# Patient Record
Sex: Male | Born: 1945 | Race: White | Hispanic: No | Smoking: Former smoker
Health system: Southern US, Community
[De-identification: ages and names within clinical notes are randomized; demographics above are authoritative.]

## PROBLEM LIST (undated history)

## (undated) DIAGNOSIS — I351 Nonrheumatic aortic (valve) insufficiency: Secondary | ICD-10-CM

## (undated) DIAGNOSIS — K219 Gastro-esophageal reflux disease without esophagitis: Secondary | ICD-10-CM

## (undated) DIAGNOSIS — K449 Diaphragmatic hernia without obstruction or gangrene: Secondary | ICD-10-CM

## (undated) DIAGNOSIS — I10 Essential (primary) hypertension: Secondary | ICD-10-CM

## (undated) DIAGNOSIS — R011 Cardiac murmur, unspecified: Secondary | ICD-10-CM

## (undated) DIAGNOSIS — F419 Anxiety disorder, unspecified: Secondary | ICD-10-CM

## (undated) DIAGNOSIS — M1990 Unspecified osteoarthritis, unspecified site: Secondary | ICD-10-CM

## (undated) DIAGNOSIS — E785 Hyperlipidemia, unspecified: Secondary | ICD-10-CM

## (undated) DIAGNOSIS — K222 Esophageal obstruction: Secondary | ICD-10-CM

## (undated) DIAGNOSIS — F32A Depression, unspecified: Secondary | ICD-10-CM

## (undated) HISTORY — DX: Depression, unspecified: F32.A

## (undated) HISTORY — PX: UPPER GASTROINTESTINAL ENDOSCOPY: SHX188

## (undated) HISTORY — DX: Diaphragmatic hernia without obstruction or gangrene: K44.9

## (undated) HISTORY — DX: Unspecified osteoarthritis, unspecified site: M19.90

## (undated) HISTORY — PX: COLON SURGERY: SHX602

## (undated) HISTORY — DX: Anxiety disorder, unspecified: F41.9

## (undated) HISTORY — DX: Esophageal obstruction: K22.2

## (undated) HISTORY — DX: Hyperlipidemia, unspecified: E78.5

## (undated) HISTORY — DX: Cardiac murmur, unspecified: R01.1

## (undated) HISTORY — DX: Essential (primary) hypertension: I10

## (undated) HISTORY — PX: POLYPECTOMY: SHX149

## (undated) HISTORY — DX: Gastro-esophageal reflux disease without esophagitis: K21.9

---
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
## 1995-07-05 HISTORY — PX: OTHER SURGICAL HISTORY: SHX169

## 2011-03-15 LAB — BASIC METABOLIC PANEL
Creatinine: 1 mg/dL (ref 0.6–1.3)
Glucose: 79 mg/dL

## 2011-03-15 LAB — CBC AND DIFFERENTIAL
Hemoglobin: 15.4 g/dL (ref 13.5–17.5)
Platelets: 144 10*3/uL — AB (ref 150–399)
WBC: 6.2 10^3/mL

## 2011-03-15 LAB — LIPID PANEL
LDL Cholesterol: 173 mg/dL
Triglycerides: 91 mg/dL (ref 40–160)

## 2011-03-15 LAB — PSA: PSA: 3.2

## 2011-09-30 DIAGNOSIS — H43819 Vitreous degeneration, unspecified eye: Secondary | ICD-10-CM | POA: Diagnosis not present

## 2011-11-07 DIAGNOSIS — L255 Unspecified contact dermatitis due to plants, except food: Secondary | ICD-10-CM | POA: Diagnosis not present

## 2011-11-24 DIAGNOSIS — H43819 Vitreous degeneration, unspecified eye: Secondary | ICD-10-CM | POA: Diagnosis not present

## 2012-06-18 ENCOUNTER — Encounter: Payer: Self-pay | Admitting: Family Medicine

## 2012-06-18 ENCOUNTER — Ambulatory Visit (INDEPENDENT_AMBULATORY_CARE_PROVIDER_SITE_OTHER): Payer: PRIVATE HEALTH INSURANCE | Admitting: Family Medicine

## 2012-06-18 VITALS — BP 153/78 | HR 63 | Ht 70.0 in | Wt 173.0 lb

## 2012-06-18 DIAGNOSIS — E785 Hyperlipidemia, unspecified: Secondary | ICD-10-CM

## 2012-06-18 DIAGNOSIS — R259 Unspecified abnormal involuntary movements: Secondary | ICD-10-CM | POA: Diagnosis not present

## 2012-06-18 DIAGNOSIS — R030 Elevated blood-pressure reading, without diagnosis of hypertension: Secondary | ICD-10-CM

## 2012-06-18 DIAGNOSIS — IMO0001 Reserved for inherently not codable concepts without codable children: Secondary | ICD-10-CM

## 2012-06-18 DIAGNOSIS — R51 Headache: Secondary | ICD-10-CM | POA: Insufficient documentation

## 2012-06-18 DIAGNOSIS — R519 Headache, unspecified: Secondary | ICD-10-CM | POA: Insufficient documentation

## 2012-06-18 DIAGNOSIS — K224 Dyskinesia of esophagus: Secondary | ICD-10-CM

## 2012-06-18 DIAGNOSIS — R252 Cramp and spasm: Secondary | ICD-10-CM | POA: Insufficient documentation

## 2012-06-18 NOTE — Progress Notes (Signed)
CC: Nicholas Murillo is a 66 y.o. male is here for Establish Care   Subjective: HPI:  Sigmoid 2012 Normal PSA 03/2011 Normal  Very pleasant 66 year old here to establish care  Carries a diagnosis of hyperlipidemia: He is unsure of his last lipid panel results but it's been over a year. Tries to watch what he eats, stays very active, denies chest pain, shortness of breath, or limb claudication  Has been told in the past that his blood pressure has been elevated. He is unsure of prior numbers in the past. Has never been on antihypertensives. Denies motor or sensory disturbances, headaches, irregular heartbeat, orthopnea, nor peripheral edema  Complains of a tightness in his chest that occurs only when eating solids, almost with any solid that he eats. Never occurs with fluids. Describes it as a spasm subsides if he drinks a large amount of water quickly. Seems to be coming on more frequently and more noticeable over the past few months. No interventions as of yet .  denies unintentional weight loss, vomiting, nor regurgitation.  Complains of posterior neck pain just below the base of the skull.  Hard to describe how often it occurs. Comes on without warning and lasts for 30 minutes, goes away without any intervention. It is a sharp pain is nonradiating and no larger than the pad of his finger. No other symptoms proceed joint or follow this discomfort. He is unsure how long it's been there  Complains of long-standing cramping in his hands and the medial thighs. Nothing in particular seems to proceed these episodes. The last for matter of minutes. Nothing seems to make them better or worse. No interventions as of yet. Pain is moderate in severity. Occur a few times a week, not daily.   Review of Systems - General ROS: negative for - chills, fever, night sweats, weight gain or weight loss Ophthalmic ROS: negative for - decreased vision Psychological ROS: negative for - anxiety or depression ENT ROS:  negative for - hearing change, nasal congestion, tinnitus or allergies Hematological and Lymphatic ROS: negative for - bleeding problems, bruising or swollen lymph nodes Breast ROS: negative Respiratory ROS: no cough, shortness of breath, or wheezing Cardiovascular ROS: no chest pain or dyspnea on exertion Gastrointestinal ROS: no abdominal pain, change in bowel habits, or black or bloody stools Genito-Urinary ROS: negative for - genital discharge, genital ulcers, incontinence or abnormal bleeding from genitals Musculoskeletal ROS: negative for - joint pain or muscle pain other than that described above Neurological ROS: negative for - headaches or memory loss Dermatological ROS: negative for lumps, mole changes, rash and skin lesion changes  Past Medical History  Diagnosis Date  . Hyperlipidemia      Family History  Problem Relation Age of Onset  . Skin cancer Father      History  Substance Use Topics  . Smoking status: Never Smoker   . Smokeless tobacco: Not on file  . Alcohol Use: No     Objective: Filed Vitals:   06/18/12 1422  BP: 153/78  Pulse: 63    General: Alert and Oriented, No Acute Distress HEENT: Pupils equal, round, reactive to light. Conjunctivae clear.  External ears unremarkable, canals clear with intact TMs with appropriate landmarks.  Middle ear appears open without effusion. Pink inferior turbinates.  Moist mucous membranes, pharynx without inflammation nor lesions.  Neck supple without palpable lymphadenopathy nor abnormal masses. Lungs: Clear to auscultation bilaterally, no wheezing/ronchi/rales.  Comfortable work of breathing. Good air movement. Cardiac: Regular rate and  rhythm. Normal S1/S2.  No murmurs, rubs, nor gallops.   Abdomen: Soft nontender to palpation Extremities: No peripheral edema.  Strong peripheral pulses.  Mental Status: No depression, anxiety, nor agitation. Skin: Warm and dry.  Assessment & Plan: Nicholas Murillo was seen today for establish  care.  Diagnoses and associated orders for this visit:  Hyperlipidemia - Lipid panel  Elevated blood pressure - COMPLETE METABOLIC PANEL WITH GFR  Headache - COMPLETE METABOLIC PANEL WITH GFR  Esophageal spasm - DG Esophagus; Future  Spasm - COMPLETE METABOLIC PANEL WITH GFR    Hyperlipidemia: Getting up-to-date lipid panel Elevated blood pressure: Checking renal function, if elevated next visit we'll discuss antihypertensive medications. Discussed lowering salt intake Headache and muscle spasms: Checking albumin and calcium to rule out calcium disorders as a contributing factor, checking other electrolytes as well. Esophageal spasm: Ordering barium swallow   Return in about 4 weeks (around 07/16/2012) for CPE.

## 2012-06-21 DIAGNOSIS — E785 Hyperlipidemia, unspecified: Secondary | ICD-10-CM | POA: Diagnosis not present

## 2012-06-21 LAB — LIPID PANEL
Cholesterol: 233 mg/dL — ABNORMAL HIGH (ref 0–200)
HDL: 38 mg/dL — ABNORMAL LOW (ref >39)
LDL Cholesterol: 180 mg/dL — ABNORMAL HIGH (ref 0–99)
Total CHOL/HDL Ratio: 6.1 Ratio
Triglycerides: 73 mg/dL (ref <150)
VLDL: 15 mg/dL (ref 0–40)

## 2012-06-21 LAB — COMPLETE METABOLIC PANEL WITH GFR
ALT: 19 U/L (ref 0–53)
AST: 22 U/L (ref 0–37)
Alkaline Phosphatase: 63 U/L (ref 39–117)
BUN: 24 mg/dL — ABNORMAL HIGH (ref 6–23)
Chloride: 106 mEq/L (ref 96–112)
Creat: 0.93 mg/dL (ref 0.50–1.35)
Total Bilirubin: 0.6 mg/dL (ref 0.3–1.2)

## 2012-06-22 ENCOUNTER — Encounter: Payer: Self-pay | Admitting: Family Medicine

## 2012-07-03 ENCOUNTER — Other Ambulatory Visit (HOSPITAL_COMMUNITY): Payer: PRIVATE HEALTH INSURANCE

## 2012-07-05 ENCOUNTER — Encounter: Payer: Self-pay | Admitting: *Deleted

## 2012-07-06 ENCOUNTER — Other Ambulatory Visit (HOSPITAL_COMMUNITY): Payer: PRIVATE HEALTH INSURANCE

## 2012-07-10 ENCOUNTER — Telehealth: Payer: Self-pay | Admitting: Family Medicine

## 2012-07-10 ENCOUNTER — Ambulatory Visit (HOSPITAL_COMMUNITY)
Admission: RE | Admit: 2012-07-10 | Discharge: 2012-07-10 | Disposition: A | Discharge status: Home / Self Care | Payer: Medicare Other | Source: Ambulatory Visit | Attending: Family Medicine | Admitting: Family Medicine

## 2012-07-10 DIAGNOSIS — K449 Diaphragmatic hernia without obstruction or gangrene: Secondary | ICD-10-CM | POA: Insufficient documentation

## 2012-07-10 DIAGNOSIS — K224 Dyskinesia of esophagus: Secondary | ICD-10-CM

## 2012-07-10 DIAGNOSIS — K219 Gastro-esophageal reflux disease without esophagitis: Secondary | ICD-10-CM | POA: Insufficient documentation

## 2012-07-10 DIAGNOSIS — R1310 Dysphagia, unspecified: Secondary | ICD-10-CM | POA: Insufficient documentation

## 2012-07-10 DIAGNOSIS — K222 Esophageal obstruction: Secondary | ICD-10-CM | POA: Insufficient documentation

## 2012-07-10 IMAGING — RF DG ESOPHAGUS
14 of 24 series · 14 of 24 positions shown · non-contrast
Comparison: None.

CLINICAL DATA: Dysphagia with solids.

ESOPHOGRAM/BARIUM SWALLOW
TECHNIQUE: Combined double contrast and single contrast
examination performed using effervescent crystals, thick barium
liquid, and thin barium liquid.
Fluoroscopy time:  2.5 minutes.

[Series 1: run · 1 of 1 slices shown (1 of 14)]
[im 1/1]
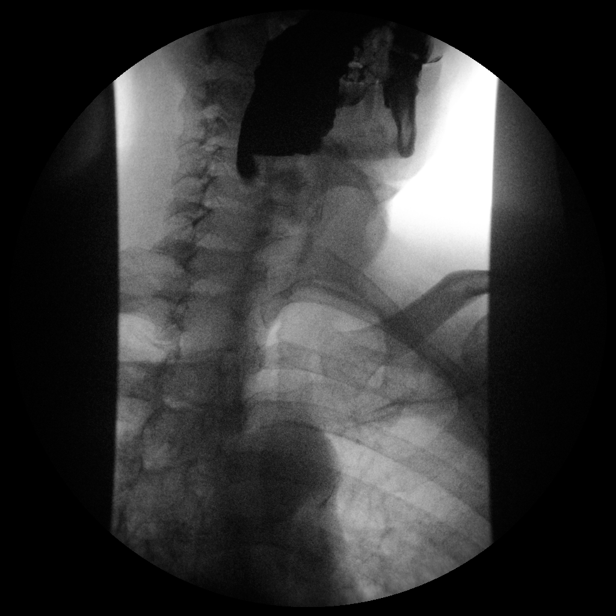

[Series 3: run · 1 of 1 slices shown (2 of 14)]
[im 1/1]
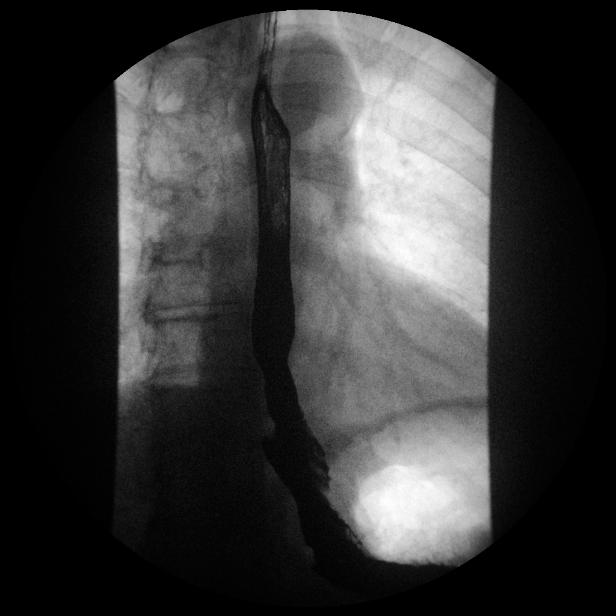

[Series 5: run · 1 of 1 slices shown (3 of 14)]
[im 1/1]
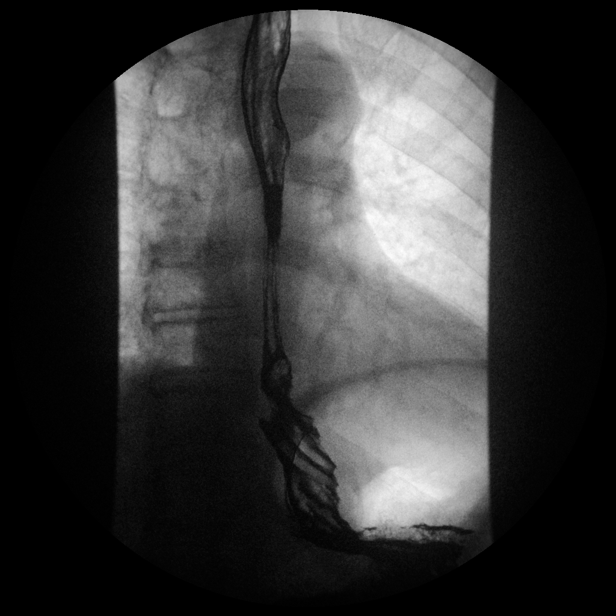

[Series 7: run · 1 of 1 slices shown (4 of 14)]
[im 1/1]
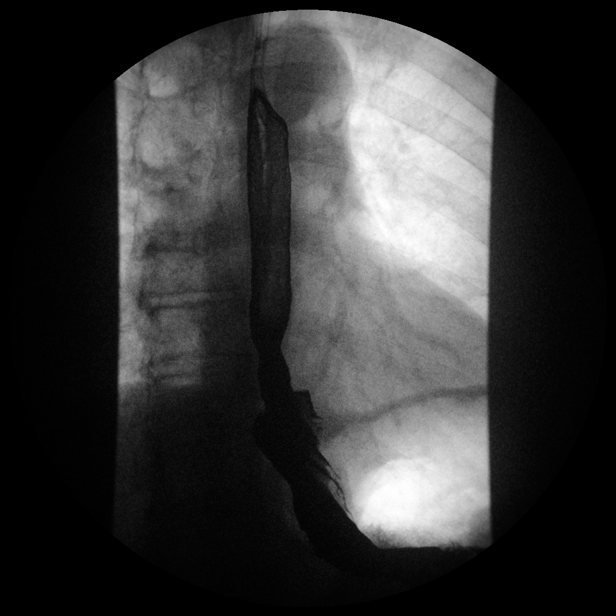

[Series 8: run · 1 of 1 slices shown (5 of 14)]
[im 1/1]
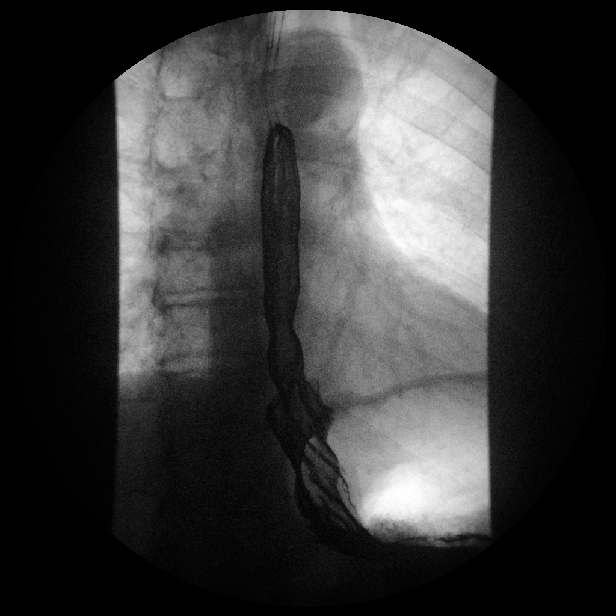

[Series 10: run · 1 of 1 slices shown (6 of 14)]
[im 1/1]
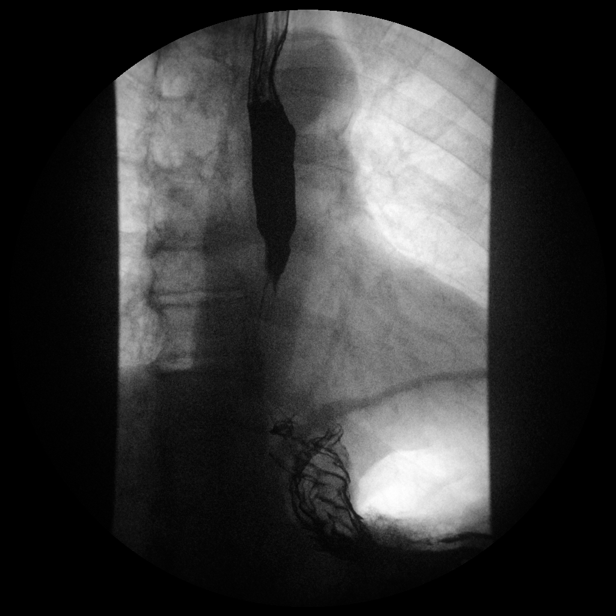

[Series 12: run · 1 of 3 slices shown (7 of 14)]
[im 1/3]
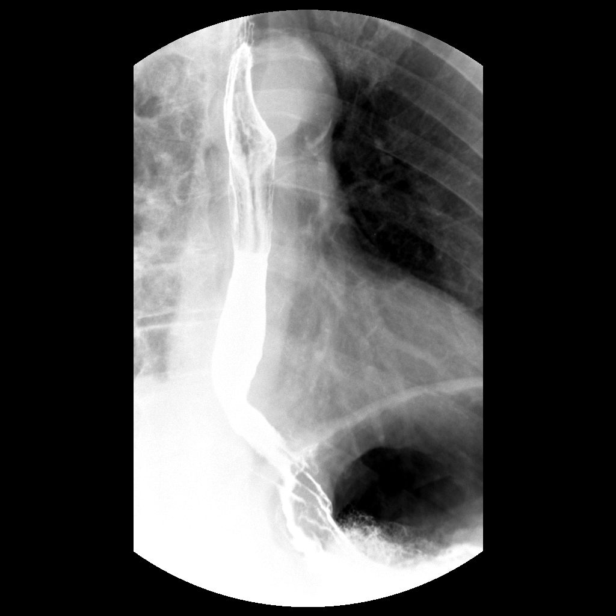

[Series 13: run · 1 of 3 slices shown (8 of 14)]
[im 1/3]
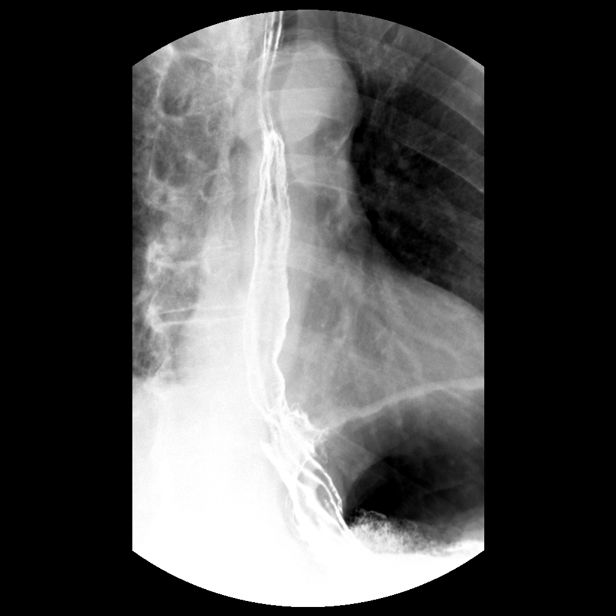

[Series 15: run · 1 of 1 slices shown (9 of 14)]
[im 1/1]
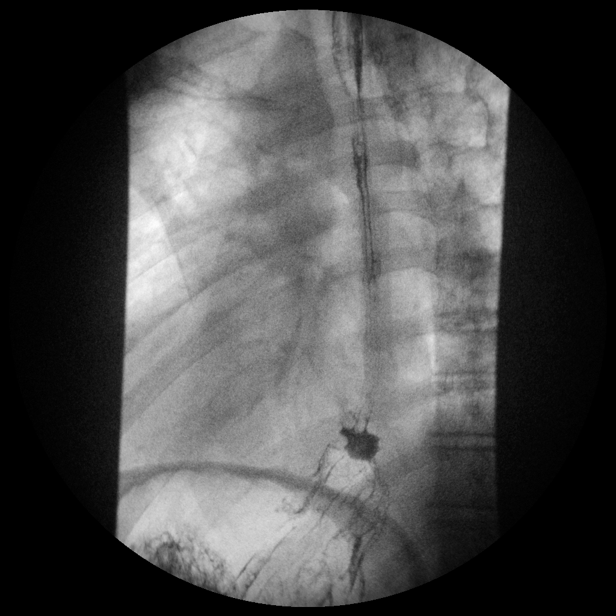

[Series 17: run · 1 of 1 slices shown (10 of 14)]
[im 1/1]
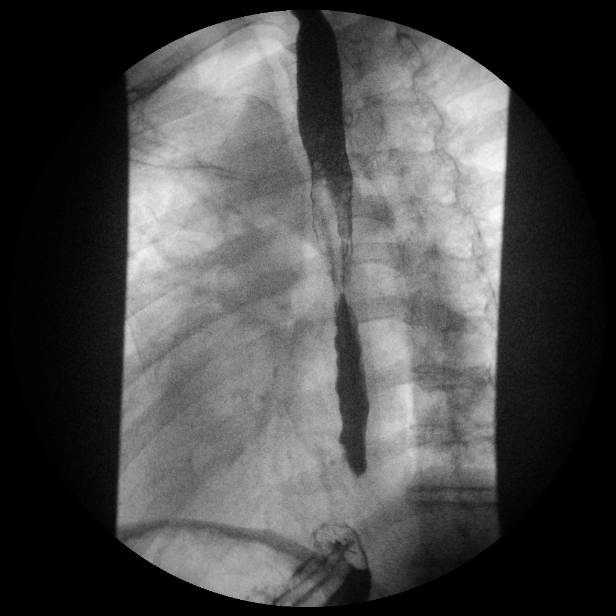

[Series 19: run · 1 of 1 slices shown (11 of 14)]
[im 1/1]
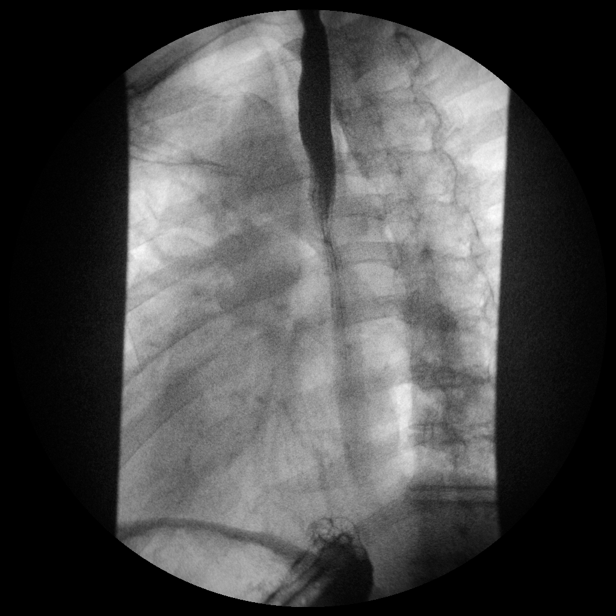

[Series 20: run · 1 of 1 slices shown (12 of 14)]
[im 1/1]
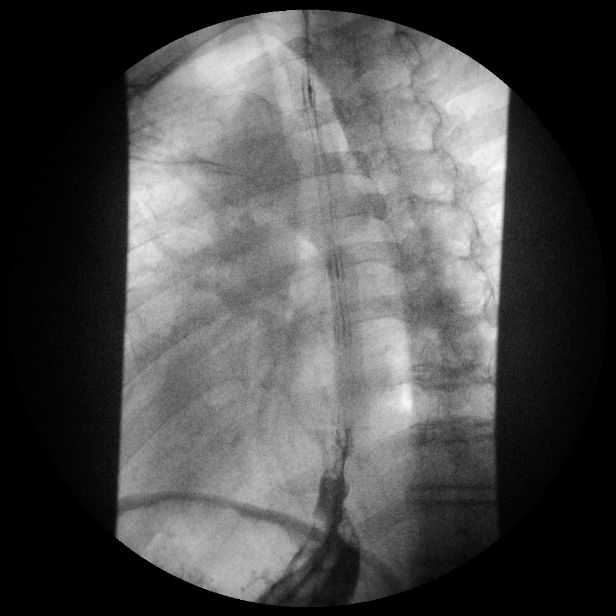

[Series 22: run · 1 of 3 slices shown (13 of 14)]
[im 1/3]
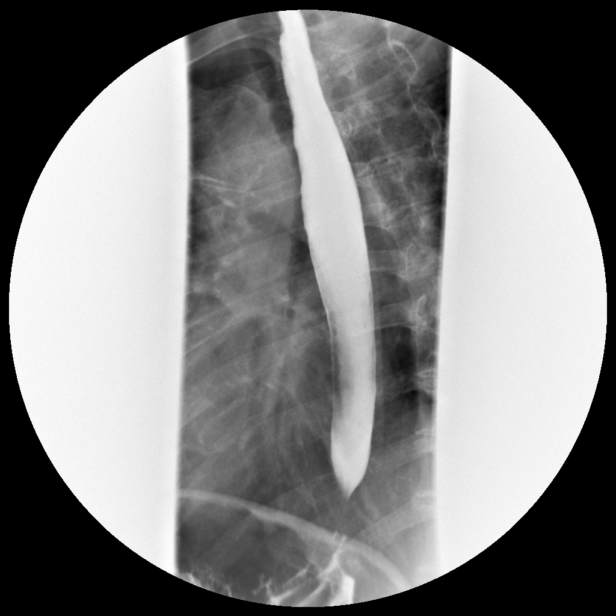

[Series 24: run · 1 of 1 slices shown (14 of 14)]
[im 1/1]
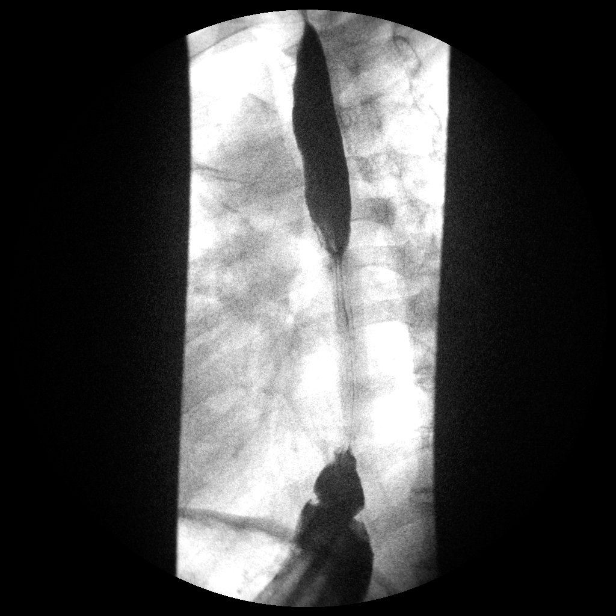

[14 of 24 positions shown; findings below may reference images not displayed]

FINDINGS: Fluoroscopic evaluation of swallowing demonstrates
disruption of [DATE] primary esophageal peristaltic waves.  Occasional
scattered tertiary contractions.

There is a small hiatal hernia.  Within the distal esophagus, there
is an area of mild short segment narrowing just above the hiatal
hernia.  Slight irregular contours and shouldering noted along the
distal aspect of the stricture.

A small amount of reflux was noted with water siphon maneuver.

The patient swallowed a 13 mm barium tablet.  This sticks for 2-3
minutes within the distal esophagus.
IMPRESSION: Small hiatal hernia.

Area of short segment stricture in the distal esophagus.  Question
reflux esophagitis/stricture.  Cannot completely exclude neoplasm.
Recommend direct visualization.

Nonspecific esophageal motility disorder.

Small amount of gastroesophageal reflux.

to Dr. CSIRMAZ, who verbally acknowledged these results.

## 2012-07-10 NOTE — Telephone Encounter (Signed)
Attempted to discuss results, no answer, will continue attempts.

## 2012-07-10 NOTE — Telephone Encounter (Signed)
Patient updated, referral placed

## 2012-07-12 ENCOUNTER — Telehealth: Payer: Self-pay | Admitting: Internal Medicine

## 2012-07-12 NOTE — Telephone Encounter (Signed)
Pts PCP has done some tests and pt may have possible esophageal stricture. Pt scheduled for OV with Amy Esterwood PA 07/16/12@10 :30am. Pt aware of appt date and time.

## 2012-07-13 ENCOUNTER — Ambulatory Visit: Payer: PRIVATE HEALTH INSURANCE | Admitting: Family Medicine

## 2012-07-13 ENCOUNTER — Encounter: Payer: Self-pay | Admitting: Family Medicine

## 2012-07-13 VITALS — BP 116/43 | HR 54 | Ht 70.0 in | Wt 175.0 lb

## 2012-07-13 DIAGNOSIS — IMO0001 Reserved for inherently not codable concepts without codable children: Secondary | ICD-10-CM

## 2012-07-13 DIAGNOSIS — E785 Hyperlipidemia, unspecified: Secondary | ICD-10-CM

## 2012-07-13 NOTE — Progress Notes (Signed)
Patient presents for annual physical exam, unfortunately this visit was scheduled incorrectly, not any fault of his own.  I wanted to talk to him about the following and he will not be charged for this visit due to scheduling error caused by her office   Hyperlipidemia: Framingham risk of 21%, LDL of 180 last week. He is very agreeable to starting a statin, he will look into what the cheapest statin is covered on his insurance plan and will call me to arrange a prescription.  Elevated blood pressure: Normotensive at this visit, he happily states that today's blood pressures reflection of his normal blood pressures  He complains of some cramping that continues in his medial hamstrings I've asked him to try magnesium supplementation,.  We briefly about his mother who has Alzheimer's and was recently placed in a nursing home, things are going much better now

## 2012-07-16 ENCOUNTER — Encounter: Payer: Self-pay | Admitting: Physician Assistant

## 2012-07-16 ENCOUNTER — Ambulatory Visit (INDEPENDENT_AMBULATORY_CARE_PROVIDER_SITE_OTHER): Payer: PRIVATE HEALTH INSURANCE | Admitting: Physician Assistant

## 2012-07-16 VITALS — BP 130/70 | HR 66 | Ht 68.0 in | Wt 173.4 lb

## 2012-07-16 DIAGNOSIS — R1310 Dysphagia, unspecified: Secondary | ICD-10-CM

## 2012-07-16 NOTE — Patient Instructions (Addendum)
You have been scheduled for an endoscopy with propofol. Please follow written instructions given to you at your visit today. If you use inhalers (even only as needed) or a CPAP machine, please bring them with you on the day of your procedure.  If this date for the Endoscopy needs changed, call us at [phone]. Choose option 2 for nurse and the party that answers can reschedule this for you.  We have given you brochures on Esophagitis and strictures and colonoscopy.

## 2012-07-16 NOTE — Progress Notes (Signed)
Agree with initial assessment and plans as outlined 

## 2012-07-16 NOTE — Progress Notes (Signed)
Subjective:    Patient ID: Nicholas Murillo, male    DOB: [birth year], 67 y.o.   MRN: [id number]  HPI Nicholas Murillo is a very nice generally healthy 67 year old white male who is referred by Dr. [name] for evaluation of intermittent dysphagia. Patient underwent barium swallow on 06/18/2012 and was found to have a short segment of narrowing just above a small hiatal hernia there was noted to be some slight shouldering and a barium tablet was delayed for about 3 minutes Patient has not had any prior endoscopic evaluation.  He says that he has had intermittent episodes over the past couple of years and usually states that these occur 6-8 times per year. He says he has had difficulty with meat and one time a banana, never with liquid. He says he swallows fine but on occasion will get a piece of food lodged in his esophagus, he says this is quite painful and he has had to induce regurgitation to try to relieve the obstruction. In between these episodes he is not having any difficulty swallowing. He does have intermittent heartburn and indigestion which he feels is aggravated by stress. He is not on any current PPI therapy He has not had screening colonoscopy, family history is negative for colon cancer and he is currently asymptomatic    Review of Systems  Constitutional: Negative.   HENT: Positive for trouble swallowing.   Eyes: Negative.   Respiratory: Negative.   Cardiovascular: Negative.   Gastrointestinal: Negative.   Genitourinary: Negative.   Musculoskeletal: Negative.   Skin: Negative.   Neurological: Negative.   Hematological: Negative.   Psychiatric/Behavioral: Negative.    Outpatient Prescriptions Prior to Visit  Medication Sig Dispense Refill  . magnesium oxide (MAG-OX) 400 MG tablet Take 800 mg by mouth daily.       Last reviewed on 07/16/2012  1:08 PM by Nicholas Cooper, PA     No Known Allergies Patient Active Problem List  Diagnosis  . Hyperlipidemia LDL goal < 100  . Elevated blood  pressure  . Headache  . Spasm  . Esophageal stricture  . Hiatal hernia   History  Substance Use Topics  . Smoking status: Never Smoker   . Smokeless tobacco: Never Used  . Alcohol Use: No     Comment: quit 20 years ago    family history includes Colon polyps in his mother; Dementia in his mother; and Skin cancer in his father.  There is no history of Colon cancer.    Physical Exam Well-developed white male in no acute distress, pleasant blood pressure 130/70 pulse 66 height 5 foot 8 weeks 173. HEENT; nontraumatic normocephalic EOMI PERRLA sclera anicteric,Neck; Supple; no JVD, Cardiovascula; regular rate and rhythm with S1-S2 no murmur or gallop, Pulmonary; clear bilaterally, Abdomen; soft nontender nondistended bowel sounds active no palpable mass or hepatosplenomegaly, Rectal; exam not done, Extremities; no clubbing cyanosis or edema skin warm dry, Psych; mood and affect normal and appropriate       Assessment & Plan:  #66 67 year old male with intermittent solid food dysphagia and barium swallow consistent with a distal esophageal stricture. #2 colon neoplasia surveillance-patient has not had screening #3 history of hyperlipidemia  Plan; Patient is scheduled for upper endoscopy with probable Savary dilation with Dr. [name]. Procedure was discussed in detail with the patient and he is agreeable to proceed. He may benefit from PPI therapy but will wait for results of EGD, and can start at that time Also discussed benefits of screening colonoscopy. He states  that he is scheduled for a  Physical within the next few weeks will discuss with his primary care physician and then plan to call back here for direct scheduling

## 2012-07-18 ENCOUNTER — Encounter: Payer: Self-pay | Admitting: Family Medicine

## 2012-07-18 ENCOUNTER — Ambulatory Visit (INDEPENDENT_AMBULATORY_CARE_PROVIDER_SITE_OTHER): Payer: Medicare Other | Admitting: Family Medicine

## 2012-07-18 VITALS — BP 120/63 | HR 65 | Ht 68.0 in | Wt 173.0 lb

## 2012-07-18 DIAGNOSIS — Z23 Encounter for immunization: Secondary | ICD-10-CM

## 2012-07-18 DIAGNOSIS — Z0000 Encounter for general adult medical examination without abnormal findings: Secondary | ICD-10-CM

## 2012-07-18 MED ORDER — ZOSTER VACCINE LIVE 19400 UNT/0.65ML ~~LOC~~ SOLR: 0.6500 mL | Freq: Once | SUBCUTANEOUS | Status: DC

## 2012-07-18 MED ORDER — SIMVASTATIN 20 MG PO TABS: 20.0000 mg | ORAL_TABLET | Freq: Every evening | ORAL | Status: DC

## 2012-07-18 NOTE — Progress Notes (Signed)
Subjective:    Nicholas Murillo is a 67 y.o. male who presents for Medicare Annual/Subsequent preventive examination.   Preventive Screening-Counseling & Management  Tobacco History  Smoking status  . Never Smoker   Smokeless tobacco  . Never Used    Problems Prior to Visit 1. hyperlipidemia, esophageal stricture  Current Problems (verified) Patient Active Problem List  Diagnosis  . Hyperlipidemia LDL goal < 100  . Elevated blood pressure  . Headache  . Spasm  . Esophageal stricture  . Hiatal hernia    Medications Prior to Visit Current Outpatient Prescriptions on File Prior to Visit  Medication Sig Dispense Refill  . magnesium oxide (MAG-OX) 400 MG tablet Take 800 mg by mouth daily.      . simvastatin (ZOCOR) 20 MG tablet Take 1 tablet (20 mg total) by mouth every evening.  90 tablet  3    Current Medications (verified) Current Outpatient Prescriptions  Medication Sig Dispense Refill  . magnesium oxide (MAG-OX) 400 MG tablet Take 800 mg by mouth daily.      . simvastatin (ZOCOR) 20 MG tablet Take 1 tablet (20 mg total) by mouth every evening.  90 tablet  3  . zoster vaccine live, PF, (ZOSTAVAX) 16109 UNT/0.65ML injection Inject 19,400 Units into the skin once.  1 each  0  . zoster vaccine live, PF, (ZOSTAVAX) 60454 UNT/0.65ML injection Inject 19,400 Units into the skin once.  1 each  0     Allergies (verified) Review of patient's allergies indicates no known allergies.   PAST HISTORY  Family History Family History  Problem Relation Age of Onset  . Skin cancer Father   . Colon polyps Mother     benign  . Colon cancer Neg Hx   . Dementia Mother     Social History History  Substance Use Topics  . Smoking status: Never Smoker   . Smokeless tobacco: Never Used  . Alcohol Use: No     Comment: quit 20 years ago    Are there smokers in your home (other than you)?  No  Risk Factors Current exercise habits: Home exercise routine includes Walking.  Dietary  issues discussed: Low-salt diet with moderation of fat and cholesterol intake with benefits of fruits and vegetables included every meal  Cardiac risk factors: advanced age (older than 88 for men, 33 for women), dyslipidemia and male gender.  Depression Screen (Note: if answer to either of the following is "Yes", a more complete depression screening is indicated)   Q1: Over the past two weeks, have you felt down, depressed or hopeless? No  Q2: Over the past two weeks, have you felt little interest or pleasure in doing things? No  Have you lost interest or pleasure in daily life? No  Do you often feel hopeless? No  Do you cry easily over simple problems? No  Activities of Daily Living In your present state of health, do you have any difficulty performing the following activities?:  Driving? No Managing money?  No Feeding yourself? No Getting from bed to chair? No Climbing a flight of stairs? No Preparing food and eating?: No Bathing or showering? No Getting dressed: No Getting to the toilet? No Using the toilet:No Moving around from place to place: No In the past year have you fallen or had a near fall?:No   Are you sexually active?  Yes  Do you have more than one partner?  No  Hearing Difficulties: No Do you often ask people to speak up or  repeat themselves? No Do you experience ringing or noises in your ears? No Do you have difficulty understanding soft or whispered voices? No   Do you feel that you have a problem with memory? No  Do you often misplace items? Yes  Do you feel safe at home?  Yes  Cognitive Testing  Alert? Yes  Normal Appearance?Yes  Oriented to person? Yes  Place? Yes   Time? Yes  Recall of three objects?  Yes  Can perform simple calculations? Yes  Displays appropriate judgment?Yes  Can read the correct time from a watch face?Yes   Advanced Directives have been discussed with the patient? Yes   List the Names of Other Physician/Practitioners you  currently use: 1.  see snapshot, him he is to repeat a  Indicate any recent Medical Services you may have received from other than [facility] providers in the past year (date may be approximate).  Immunization History  Administered Date(s) Administered  . Pneumococcal Polysaccharide 07/18/2012   he believes he has had a tetanus booster well within the last 10 years  Screening Tests Health Maintenance  Topic Date Due  . Tetanus/tdap  11/13/1964  . Colonoscopy  11/14/1995  . Zostavax  11/13/2005  . Pneumococcal Polysaccharide Vaccine Age 92 And Over  11/14/2010  . Influenza Vaccine  03/04/2012    All answers were reviewed with the patient and necessary referrals were made:  Laren Boom, DO   07/18/2012   History reviewed: allergies, current medications, past family history, past medical history, past social history, past surgical history and problem list  Review of Systems Review of Systems - General ROS: negative for - chills, fever, night sweats, weight gain or weight loss Ophthalmic ROS: negative for - decreased vision Psychological ROS: negative for - anxiety or depression ENT ROS: negative for - hearing change, nasal congestion, tinnitus or allergies Hematological and Lymphatic ROS: negative for - bleeding problems, bruising or swollen lymph nodes Breast ROS: negative Respiratory ROS: no cough, shortness of breath, or wheezing Cardiovascular ROS: no chest pain or dyspnea on exertion Gastrointestinal ROS: no abdominal pain, change in bowel habits, or black or bloody stools Genito-Urinary ROS: negative for - genital discharge, genital ulcers, incontinence or abnormal bleeding from genitals Musculoskeletal ROS: negative for - joint pain or muscle pain Neurological ROS: negative for - headaches or memory loss Dermatological ROS: negative for lumps, mole changes, rash and skin lesion changes  Objective:     Vision by Snellen chart: right eye:20/20, left eye:20/20 Blood pressure 120/63,  pulse 65, height 5\' 8"  (1.727 m), weight 173 lb (78.472 kg). Body mass index is 26.30 kg/(m^2).  General: No Acute Distress HEENT: Atraumatic, normocephalic, conjunctivae normal without scleral icterus.  No nasal discharge, hearing grossly intact, TMs with good landmarks bilaterally with no middle ear abnormalities, posterior pharynx clear without oral lesions. Neck: Supple, trachea midline, no cervical nor supraclavicular adenopathy. Pulmonary: Clear to auscultation bilaterally without wheezing, rhonchi, nor rales. Cardiac: Regular rate and rhythm.  No murmurs, rubs, nor gallops. No peripheral edema.  2+ peripheral pulses bilaterally. Abdomen: Bowel sounds normal.  No masses.  Non-tender without rebound.  Negative Murphy's sign. MSK: Grossly intact, no signs of weakness.  Full strength throughout upper and lower extremities.  Full ROM in upper and lower extremities.  No midline spinal tenderness. Neuro: Gait unremarkable, CN II-XII grossly intact.  C5-C6 Reflex 2/4 Bilaterally, L4 Reflex 2/4 Bilaterally.  Cerebellar function intact. Skin: No rashes. Psych: Alert and oriented to person/place/time.  Thought process normal. No  anxiety/depression.     Assessment:     Normal physical exam, behind on immunizations     Plan:     During the course of the visit the patient was educated and counseled about appropriate screening and preventive services including:    Pneumococcal vaccine   Influenza vaccine  Nutrition counseling   Advanced directives: Patient was given handout on how to complete this with his wife Zostavax and influenza vaccine at his pharmacy Diet review for nutrition referral? Not indicated   Patient Instructions (the written plan) was given to the patient.  Medicare Attestation I have personally reviewed: The patient's medical and social history Their use of alcohol, tobacco or illicit drugs Their current medications and supplements The patient's functional  ability including ADLs,fall risks, home safety risks, cognitive, and hearing and visual impairment Diet and physical activities Evidence for depression or mood disorders  The patient's weight, height, BMI, and visual acuity have been recorded in the chart.  I have made referrals, counseling, and provided education to the patient based on review of the above and I have provided the patient with a written personalized care plan for preventive services.     Laren Boom, DO   07/18/2012

## 2012-07-18 NOTE — Patient Instructions (Signed)
Dr. [name]'s General Advice Following Your Complete Physical Exam  The Benefits of Regular Exercise: Unless you suffer from an uncontrolled cardiovascular condition, studies strongly suggest that regular exercise and physical activity will add to both the quality and length of your life.  The World Health Organization recommends 150 minutes of moderate intensity aerobic activity every week.  This is best split over 3-4 days a week, and can be as simple as a brisk walk for just over 35 minutes "most days of the week".  This type of exercise has been shown to lower LDL-Cholesterol, lower average blood sugars, lower blood pressure, lower cardiovascular disease risk, improve memory, and increase one's overall sense of wellbeing.  The addition of anaerobic (or "strength training") exercises offers additional benefits including but not limited to increased metabolism, prevention of osteoporosis, and improved overall cholesterol levels.  How Can I Strive For A Low-Fat Diet?: Current guidelines recommend that 25-35 percent of your daily energy (food) intake should come from fats.  One might ask how can this be achieved without having to dissect each meal on a daily basis?  Switch to skim or 1% milk instead of whole milk.  Focus on lean meats such as ground turkey, fresh fish, baked chicken, and lean cuts of beef as your source of dietary protein.  Consume less than 300mg/day of dietary cholesterol.  Limit trans fatty acid consumption primarily by limiting synthetic trans fats such as partially hydrogenated oils (Ex: fried fast foods).  Focus efforts on reducing your intake of "solid" fats (Ex: Butter).  Substitute olive or vegetable oil for solid fats where possible.  Moderation of Salt Intake: Provided you don't carry a diagnosis of congestive heart failure nor renal failure, I recommend a daily allowance of no more than 2300 mg of salt (sodium).  Keeping under this daily goal is associated with a  decreased risk of cardiovascular events, creeping above it can lead to elevated blood pressures and increases your risk of cardiovascular events.  Milligrams (mg) of salt is listed on all nutrition labels, and your daily intake can add up faster than you think.  Most canned and frozen dinners can pack in over half your daily salt allowance in one meal.    Lifestyle Health Risks: Certain lifestyle choices carry specific health risks.  As you may already know, tobacco use has been associated with increasing one's risk of cardiovascular disease, pulmonary disease, numerous cancers, among many other issues.  What you may not know is that there are medications and nicotine replacement strategies that can more than double your chances of successfully quitting.  I would be thrilled to help manage your quitting strategy if you currently use tobacco products.  When it comes to alcohol use, I've yet to find an "ideal" daily allowance.  Provided an individual does not have a medical condition that is exacerbated by alcohol consumption, general guidelines determine "safe drinking" as no more than two standard drinks for a man or no more than one standard drink for a male per day.  However, much debate still exists on whether any amount of alcohol consumption is technically "safe".  My general advice, keep alcohol consumption to a minimum for general health promotion.  If you or others believe that alcohol, tobacco, or recreational drug use is interfering with your life, I would be happy to provide confidential counseling regarding treatment options.  General "Over The Counter" Nutrition Advice: Postmenopausal women should aim for a daily calcium intake of 1200 mg, however a significant   portion of this might already be provided by diets including milk, yogurt, cheese, and other dairy products.  Vitamin D has been shown to help preserve bone density, prevent fatigue, and has even been shown to help reduce falls in the  elderly.  Ensuring a daily intake of 800 Units of Vitamin D is a good place to start to enjoy the above benefits, we can easily check your Vitamin D level to see if you'd potentially benefit from supplementation beyond 800 Units a day.  Folic Acid intake should be of particular concern to women of childbearing age.  Daily consumption of 400-800 mcg of Folic Acid is recommended to minimize the chance of spinal cord defects in a fetus should pregnancy occur.    For many adults, accidents still remain one of the most common culprits when it comes to cause of death.  Some of the simplest but most effective preventitive habits you can adopt include regular seatbelt use, proper helmet use, securing firearms, and regularly testing your smoke and carbon monoxide detectors.  Nicholas Murillo B. Nicholas Bankert DO [facility] [address], [address] [place], [place] [address] Phone: [phone]  

## 2012-07-25 ENCOUNTER — Encounter: Payer: Medicare Other | Admitting: Internal Medicine

## 2012-08-18 ENCOUNTER — Other Ambulatory Visit: Payer: Self-pay

## 2012-09-03 ENCOUNTER — Encounter: Payer: Self-pay | Admitting: Internal Medicine

## 2012-09-03 ENCOUNTER — Ambulatory Visit (AMBULATORY_SURGERY_CENTER): Payer: PRIVATE HEALTH INSURANCE | Admitting: Internal Medicine

## 2012-09-03 VITALS — BP 111/64 | HR 51 | Temp 97.6°F | Resp 20 | Ht 68.0 in | Wt 173.0 lb

## 2012-09-03 DIAGNOSIS — K2100 Gastro-esophageal reflux disease with esophagitis, without bleeding: Secondary | ICD-10-CM

## 2012-09-03 DIAGNOSIS — R1310 Dysphagia, unspecified: Secondary | ICD-10-CM | POA: Diagnosis not present

## 2012-09-03 DIAGNOSIS — K222 Esophageal obstruction: Secondary | ICD-10-CM

## 2012-09-03 MED ORDER — OMEPRAZOLE 20 MG PO CPDR: 20.0000 mg | DELAYED_RELEASE_CAPSULE | Freq: Every day | ORAL | Status: DC

## 2012-09-03 MED ORDER — SODIUM CHLORIDE 0.9 % IV SOLN: 500.0000 mL | INTRAVENOUS | Status: DC

## 2012-09-03 NOTE — Progress Notes (Signed)
No complaints noted in the recovery room. Maw  Patient did not experience any of the following events: a burn prior to discharge; a fall within the facility; wrong site/side/patient/procedure/implant event; or a hospital transfer or hospital admission upon discharge from the facility. (G8907) Patient did not have preoperative order for IV antibiotic SSI prophylaxis. (G8918)  

## 2012-09-03 NOTE — Progress Notes (Signed)
Called to room to assist during endoscopic procedure.  Patient ID and intended procedure confirmed with present staff. Received instructions for my participation in the procedure from the performing physician.  

## 2012-09-03 NOTE — Patient Instructions (Addendum)
Handouts were given to your care partner on esophagitis with stricture and dilatation diet.  Please follow the dilatation diet the rest of the day.  Per Dr. Marina Goodell take omeprazole daily 20-30 mins before breakfast.  The rx was sent to [facility] in [place], [place].  You may resume your current medications today.  Please call the office within the next couple days to set up an appointment with Dr. Marina Goodell for 6 weeks.  Call if any questions or concerns.    YOU HAD AN ENDOSCOPIC PROCEDURE TODAY AT THE [facility]: Refer to the procedure report that was given to you for any specific questions about what was found during the examination.  If the procedure report does not answer your questions, please call your gastroenterologist to clarify.  If you requested that your care partner not be given the details of your procedure findings, then the procedure report has been included in a sealed envelope for you to review at your convenience later.  YOU SHOULD EXPECT: Some feelings of bloating in the abdomen. Passage of more gas than usual.  Walking can help get rid of the air that was put into your GI tract during the procedure and reduce the bloating. If you had a lower endoscopy (such as a colonoscopy or flexible sigmoidoscopy) you may notice spotting of blood in your stool or on the toilet paper. If you underwent a bowel prep for your procedure, then you may not have a normal bowel movement for a few days.  DIET: Drink plenty of fluids but you should avoid alcoholic beverages for 24 hours.  Please follow the dilatation diet the rest of the day.  ACTIVITY: Your care partner should take you home directly after the procedure.  You should plan to take it easy, moving slowly for the rest of the day.  You can resume normal activity the day after the procedure however you should NOT DRIVE or use heavy machinery for 24 hours (because of the sedation medicines used during the test).    SYMPTOMS TO REPORT  IMMEDIATELY: A gastroenterologist can be reached at any hour.  During normal business hours, 8:30 AM to 5:00 PM Monday through Friday, call [phone].  After hours and on weekends, please call the GI answering service at [phone] who will take a message and have the physician on call contact you.     Following upper endoscopy (EGD)  Vomiting of blood or coffee ground material  New chest pain or pain under the shoulder blades  Painful or persistently difficult swallowing  New shortness of breath  Fever of 100F or higher  Black, tarry-looking stools  FOLLOW UP: If any biopsies were taken you will be contacted by phone or by letter within the next 1-3 weeks.  Call your gastroenterologist if you have not heard about the biopsies in 3 weeks.  Our staff will call the home number listed on your records the next business day following your procedure to check on you and address any questions or concerns that you may have at that time regarding the information given to you following your procedure. This is a courtesy call and so if there is no answer at the home number and we have not heard from you through the emergency physician on call, we will assume that you have returned to your regular daily activities without incident.  SIGNATURES/CONFIDENTIALITY: You and/or your care partner have signed paperwork which will be entered into your electronic medical record.  These signatures attest  to the fact that that the information above on your After Visit Summary has been reviewed and is understood.  Full responsibility of the confidentiality of this discharge information lies with you and/or your care-partner. 

## 2012-09-03 NOTE — Op Note (Signed)
 [facility] [address] [place], [address]   ENDOSCOPY PROCEDURE REPORT  PATIENT: Nicholas Murillo, Nicholas Murillo  MR#: [id number] BIRTHDATE: 1945-12-30 , 66  yrs. old GENDER: Male ENDOSCOPIST: Roxy Cedar, MD REFERRED BY:  .   Office (Dr Ivan Anchors) PROCEDURE DATE:  09/03/2012 PROCEDURE:  EGD, diagnostic and Maloney dilation of esophagus - 12F ASA CLASS:     Class II INDICATIONS:  Therapeutic procedure.   Dysphagia.   abnormal UGI series results. MEDICATIONS: MAC sedation, administered by CRNA and propofol (Diprivan) 250mg  IV TOPICAL ANESTHETIC: none  DESCRIPTION OF PROCEDURE: After the risks benefits and alternatives of the procedure were thoroughly explained, informed consent was obtained.  The LB-GIF Q180 [id number] endoscope was introduced through the mouth and advanced to the second portion of the duodenum. Without limitations.  The instrument was slowly withdrawn as the mucosa was fully examined.      EXAM:52mm ring-like distal esophageal stricture with associated esophagitis (edema / mild friability).  The stomach and duodenum were normal.  Retroflexed views revealed a hiatal hernia.     The scope was then withdrawn from the patient and the procedure completed.  THERAPY: 12F MALONEY DILATOR PASSED WITHOUT RESITANCE OR HEME. TOLERATED WELL  COMPLICATIONS: There were no complications. ENDOSCOPIC IMPRESSION: 1. Distal esophageal stricture with associated esophagitis (edema / mild friability). S/P DILATION 2. The stomach and duodenum were normal  RECOMMENDATIONS: 1.  Clear liquids until 5PM, then soft foods rest of day.  Resume prior diet tomorrow. 2.  PPI qam... OMEPRAZOLE 20 MG PO Q AM; #30; 11 REFILLS 3.  Call office next 2-3 days to schedule an office appointment for 6 weeks  REPEAT EXAM:  eSigned:  Roxy Cedar, MD 09/03/2012 3:18 PM  CC:The Patient  ; Laren Boom, MD

## 2012-09-04 ENCOUNTER — Telehealth: Payer: Self-pay | Admitting: *Deleted

## 2012-09-04 NOTE — Telephone Encounter (Signed)
  Follow up Call-  Call back number 09/03/2012  Post procedure Call Back phone  # [phone]  Permission to leave phone message Yes     Patient questions:  Do you have a fever, pain , or abdominal swelling? no Pain Score  0 *  Have you tolerated food without any problems? yes  Have you been able to return to your normal activities? yes  Do you have any questions about your discharge instructions: Diet   no Medications  no Follow up visit  no  Do you have questions or concerns about your Care? no  Actions: * If pain score is 4 or above: No action needed, pain <4.

## 2012-10-15 ENCOUNTER — Ambulatory Visit (INDEPENDENT_AMBULATORY_CARE_PROVIDER_SITE_OTHER): Payer: Medicare Other | Admitting: Internal Medicine

## 2012-10-15 ENCOUNTER — Encounter: Payer: Self-pay | Admitting: Internal Medicine

## 2012-10-15 ENCOUNTER — Encounter: Payer: Self-pay | Admitting: Family Medicine

## 2012-10-15 VITALS — BP 120/72 | HR 68 | Ht 68.0 in | Wt 175.0 lb

## 2012-10-15 DIAGNOSIS — K222 Esophageal obstruction: Secondary | ICD-10-CM

## 2012-10-15 DIAGNOSIS — K219 Gastro-esophageal reflux disease without esophagitis: Secondary | ICD-10-CM | POA: Diagnosis not present

## 2012-10-15 NOTE — Progress Notes (Signed)
HISTORY OF PRESENT ILLNESS:  Nicholas Murillo is a 67 y.o. male who was evaluated in the office 07/16/2012 regarding dysphagia and an abnormal barium esophagram. Subsequent upper endoscopy performed 09/03/2012 revealed distal esophageal stricture with esophagitis. He was dilated with a 54 [place] Maloney dilator and placed on omeprazole 20 mg daily. He presents today for followup as requested. Patient is delighted to report that he has absolutely no reflux symptoms or dysphagia. He is extremely happy. Tolerating his medication well without issues. GI review of systems is negative  REVIEW OF SYSTEMS:  All non-GI ROS negative except for cramps  Past Medical History  Diagnosis Date  . Hyperlipidemia   . Hiatal hernia   . Esophageal stricture     Past Surgical History  Procedure Laterality Date  . Appendectomy  1960  . Arterial catheter  1997    Social History [name]  reports that he has never smoked. He has never used smokeless tobacco. He reports that he does not drink alcohol or use illicit drugs.  family history includes Colon polyps in his mother; Dementia in his mother; and Skin cancer in his father.  There is no history of Colon cancer, and Stomach cancer, and Esophageal cancer, .  No Known Allergies     PHYSICAL EXAMINATION: Vital signs: BP 120/72  Pulse 68  Ht 5\' 8"  (1.727 m)  Wt 175 lb (79.379 kg)  BMI 26.61 kg/m2 General: Well-developed, well-nourished, no acute distress Abdomen: Not reexamined. Psychiatric: alert and oriented x3. Cooperative   ASSESSMENT:  #1. GERD complicated by peptic stricture. Status post esophageal dilation. Currently asymptomatic post dilation on PPI #2. Screening colonoscopy recommended. Patient is aware. He will discuss this with his PCP and can schedule a direct exam if he decides to follow through.   PLAN:  #1. Reflux precautions #2. Continue PPI #3. Patient welcome to schedule direct screening colonoscopy as outlined above. #4.  Routine GI followup for GERD in 1 year. Sooner if needed

## 2012-10-15 NOTE — Patient Instructions (Addendum)
Please follow up with Dr. Perry as needed 

## 2012-10-17 ENCOUNTER — Ambulatory Visit (INDEPENDENT_AMBULATORY_CARE_PROVIDER_SITE_OTHER): Payer: Medicare Other | Admitting: Family Medicine

## 2012-10-17 ENCOUNTER — Encounter: Payer: Self-pay | Admitting: Family Medicine

## 2012-10-17 VITALS — BP 116/60 | HR 69 | Ht 70.0 in | Wt 175.0 lb

## 2012-10-17 DIAGNOSIS — E785 Hyperlipidemia, unspecified: Secondary | ICD-10-CM

## 2012-10-17 DIAGNOSIS — I10 Essential (primary) hypertension: Secondary | ICD-10-CM | POA: Diagnosis not present

## 2012-10-17 DIAGNOSIS — Z79899 Other long term (current) drug therapy: Secondary | ICD-10-CM | POA: Diagnosis not present

## 2012-10-17 DIAGNOSIS — Z5181 Encounter for therapeutic drug level monitoring: Secondary | ICD-10-CM

## 2012-10-17 NOTE — Progress Notes (Signed)
CC: Nicholas Murillo is a 67 y.o. male is here for Hypertension   Subjective: HPI:  Nicholas Murillo presents for followup without complaints  Essential hypertension: Since his first visit his blood pressure continues to be normotensive without need for antihypertensives. He continues to stay active, tries to watch what he eats, sticks to a low-salt diet. Denies chest pain, shortness of breath, orthopnea, motor or sensory disturbances, lightheadedness,  Followup hyperlipidemia: Approximately 3 months ago he was started on simvastatin. Since then denies right upper quadrant pain, skin or scleral discoloration, nor myalgias. Denies clinical indication      Review Of Systems Outlined In HPI  Past Medical History  Diagnosis Date  . Hyperlipidemia   . Hiatal hernia   . Esophageal stricture      Family History  Problem Relation Age of Onset  . Skin cancer Father   . Colon polyps Mother     benign  . Dementia Mother   . Colon cancer Neg Hx   . Stomach cancer Neg Hx   . Esophageal cancer Neg Hx      History  Substance Use Topics  . Smoking status: Never Smoker   . Smokeless tobacco: Never Used  . Alcohol Use: No     Comment: quit 20 years ago     Objective: Filed Vitals:   10/17/12 1529  BP: 116/60  Pulse: 69    General: Alert and Oriented, No Acute Distress HEENT: Pupils equal, round, reactive to light. Conjunctivae clear.  Moist membranes  Lungs: Clear to auscultation bilaterally, no wheezing/ronchi/rales.  Comfortable work of breathing. Good air movement. Cardiac: Regular rate and rhythm. Normal S1/S2.  No murmurs, rubs, nor gallops.   Extremities: No peripheral edema.  Strong peripheral pulses.  Mental Status: No depression, anxiety, nor agitation. Skin: Warm and dry.  Assessment & Plan: Nicholas Murillo was seen today for hypertension.  Diagnoses and associated orders for this visit:  Hyperlipidemia LDL goal < 100 - Lipid panel  Essential hypertension, benign  Encounter for  monitoring statin therapy - COMPLETE METABOLIC PANEL WITH GFR    Essential hypertension: Controlled, continue lifestyle interventions alone Hyperlipidemia: Clinically controlled, due for LDL recheck. We'll adjust medications as needed  Return in about 3 months (around 01/16/2013).           Nicholas Murillo presents for followup without complaints

## 2012-10-22 DIAGNOSIS — E785 Hyperlipidemia, unspecified: Secondary | ICD-10-CM | POA: Diagnosis not present

## 2012-10-22 LAB — LIPID PANEL
Cholesterol: 169 mg/dL (ref 0–200)
HDL: 47 mg/dL (ref >39)
Total CHOL/HDL Ratio: 3.6 Ratio
Triglycerides: 56 mg/dL (ref <150)
VLDL: 11 mg/dL (ref 0–40)

## 2012-10-22 LAB — COMPLETE METABOLIC PANEL WITH GFR
Albumin: 4.2 g/dL (ref 3.5–5.2)
Alkaline Phosphatase: 67 U/L (ref 39–117)
BUN: 30 mg/dL — ABNORMAL HIGH (ref 6–23)
Creat: 0.97 mg/dL (ref 0.50–1.35)
GFR, Est Non African American: 81 mL/min
Glucose, Bld: 96 mg/dL (ref 70–99)
Total Bilirubin: 1 mg/dL (ref 0.3–1.2)

## 2012-10-23 ENCOUNTER — Encounter: Payer: Self-pay | Admitting: Family Medicine

## 2012-10-23 DIAGNOSIS — E785 Hyperlipidemia, unspecified: Secondary | ICD-10-CM | POA: Insufficient documentation

## 2012-12-20 DIAGNOSIS — H2510 Age-related nuclear cataract, unspecified eye: Secondary | ICD-10-CM | POA: Diagnosis not present

## 2013-02-07 ENCOUNTER — Encounter: Payer: Self-pay | Admitting: *Deleted

## 2013-02-07 ENCOUNTER — Emergency Department (INDEPENDENT_AMBULATORY_CARE_PROVIDER_SITE_OTHER)
Admission: EM | Admit: 2013-02-07 | Discharge: 2013-02-07 | Disposition: A | Discharge status: Home / Self Care | Payer: Medicare Other | Source: Home / Self Care | Attending: Family Medicine | Admitting: Family Medicine

## 2013-02-07 DIAGNOSIS — S61409A Unspecified open wound of unspecified hand, initial encounter: Secondary | ICD-10-CM | POA: Diagnosis not present

## 2013-02-07 DIAGNOSIS — S51809A Unspecified open wound of unspecified forearm, initial encounter: Secondary | ICD-10-CM | POA: Diagnosis not present

## 2013-02-07 DIAGNOSIS — Z23 Encounter for immunization: Secondary | ICD-10-CM

## 2013-02-07 DIAGNOSIS — S61412A Laceration without foreign body of left hand, initial encounter: Secondary | ICD-10-CM

## 2013-02-07 DIAGNOSIS — S51832A Puncture wound without foreign body of left forearm, initial encounter: Secondary | ICD-10-CM

## 2013-02-07 MED ORDER — CEPHALEXIN 500 MG PO CAPS: 500.0000 mg | ORAL_CAPSULE | Freq: Two times a day (BID) | ORAL | Status: DC

## 2013-02-07 MED ORDER — TETANUS-DIPHTH-ACELL PERTUSSIS 5-2.5-18.5 LF-MCG/0.5 IM SUSP
0.5000 mL | Freq: Once | INTRAMUSCULAR | Status: AC
  Administered 2013-02-07: 0.5 mL via INTRAMUSCULAR

## 2013-02-07 NOTE — ED Notes (Signed)
Nicholas Murillo reports falling off of his ladder on a wood pile that had 2 nails sticking up. Puncture wound to left hand and LFA. Tetanus <7years.

## 2013-02-07 NOTE — ED Provider Notes (Signed)
CSN: [id number]     Arrival date & time 02/07/13  1132 History     First MD Initiated Contact with Patient 02/07/13 1150     Chief Complaint  Patient presents with  . Puncture Wound      HPI Comments: Patient reports that about one hour ago, while stepping onto the ground from the last rung of a ladder, he tripped and fell.   He punctured his left palm and left forearm on a nail protruding from a board on the ground.  His last Tdap was about 6 years ago.  The nail penetrated his forearm about one inch, and left a shallow laceration on his palm.  Patient is a 67 y.o. male presenting with skin laceration. The history is provided by the patient.  Laceration Location:  Hand (forearm) Hand laceration location:  L palm Length (cm):  1cm Depth:  Through dermis Quality: straight   Bleeding: controlled   Time since incident:  1 hour Injury mechanism: sharp nail. Foreign body present:  No foreign bodies Relieved by:  Nothing Tetanus status:  Up to date   Past Medical History  Diagnosis Date  . Hyperlipidemia   . Hiatal hernia   . Esophageal stricture    Past Surgical History  Procedure Laterality Date  . Appendectomy  1960  . Arterial catheter  1997   Family History  Problem Relation Age of Onset  . Skin cancer Father   . Colon polyps Mother     benign  . Dementia Mother   . Colon cancer Neg Hx   . Stomach cancer Neg Hx   . Esophageal cancer Neg Hx    History  Substance Use Topics  . Smoking status: Never Smoker   . Smokeless tobacco: Never Used  . Alcohol Use: No     Comment: quit 20 years ago    Review of Systems  All other systems reviewed and are negative.    Allergies  Review of patient's allergies indicates no known allergies.  Home Medications   Current Outpatient Rx  Name  Route  Sig  Dispense  Refill  . cephALEXin (KEFLEX) 500 MG capsule   Oral   Take 1 capsule (500 mg total) by mouth 2 (two) times daily.   20 capsule   0   . magnesium oxide  (MAG-OX) 400 MG tablet   Oral   Take 800 mg by mouth daily.         Marland Kitchen omeprazole (PRILOSEC) 20 MG capsule   Oral   Take 1 capsule (20 mg total) by mouth daily.   30 capsule   11   . simvastatin (ZOCOR) 20 MG tablet   Oral   Take 1 tablet (20 mg total) by mouth every evening.   90 tablet   3    BP 175/87  Pulse 69  Temp(Src) 98 F (36.7 C) (Oral)  Resp 16  Ht 5\' 10"  (1.778 m)  Wt 176 lb 12.8 oz (80.196 kg)  BMI 25.37 kg/m2  SpO2 96% Physical Exam  Nursing note and vitals reviewed. Constitutional: He is oriented to person, place, and time. He appears well-developed and well-nourished. No distress.  HENT:  Head: Atraumatic.  Eyes: Conjunctivae are normal. Pupils are equal, round, and reactive to light.  Musculoskeletal: Normal range of motion.       Left forearm: He exhibits tenderness, swelling and laceration. He exhibits no bony tenderness, no edema and no deformity.       Arms:  Left hand: He exhibits laceration. He exhibits normal range of motion, no tenderness, no bony tenderness, normal two-point discrimination, normal capillary refill and no swelling.       Hands: Over the thenar eminence of the left hand palm is a 1cm simple linear superficial laceration through the dermis.  On the left forearm is a puncture wound with a 5mm opening.  Mild surrounding swelling.  Minimal tenderness.  No foreign body observed.  Distal neurovascular function is intact.  Left wrist and fingers have full range of motion.  Neurological: He is alert and oriented to person, place, and time.  Skin: Skin is warm and dry.    ED Course   Procedures  Laceration Repair Discussed benefits and risks of procedure and verbal consent obtained. Using sterile technique and local 1% lidocaine without epinephrine, cleansed wounds with Betadine followed by copious lavage with normal saline.  Wounds carefully inspected for debris and foreign bodies; none found.  Wound on left hand palm closed with  #3, 4-0 interrupted nylon sutures.  Puncture wound on left forearm closed loosely with #1, 4-0 nylon suture.  Bacitracin and non-stick sterile dressings applied.  Wound precautions explained to patient.  Return for suture removal in 10 days.     1. Laceration of left hand, initial encounter   2. Puncture wound of left forearm, initial encounter     MDM  Tdap administered Because of puncture wound left forearm, will prophylactically begin Keflex. Change dressing daily and apply Bacitracin ointment to wound.  Keep wound clean and dry.  Return for any signs of infection (or follow-up with family doctor):  Increasing redness, swelling, pain, heat, drainage, etc. Return in 10 days for suture removal.  Lattie Haw, MD 02/07/13 1242

## 2013-05-09 ENCOUNTER — Other Ambulatory Visit: Payer: Self-pay

## 2013-07-09 DIAGNOSIS — J309 Allergic rhinitis, unspecified: Secondary | ICD-10-CM | POA: Diagnosis not present

## 2013-07-09 DIAGNOSIS — J069 Acute upper respiratory infection, unspecified: Secondary | ICD-10-CM | POA: Diagnosis not present

## 2013-07-22 ENCOUNTER — Other Ambulatory Visit: Payer: Self-pay | Admitting: Family Medicine

## 2013-07-24 ENCOUNTER — Ambulatory Visit (INDEPENDENT_AMBULATORY_CARE_PROVIDER_SITE_OTHER): Payer: Medicare Other | Admitting: Family Medicine

## 2013-07-24 ENCOUNTER — Encounter: Payer: Self-pay | Admitting: Family Medicine

## 2013-07-24 VITALS — BP 171/91 | HR 69 | Ht 68.0 in | Wt 180.0 lb

## 2013-07-24 DIAGNOSIS — Z5181 Encounter for therapeutic drug level monitoring: Secondary | ICD-10-CM

## 2013-07-24 DIAGNOSIS — E785 Hyperlipidemia, unspecified: Secondary | ICD-10-CM

## 2013-07-24 DIAGNOSIS — Z0000 Encounter for general adult medical examination without abnormal findings: Secondary | ICD-10-CM

## 2013-07-24 DIAGNOSIS — Z125 Encounter for screening for malignant neoplasm of prostate: Secondary | ICD-10-CM

## 2013-07-24 DIAGNOSIS — Z79899 Other long term (current) drug therapy: Secondary | ICD-10-CM | POA: Diagnosis not present

## 2013-07-24 DIAGNOSIS — I10 Essential (primary) hypertension: Secondary | ICD-10-CM | POA: Diagnosis not present

## 2013-07-24 DIAGNOSIS — R259 Unspecified abnormal involuntary movements: Secondary | ICD-10-CM | POA: Diagnosis not present

## 2013-07-24 DIAGNOSIS — Z136 Encounter for screening for cardiovascular disorders: Secondary | ICD-10-CM

## 2013-07-24 DIAGNOSIS — Z131 Encounter for screening for diabetes mellitus: Secondary | ICD-10-CM | POA: Diagnosis not present

## 2013-07-24 DIAGNOSIS — R251 Tremor, unspecified: Secondary | ICD-10-CM

## 2013-07-24 NOTE — Progress Notes (Signed)
Subjective:    Nicholas Murillo is a 68 y.o. male who presents for Medicare Annual/Subsequent preventive examination.   Preventive Screening-Counseling & Management  Tobacco History  Smoking status  . Never Smoker   Smokeless tobacco  . Never Used    Colonoscopy: He declines colonoscopy again this year, he is interested in stool cards but will not commit to this testing as of yet Prostate: Discussed screening risks/beneifts with patient on 07/24/2013.  No current indication  Influenza Vaccine: He declines today Pneumovax: PP-23 1/14, no desire for Pcv 13 Td/Tdap: 02/2013 UTD Zoster: 09/03/12    Problems Prior to Visit 1. HLD, HTN  Current Problems (verified) Patient Active Problem List   Diagnosis Date Noted  . Hyperlipidemia LDL goal < 130 10/23/2012  . Essential hypertension, benign 10/17/2012  . Esophageal stricture 07/10/2012  . Hiatal hernia 07/10/2012  . Headache 06/18/2012  . Spasm 06/18/2012    Medications Prior to Visit Current Outpatient Prescriptions on File Prior to Visit  Medication Sig Dispense Refill  . magnesium oxide (MAG-OX) 400 MG tablet Take 800 mg by mouth daily.      Marland Kitchen omeprazole (PRILOSEC) 20 MG capsule Take 1 capsule (20 mg total) by mouth daily.  30 capsule  11  . simvastatin (ZOCOR) 20 MG tablet TAKE ONE TABLET BY MOUTH EVERY DAY IN THE EVENING  90 tablet  0   No current facility-administered medications on file prior to visit.    Current Medications (verified) Current Outpatient Prescriptions  Medication Sig Dispense Refill  . magnesium oxide (MAG-OX) 400 MG tablet Take 800 mg by mouth daily.      Marland Kitchen omeprazole (PRILOSEC) 20 MG capsule Take 1 capsule (20 mg total) by mouth daily.  30 capsule  11  . simvastatin (ZOCOR) 20 MG tablet TAKE ONE TABLET BY MOUTH EVERY DAY IN THE EVENING  90 tablet  0   No current facility-administered medications for this visit.     Allergies (verified) Review of patient's allergies indicates no known  allergies.   PAST HISTORY  Family History Family History  Problem Relation Age of Onset  . Skin cancer Father   . Colon polyps Mother     benign  . Dementia Mother   . Colon cancer Neg Hx   . Stomach cancer Neg Hx   . Esophageal cancer Neg Hx     Social History History  Substance Use Topics  . Smoking status: Never Smoker   . Smokeless tobacco: Never Used  . Alcohol Use: No     Comment: quit 20 years ago    Are there smokers in your home (other than you)?  No  Risk Factors Current exercise habits: Home exercise routine includes walking 1 hrs per day.  Dietary issues discussed: Lean meats, veggies, fruits, whole grains   Cardiac risk factors: advanced age (older than 29 for men, 93 for women) and hypertension.  Depression Screen (Note: if answer to either of the following is "Yes", a more complete depression screening is indicated)   Q1: Over the past two weeks, have you felt down, depressed or hopeless? No  Q2: Over the past two weeks, have you felt little interest or pleasure in doing things? No  Have you lost interest or pleasure in daily life? No  Do you often feel hopeless? No  Do you cry easily over simple problems? No  Activities of Daily Living In your present state of health, do you have any difficulty performing the following activities?:  Driving? No Managing  money?  No Feeding yourself? No Getting from bed to chair? No Climbing a flight of stairs? No Preparing food and eating?: No Bathing or showering? No Getting dressed: No Getting to the toilet? No Using the toilet:No Moving around from place to place: No In the past year have you fallen or had a near fall?:No   Are you sexually active?  Yes  Do you have more than one partner?  No  Hearing Difficulties: Yes Do you often ask people to speak up or repeat themselves? Yes Do you experience ringing or noises in your ears? Yes Do you have difficulty understanding soft or whispered voices?  Yes   Do you feel that you have a problem with memory? No  Do you often misplace items? No  Do you feel safe at home?  No  Cognitive Testing  Alert? Yes  Normal Appearance?Yes  Oriented to person? Yes  Place? Yes   Time? Yes  Recall of three objects?  Yes  Can perform simple calculations? Yes  Displays appropriate judgment?Yes  Can read the correct time from a watch face?Yes   Advanced Directives have been discussed with the patient? Yes   List the Names of Other Physician/Practitioners you currently use: 1.  None  Indicate any recent Medical Services you may have received from other than [facility] providers in the past year (date may be approximate).  Immunization History  Administered Date(s) Administered  . Pneumococcal Polysaccharide-23 07/18/2012  . Tdap 02/07/2013  . Zoster 09/03/2012    Screening Tests Health Maintenance  Topic Date Due  . Zostavax  11/13/2005  . Influenza Vaccine  07/24/2014  . Colonoscopy  03/04/2021  . Tetanus/tdap  02/08/2023  . Pneumococcal Polysaccharide Vaccine Age 49 And Over  Completed    All answers were reviewed with the patient and necessary referrals were made:  Marcial Pacas, DO   07/24/2013   History reviewed: allergies, current medications, past family history, past medical history, past social history, past surgical history and problem list  Review of Systems Review of Systems - General ROS: negative for - chills, fever, night sweats, weight gain or weight loss Ophthalmic ROS: negative for - decreased vision Psychological ROS: negative for - anxiety or depression ENT ROS: negative for -  nasal congestion, tinnitus or allergies Hematological and Lymphatic ROS: negative for - bleeding problems, bruising or swollen lymph nodes Breast ROS: negative Respiratory ROS: no cough, shortness of breath, or wheezing Cardiovascular ROS: no chest pain or dyspnea on exertion Gastrointestinal ROS: no abdominal pain, change in bowel habits, or  black or bloody stools Genito-Urinary ROS: negative for - genital discharge, genital ulcers, incontinence or abnormal bleeding from genitals Musculoskeletal ROS: negative for - joint pain or muscle pain Neurological ROS: negative for - headaches or memory loss Dermatological ROS: negative for lumps, mole changes, rash and skin lesion changes   Objective:     Vision by Snellen chart: right eye:20/50, left eye:20/40 Blood pressure 171/91, pulse 69, height 5\' 8"  (1.727 m), weight 180 lb (81.647 kg). Body mass index is 27.38 kg/(m^2).  General: No Acute Distress HEENT: Atraumatic, normocephalic, conjunctivae normal without scleral icterus.  No nasal discharge, hearing grossly intact, TMs with good landmarks bilaterally with no middle ear abnormalities, posterior pharynx clear without oral lesions. Neck: Supple, trachea midline, no cervical nor supraclavicular adenopathy. Pulmonary: Clear to auscultation bilaterally without wheezing, rhonchi, nor rales. Cardiac: Regular rate and rhythm.  No murmurs, rubs, nor gallops. No peripheral edema.  2+ peripheral pulses bilaterally. Abdomen: Bowel  sounds normal.  No masses.  Non-tender without rebound.  Negative Murphy's sign. GU: No inguinal hernia MSK: Grossly intact, no signs of weakness.  Full strength throughout upper and lower extremities.  Full ROM in upper and lower extremities.  No midline spinal tenderness. Neuro: Gait unremarkable, CN II-XII grossly intact.  C5-C6 Reflex 2/4 Bilaterally, L4 Reflex 2/4 Bilaterally.  Cerebellar function intact. Skin: No rashes. Psych: Alert and oriented to person/place/time.  Thought process normal. No anxiety/depression.     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 Not indicated  Patient Instructions (the written plan) was given to the patient.  Medicare Attestation I have personally reviewed: The patient's medical and social history Their use of alcohol, tobacco or illicit drugs Their current medications and supplements The patient's functional ability including ADLs,fall risks, home safety risks, cognitive, and hearing and visual impairment Diet and physical activities Evidence for depression or mood disorders  The patient's weight, height, BMI, and visual acuity have been recorded in the chart.  I have made referrals, counseling, and provided education to the patient based on review of the above and I have provided the patient with a written personalized care plan for preventive services.    Encouraged him to strongly consider stool cards in 2 establish care with a local ophthalmologist. Offered audiology however he kindly declines.  Marcial Pacas, DO   07/24/2013

## 2013-07-24 NOTE — Patient Instructions (Signed)
Dr. Jyron Turman's General Advice Following Your Complete Physical Exam  The Benefits of Regular Exercise: Unless you suffer from an uncontrolled cardiovascular condition, studies strongly suggest that regular exercise and physical activity will add to both the quality and length of your life.  The World Health Organization recommends 150 minutes of moderate intensity aerobic activity every week.  This is best split over 3-4 days a week, and can be as simple as a brisk walk for just over 35 minutes "most days of the week".  This type of exercise has been shown to lower LDL-Cholesterol, lower average blood sugars, lower blood pressure, lower cardiovascular disease risk, improve memory, and increase one's overall sense of wellbeing.  The addition of anaerobic (or "strength training") exercises offers additional benefits including but not limited to increased metabolism, prevention of osteoporosis, and improved overall cholesterol levels.  How Can I Strive For A Low-Fat Diet?: Current guidelines recommend that 25-35 percent of your daily energy (food) intake should come from fats.  One might ask how can this be achieved without having to dissect each meal on a daily basis?  Switch to skim or 1% milk instead of whole milk.  Focus on lean meats such as ground turkey, fresh fish, baked chicken, and lean cuts of beef as your source of dietary protein.  Consume less than 300mg/day of dietary cholesterol.  Limit trans fatty acid consumption primarily by limiting synthetic trans fats such as partially hydrogenated oils (Ex: fried fast foods).  Focus efforts on reducing your intake of "solid" fats (Ex: Butter).  Substitute olive or vegetable oil for solid fats where possible.  Moderation of Salt Intake: Provided you don't carry a diagnosis of congestive heart failure nor renal failure, I recommend a daily allowance of no more than 2300 mg of salt (sodium).  Keeping under this daily goal is associated with a  decreased risk of cardiovascular events, creeping above it can lead to elevated blood pressures and increases your risk of cardiovascular events.  Milligrams (mg) of salt is listed on all nutrition labels, and your daily intake can add up faster than you think.  Most canned and frozen dinners can pack in over half your daily salt allowance in one meal.    Lifestyle Health Risks: Certain lifestyle choices carry specific health risks.  As you may already know, tobacco use has been associated with increasing one's risk of cardiovascular disease, pulmonary disease, numerous cancers, among many other issues.  What you may not know is that there are medications and nicotine replacement strategies that can more than double your chances of successfully quitting.  I would be thrilled to help manage your quitting strategy if you currently use tobacco products.  When it comes to alcohol use, I've yet to find an "ideal" daily allowance.  Provided an individual does not have a medical condition that is exacerbated by alcohol consumption, general guidelines determine "safe drinking" as no more than two standard drinks for a man or no more than one standard drink for a male per day.  However, much debate still exists on whether any amount of alcohol consumption is technically "safe".  My general advice, keep alcohol consumption to a minimum for general health promotion.  If you or others believe that alcohol, tobacco, or recreational drug use is interfering with your life, I would be happy to provide confidential counseling regarding treatment options.  General "Over The Counter" Nutrition Advice: Postmenopausal women should aim for a daily calcium intake of 1200 mg, however a significant   portion of this might already be provided by diets including milk, yogurt, cheese, and other dairy products.  Vitamin D has been shown to help preserve bone density, prevent fatigue, and has even been shown to help reduce falls in the  elderly.  Ensuring a daily intake of 800 Units of Vitamin D is a good place to start to enjoy the above benefits, we can easily check your Vitamin D level to see if you'd potentially benefit from supplementation beyond 800 Units a day.  Folic Acid intake should be of particular concern to women of childbearing age.  Daily consumption of 400-800 mcg of Folic Acid is recommended to minimize the chance of spinal cord defects in a fetus should pregnancy occur.    For many adults, accidents still remain one of the most common culprits when it comes to cause of death.  Some of the simplest but most effective preventitive habits you can adopt include regular seatbelt use, proper helmet use, securing firearms, and regularly testing your smoke and carbon monoxide detectors.  Ahnika Hannibal B. Willys Salvino DO [facility] [address], [address] [place], [place] [address] Phone: [phone]   Testicular Self-Exam A self-examination of your testicles involves looking at and feeling your testicles for abnormal lumps or swelling. Several things can cause swelling, lumps, or pain in your testicles. Some of these causes are:  Injuries.  Inflammation.  Infection.  Accumulation of fluids around your testicle (hydrocele).  Twisted testicles (testicular torsion).  Testicular cancer. Self-examination of the testicles and groin areas may be advised if you are at risk for testicular cancer. Risks for testicular cancer include:  An undescended testicle (cryptorchidism).  A history of previous testicular cancer.  A family history of testicular cancer. The testicles are easiest to examine after warm baths or showers and are more difficult to examine when you are cold. This is because the muscles attached to the testicles retract and pull them up higher or into the abdomen. Follow these steps while you are standing:  Hold your penis away from your body.  Roll one testicle between your thumb and forefinger,  feeling the entire testicle.  Roll the other testicle between your thumb and forefinger, feeling the entire testicle. Feel for lumps, swelling, or discomfort. A normal testicle is egg shaped and feels firm. It is smooth and not tender. The spermatic cord can be felt as a firm spaghetti-like cord at the back of your testicle. It is also important to examine the crease between the front of your leg and your abdomen. Feel for any bumps that are tender. These could be enlarged lymph nodes.  Document Released: 09/26/2000 Document Revised: 02/20/2013 Document Reviewed: 12/10/2012 [employer] Patient Information 2014 [employer].  

## 2013-07-26 LAB — COMPLETE METABOLIC PANEL WITH GFR
ALT: 21 U/L (ref 0–53)
AST: 21 U/L (ref 0–37)
Albumin: 4.2 g/dL (ref 3.5–5.2)
Alkaline Phosphatase: 67 U/L (ref 39–117)
BILIRUBIN TOTAL: 0.6 mg/dL (ref 0.3–1.2)
BUN: 23 mg/dL (ref 6–23)
CHLORIDE: 108 meq/L (ref 96–112)
CO2: 27 meq/L (ref 19–32)
Calcium: 9 mg/dL (ref 8.4–10.5)
Creat: 0.91 mg/dL (ref 0.50–1.35)
GFR, Est Non African American: 87 mL/min
Glucose, Bld: 112 mg/dL — ABNORMAL HIGH (ref 70–99)
Potassium: 4.4 mEq/L (ref 3.5–5.3)
SODIUM: 142 meq/L (ref 135–145)
TOTAL PROTEIN: 6.9 g/dL (ref 6.0–8.3)

## 2013-07-26 LAB — LIPID PANEL
Cholesterol: 162 mg/dL (ref 0–200)
HDL: 44 mg/dL (ref >39)
LDL CALC: 98 mg/dL (ref 0–99)
Total CHOL/HDL Ratio: 3.7 Ratio
Triglycerides: 98 mg/dL (ref <150)
VLDL: 20 mg/dL (ref 0–40)

## 2013-07-27 LAB — PSA, MEDICARE: PSA: 3.64 ng/mL (ref <=4.00)

## 2013-07-29 ENCOUNTER — Encounter: Payer: Self-pay | Admitting: Family Medicine

## 2013-07-29 DIAGNOSIS — R7303 Prediabetes: Secondary | ICD-10-CM | POA: Insufficient documentation

## 2013-10-01 ENCOUNTER — Ambulatory Visit (INDEPENDENT_AMBULATORY_CARE_PROVIDER_SITE_OTHER): Payer: Medicare Other | Admitting: Family Medicine

## 2013-10-01 ENCOUNTER — Encounter: Payer: Self-pay | Admitting: Family Medicine

## 2013-10-01 VITALS — BP 151/90 | HR 61 | Wt 182.0 lb

## 2013-10-01 DIAGNOSIS — R259 Unspecified abnormal involuntary movements: Secondary | ICD-10-CM | POA: Diagnosis not present

## 2013-10-01 DIAGNOSIS — R252 Cramp and spasm: Secondary | ICD-10-CM

## 2013-10-01 DIAGNOSIS — K222 Esophageal obstruction: Secondary | ICD-10-CM

## 2013-10-01 DIAGNOSIS — R739 Hyperglycemia, unspecified: Secondary | ICD-10-CM

## 2013-10-01 DIAGNOSIS — R7309 Other abnormal glucose: Secondary | ICD-10-CM

## 2013-10-01 LAB — POCT GLYCOSYLATED HEMOGLOBIN (HGB A1C): HEMOGLOBIN A1C: 5.7

## 2013-10-01 NOTE — Progress Notes (Signed)
CC: Nicholas Murillo is a 68 y.o. male is here for f/u labs   Subjective: HPI:  Followup hyperglycemia: Fasting metabolic panel earlier this year showed mild hyperglycemia. She has never been told he has high blood sugar in the past. He denies any family history of type I or 2 diabetes. Denies polyuria polyphasia or polydipsia. He stays active with hobbies and his job on a daily basis, he's a very picky eater focusing on low glycemic index foods.  Denies poorly healing wounds but he does have some vision loss that was noticed at last visit.  States that his cramps are improving about 50% he still gets cramping in the hands and in inner thighs a few days a week. He's drinking tonic water daily and takes magnesium pretty much everyday.  Denies any other motor or sensory disturbances   He states that he stopped taking Prilosec as of 2015. If he eats dinner within 3 hours after lying down he gets abdominal discomfort described as indigestion with acid sensation of the back of the sternum. Symptoms are absent he eats dinner 3 hours prior to lying down. He denies any trouble swallowing or any regurgitation. Currently not taking any acid medication  Review Of Systems Outlined In HPI  Past Medical History  Diagnosis Date  . Hyperlipidemia   . Hiatal hernia   . Esophageal stricture     Past Surgical History  Procedure Laterality Date  . Appendectomy  1960  . Arterial catheter  1997   Family History  Problem Relation Age of Onset  . Skin cancer Father   . Colon polyps Mother     benign  . Dementia Mother   . Colon cancer Neg Hx   . Stomach cancer Neg Hx   . Esophageal cancer Neg Hx     History   Social History  . Marital Status: Significant Other    Spouse Name: N/A    Number of Children: 3  . Years of Education: N/A   Occupational History  . semi-retired    . [place] History Main Topics  . Smoking status: Never Smoker   . Smokeless tobacco: Never Used  .  Alcohol Use: No     Comment: quit 20 years ago  . Drug Use: No  . Sexual Activity: Yes   Other Topics Concern  . Not on file   Social History Narrative  . No narrative on file     Objective: BP 151/90  Pulse 61  Wt 182 lb (82.555 kg)  General: Alert and Oriented, No Acute Distress HEENT: Pupils equal, round, reactive to light. Conjunctivae clear.   moist December and pharynx unremarkable  Lungs: Clear to auscultation bilaterally, no wheezing/ronchi/rales.  Comfortable work of breathing. Good air movement. Cardiac: Regular rate and rhythm. Normal S1/S2.  No murmurs, rubs, nor gallops.   Extremities: No peripheral edema.  Strong peripheral pulses.  Mental Status: No depression, anxiety, nor agitation. Skin: Warm and dry.  Assessment & Plan: Chipper was seen today for f/u labs.  Diagnoses and associated orders for this visit:  Hyperglycemia - POCT HgB A1C  Esophageal stricture  Spasm    Hyperglycemia: Control with A1c of 5.7, we discussed physical activity and dietary interventions to reduce his risk of developing type 2 diabetes. Esophageal stricture: Controlled this time however asked him to start back on Prilosec if he finds that he is not able to eat dinner 3 hours prior to lying down as reflux can contribute  to return to stricture Spasm: Discussed stretching exercises on a daily basis  Return in about 3 months (around 12/31/2013).

## 2013-10-22 ENCOUNTER — Other Ambulatory Visit: Payer: Self-pay | Admitting: Family Medicine

## 2014-01-07 DIAGNOSIS — H2510 Age-related nuclear cataract, unspecified eye: Secondary | ICD-10-CM | POA: Diagnosis not present

## 2014-01-20 ENCOUNTER — Other Ambulatory Visit: Payer: Self-pay | Admitting: Family Medicine

## 2014-01-20 ENCOUNTER — Telehealth: Payer: Self-pay | Admitting: *Deleted

## 2014-01-20 NOTE — Telephone Encounter (Signed)
Patient called and lmom that he had questions regarding his simvastatin refill. Waiting on return call. Margette Fast, CMA

## 2014-02-24 ENCOUNTER — Emergency Department (INDEPENDENT_AMBULATORY_CARE_PROVIDER_SITE_OTHER)
Admission: EM | Admit: 2014-02-24 | Discharge: 2014-02-24 | Disposition: A | Discharge status: Home / Self Care | Payer: Medicare Other | Source: Home / Self Care | Attending: Family Medicine | Admitting: Family Medicine

## 2014-02-24 ENCOUNTER — Encounter: Payer: Self-pay | Admitting: Emergency Medicine

## 2014-02-24 DIAGNOSIS — R079 Chest pain, unspecified: Secondary | ICD-10-CM | POA: Diagnosis not present

## 2014-02-24 DIAGNOSIS — K2100 Gastro-esophageal reflux disease with esophagitis, without bleeding: Secondary | ICD-10-CM | POA: Diagnosis not present

## 2014-02-24 DIAGNOSIS — K219 Gastro-esophageal reflux disease without esophagitis: Secondary | ICD-10-CM | POA: Diagnosis not present

## 2014-02-24 MED ORDER — OMEPRAZOLE 20 MG PO CPDR: 20.0000 mg | DELAYED_RELEASE_CAPSULE | Freq: Every day | ORAL | Status: DC

## 2014-02-24 NOTE — ED Provider Notes (Signed)
CSN: [id number]     Arrival date & time 02/24/14  1214 History   First MD Initiated Contact with Patient 02/24/14 1219     Chief Complaint  Patient presents with  . Chest Pain      HPI Comments: About 15 minutes prior to visit, patient developed a vague non-radiating "heaviness" in his anterior chest, associated with a sensation of "stiffness" in his anterior neck.  No shortness of breath.  No nausea/vomiting.  The pain has gradually decreased and is now resolved.  No cough.  He states that his pain does not feel like heartburn.  He notes that he has been under increase stress over the past several days.  He also notes that he had been lifting heavy objects earlier in the week.                                He states that he had a similar but more severe episode, radiating to the back, about 18 years ago.  He had been under increased stress, and a cardiac cath at that time was negative. He has a history of dysphagia and distal esophageal stricture, and had been taking Prilosec (but not recently).  He notes that a grandmother had CHF, but no other history of CV disease.  Patient is a 68 y.o. male presenting with chest pain. The history is provided by the patient.  Chest Pain Pain location:  Substernal area Pain quality: pressure   Pain radiates to:  Does not radiate Pain radiates to the back: no   Pain severity:  Moderate Onset quality:  Sudden Duration:  30 minutes Timing:  Constant Progression:  Resolved Chronicity:  New Context: at rest   Relieved by:  None tried Worsened by:  Nothing tried Ineffective treatments:  None tried Associated symptoms: AICD problem   Associated symptoms: no abdominal pain, no anorexia, no anxiety, no back pain, no cough, no diaphoresis, no dizziness, no dysphagia, no fatigue, no fever, no headache, no heartburn, no lower extremity edema, no nausea, no near-syncope, no palpitations, no shortness of breath, no syncope and not vomiting   Risk factors: high  cholesterol     Past Medical History  Diagnosis Date  . Hyperlipidemia   . Hiatal hernia   . Esophageal stricture    Past Surgical History  Procedure Laterality Date  . Appendectomy  1960  . Arterial catheter  1997   Family History  Problem Relation Age of Onset  . Skin cancer Father   . Colon polyps Mother     benign  . Dementia Mother   . Colon cancer Neg Hx   . Stomach cancer Neg Hx   . Esophageal cancer Neg Hx    History  Substance Use Topics  . Smoking status: Never Smoker   . Smokeless tobacco: Never Used  . Alcohol Use: No     Comment: quit 20 years ago    Review of Systems  Constitutional: Negative for fever, diaphoresis and fatigue.  HENT: Negative for trouble swallowing.   Respiratory: Negative for cough and shortness of breath.   Cardiovascular: Positive for chest pain. Negative for palpitations, syncope and near-syncope.  Gastrointestinal: Negative for heartburn, nausea, vomiting, abdominal pain and anorexia.  Musculoskeletal: Negative for back pain.  Neurological: Negative for dizziness and headaches.  All other systems reviewed and are negative.   Allergies  Review of patient's allergies indicates no known allergies.  Home Medications  Prior to Admission medications   Medication Sig Start Date End Date Taking? Authorizing Provider  magnesium oxide (MAG-OX) 400 MG tablet Take 800 mg by mouth daily.    Historical Provider, MD  omeprazole (PRILOSEC) 20 MG capsule Take 1 capsule (20 mg total) by mouth daily. Take 30 to 60 minutes before a meal 02/24/14   Kandra Nicolas, MD  simvastatin (ZOCOR) 20 MG tablet TAKE ONE TABLET BY MOUTH IN THE EVENING 01/20/14   Sean Hommel, DO   BP 157/69  Pulse 60  Temp(Src) 98.5 F (36.9 C) (Oral)  Resp 18  SpO2 99% Physical Exam Nursing notes and Vital Signs reviewed. Appearance:  Patient appears healthy, stated age, and in no acute distress Eyes:  Pupils are equal, round, and reactive to light and accomodation.   Extraocular movement is intact.  Conjunctivae are not inflamed  Pharynx:  Normal Neck:  Supple.  No adenopathy  Lungs:  Clear to auscultation.  Breath sounds are equal.  Heart:  Regular rate and rhythm without murmurs, rubs, or gallops.  Abdomen:  Nontender without masses or hepatosplenomegaly.  Bowel sounds are present.  No CVA or flank tenderness.  Extremities:  No edema.  No calf tenderness Skin:  No rash present.   ED Course  Procedures none   Labs Reviewed -   EKG: Rate:  63 BPM PR:  158 msec QT:  396 msec QTcH:  401 msec QRSD:  94 msec QRS axis:  11 degrees Interpretation:  within normal limits; normal sinus rhythm        MDM   1. Chest pain, unspecified chest pain type   2. Gastroesophageal reflux disease, esophagitis presence not specified   3. Reflux esophagitis    Resume Prilosec 20mg , one daily 30 to 60 minutes before a meal. Followup with Family Doctor in one week.     Kandra Nicolas, MD 02/24/14 [phone]

## 2014-02-24 NOTE — ED Notes (Signed)
Reports onset of mid sternum pain with jaw muscles feeling fatigued starting about 45 minutes ago; no hx of heart disease. Looks somewhat pale and is perspiring. Dr.Beese notified and EKG in process within 5 minutes of admission.

## 2014-02-24 NOTE — Discharge Instructions (Signed)

## 2014-02-25 ENCOUNTER — Ambulatory Visit (INDEPENDENT_AMBULATORY_CARE_PROVIDER_SITE_OTHER): Payer: Medicare Other | Admitting: Family Medicine

## 2014-02-25 ENCOUNTER — Encounter: Payer: Self-pay | Admitting: Family Medicine

## 2014-02-25 VITALS — BP 144/67 | HR 61 | Wt 174.0 lb

## 2014-02-25 DIAGNOSIS — K2100 Gastro-esophageal reflux disease with esophagitis, without bleeding: Secondary | ICD-10-CM

## 2014-02-25 MED ORDER — OMEPRAZOLE 20 MG PO CPDR: 20.0000 mg | DELAYED_RELEASE_CAPSULE | Freq: Every day | ORAL | Status: DC

## 2014-02-25 NOTE — Progress Notes (Signed)
CC: Nicholas Murillo is a 68 y.o. male is here for f/u UC   Subjective: HPI:  Complains of chest pain that occurred yesterday while he was at rest. He was described as a stabbing sensation and squeezing sensation behind the sternum that was nonradiating. It lasted about an hour and he went to urgent care next door and while the pain was present he had an EKG which was normal. Pain subsided within a few minutes after presenting to urgent care. He's been pain free ever since he left yesterday. Interventions include starting omeprazole 20 mg as of yesterday.  Looking back he tells me that for the past 2 weeks she's had an acid sensation in the back of his throat and behind the sternum is present when he lies down at night. He's been extremely active lately building a bridge and carrying concrete blocks and has not had any chest pain or motor or sensory disturbances. Denies dysphagia, regurgitation, difficulty swallowing.   Review Of Systems Outlined In HPI  Past Medical History  Diagnosis Date  . Hyperlipidemia   . Hiatal hernia   . Esophageal stricture     Past Surgical History  Procedure Laterality Date  . Appendectomy  1960  . Arterial catheter  1997   Family History  Problem Relation Age of Onset  . Skin cancer Father   . Colon polyps Mother     benign  . Dementia Mother   . Colon cancer Neg Hx   . Stomach cancer Neg Hx   . Esophageal cancer Neg Hx     History   Social History  . Marital Status: Significant Other    Spouse Name: N/A    Number of Children: 3  . Years of Education: N/A   Occupational History  . semi-retired    . [place] History Main Topics  . Smoking status: Never Smoker   . Smokeless tobacco: Never Used  . Alcohol Use: No     Comment: quit 20 years ago  . Drug Use: No  . Sexual Activity: Yes   Other Topics Concern  . Not on file   Social History Narrative  . No narrative on file     Objective: BP 144/67  Pulse 61  Wt 174  lb (78.926 kg)  General: Alert and Oriented, No Acute Distress HEENT: Pupils equal, round, reactive to light. Conjunctivae clear.  Moist membranes pharynx unremarkable Lungs: Clear to auscultation bilaterally, no wheezing/ronchi/rales.  Comfortable work of breathing. Good air movement. Cardiac: Regular rate and rhythm. Normal S1/S2.  No murmurs, rubs, nor gallops.   Abdomen: Soft nontender Extremities: No peripheral edema.  Strong peripheral pulses.  Mental Status: No depression, anxiety, nor agitation. Skin: Warm and dry.  Assessment & Plan: Nicholas Murillo was seen today for f/u uc.  Diagnoses and associated orders for this visit:  Reflux esophagitis - omeprazole (PRILOSEC) 20 MG capsule; Take 1 capsule (20 mg total) by mouth daily. Take 30 to 60 minutes before a meal    Reflux esophagitis: Restart omeprazole, if no benefit after one week will increase to Protonix 40 mg daily. I encouraged him to continue on PPI for the next 3 months and to consider taking 1 indefinitely given his history of esophageal stricture and return of his reflux symptoms off of PPI   Return in about 3 months (around 05/28/2014) for GERD follow up.Marland Kitchen

## 2014-07-22 ENCOUNTER — Other Ambulatory Visit: Payer: Self-pay | Admitting: Family Medicine

## 2014-08-26 ENCOUNTER — Other Ambulatory Visit: Payer: Self-pay | Admitting: Family Medicine

## 2014-09-05 ENCOUNTER — Encounter: Payer: Self-pay | Admitting: Family Medicine

## 2014-09-05 ENCOUNTER — Ambulatory Visit (INDEPENDENT_AMBULATORY_CARE_PROVIDER_SITE_OTHER): Payer: Medicare Other | Admitting: Family Medicine

## 2014-09-05 VITALS — BP 126/70 | HR 62 | Wt 178.0 lb

## 2014-09-05 DIAGNOSIS — E785 Hyperlipidemia, unspecified: Secondary | ICD-10-CM

## 2014-09-05 DIAGNOSIS — I10 Essential (primary) hypertension: Secondary | ICD-10-CM | POA: Diagnosis not present

## 2014-09-05 DIAGNOSIS — L570 Actinic keratosis: Secondary | ICD-10-CM | POA: Diagnosis not present

## 2014-09-05 DIAGNOSIS — K222 Esophageal obstruction: Secondary | ICD-10-CM

## 2014-09-05 NOTE — Progress Notes (Signed)
CC: Nicholas Murillo is a 69 y.o. male is here for f/u cholesterol   Subjective: HPI:  Follow-up esophageal stricture: Since I saw him last he's been taking Prilosec on a daily basis. He's had only one episode of burning in the back of his throat but no choking or regurgitation. Symptoms that were present around the time I saw him last and now overall completely gone. Denies any known intolerance or side effects. No abdominal pain or unintentional weight loss. No difficulty swallowing  Follow-up essential hypertension: He continues to keep sodium at a minimum in his diet and he stays active. He's been building a bridge all by himself lately which has been keeping him busy from physical activity standpoint. No chest pain shortness of breath orthopnea nor peripheral edema  Follow-up hyperlipidemia: Continues to take simvastatin 20 mg on a daily basis without right upper quadrant pain or myalgias. Denies any side effects  He has some flaky growths on his forehead and left forearm. They've been there for almost a year now if not longer. He tells me he'll scratch them off only to grow back in a few weeks. They're painless. 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   Review Of Systems Outlined In HPI  Past Medical History  Diagnosis Date  . Hyperlipidemia   . Hiatal hernia   . Esophageal stricture     Past Surgical History  Procedure Laterality Date  . Appendectomy  1960  . Arterial catheter  1997   Family History  Problem Relation Age of Onset  . Skin cancer Father   . Colon polyps Mother     benign  . Dementia Mother   . Colon cancer Neg Hx   . Stomach cancer Neg Hx   . Esophageal cancer Neg Hx     History   Social History  . Marital Status: Significant Other    Spouse Name: N/A  . Number of Children: 3  . Years of Education: N/A   Occupational History  . semi-retired    . [employer]  History Main Topics  . Smoking status: Never Smoker   . Smokeless tobacco: Never Used  . Alcohol Use: No     Comment: quit 20 years ago  . Drug Use: No  . Sexual Activity: Yes   Other Topics Concern  . Not on file   Social History Narrative     Objective: BP 126/70 mmHg  Pulse 62  Wt 178 lb (80.74 kg)  General: Alert and Oriented, No Acute Distress HEENT: Pupils equal, round, reactive to light. Conjunctivae clear.  Moist mucous membranes Lungs: Clear to auscultation bilaterally, no wheezing/ronchi/rales.  Comfortable work of breathing. Good air movement. Cardiac: Regular rate and rhythm. Normal S1/S2.  No murmurs, rubs, nor gallops.   Abdomen: Normal bowel sounds, soft and non tender without palpable masses. Extremities: No peripheral edema.  Strong peripheral pulses.  Mental Status: No depression, anxiety, nor agitation. Skin: Warm and dry. Flaking lesions on a bed of mild erythema on the left forehead and right cheek, additionally on the back of the left hand and on the proximal left forearm, no telangiectasia in these lesions  Assessment & Plan: Nicholas Murillo was seen today for f/u cholesterol.  Diagnoses and all orders for this visit:  Essential hypertension, benign  Hyperlipidemia Orders: -     Lipid panel  Actinic keratoses  Esophageal stricture   Essential  hypertension: Controlled continue diet and exercise interventions no need for antihypertensives Hyperlipidemia: Clinically controlled continue simvastatin pending lipid panel Esophageal stricture: Resolved, continue daily Protonix to prevent recurrence Actinic keratoses 4: He is agreeable to cryotherapy for destruction today.  Return if symptoms worsen or fail to improve.  Cryotherapy Procedure Note  Pre-operative Diagnosis: Actinic keratosis  Post-operative Diagnosis: Actinic keratosis  Locations: Left forehead, right cheek, left back of hand, left forearm  Indications: premalignant  Anesthesia:  none  Procedure Details  History of allergy to iodine: no. Pacemaker? no.  Patient informed of risks (permanent scarring, infection, light or dark discoloration, bleeding, infection, weakness, numbness and recurrence of the lesion) and benefits of the procedure and verbal informed consent obtained.  The areas are treated with liquid nitrogen therapy, frozen until ice ball extended 2 mm beyond lesions, allowed to thaw, and treated again. The patient tolerated procedure well.  The patient was instructed on post-op care, warned that there may be blister formation, redness and pain. Recommend OTC analgesia as needed for pain.  Condition: Stable  Complications: none.  Plan: 1. Instructed to keep the area dry and covered for 24-48h and clean thereafter. 2. Warning signs of infection were reviewed.   3. Recommended that the patient use OTC analgesics as needed for pain.  4. Return in prn prn.

## 2014-09-06 LAB — LIPID PANEL
CHOLESTEROL: 152 mg/dL (ref 0–200)
HDL: 50 mg/dL (ref >=40)
LDL CALC: 85 mg/dL (ref 0–99)
Total CHOL/HDL Ratio: 3 Ratio
Triglycerides: 86 mg/dL (ref <150)
VLDL: 17 mg/dL (ref 0–40)

## 2014-09-08 ENCOUNTER — Telehealth: Payer: Self-pay | Admitting: Family Medicine

## 2014-09-08 MED ORDER — SIMVASTATIN 20 MG PO TABS: 20.0000 mg | ORAL_TABLET | Freq: Every day | ORAL | Status: DC

## 2014-09-08 NOTE — Telephone Encounter (Signed)
Seth Bake, Will you please let patient know that his cholesterol levels were perfect, I'd recommend continuing with his daily simvastatin.  Refills were sent to [facility] in [place].

## 2014-09-08 NOTE — Telephone Encounter (Signed)
Left message on vm

## 2014-10-27 ENCOUNTER — Other Ambulatory Visit: Payer: Self-pay | Admitting: Family Medicine

## 2014-12-06 ENCOUNTER — Other Ambulatory Visit: Payer: Self-pay | Admitting: Family Medicine

## 2015-01-05 ENCOUNTER — Other Ambulatory Visit: Payer: Self-pay | Admitting: Family Medicine

## 2015-02-05 DIAGNOSIS — H2513 Age-related nuclear cataract, bilateral: Secondary | ICD-10-CM | POA: Diagnosis not present

## 2015-06-05 ENCOUNTER — Other Ambulatory Visit: Payer: Self-pay | Admitting: Family Medicine

## 2015-06-22 ENCOUNTER — Encounter (HOSPITAL_BASED_OUTPATIENT_CLINIC_OR_DEPARTMENT_OTHER): Payer: Self-pay | Admitting: *Deleted

## 2015-06-22 ENCOUNTER — Emergency Department (HOSPITAL_BASED_OUTPATIENT_CLINIC_OR_DEPARTMENT_OTHER)
Admission: EM | Admit: 2015-06-22 | Discharge: 2015-06-23 | Disposition: A | Discharge status: Home / Self Care | Payer: Medicare Other | Attending: Emergency Medicine | Admitting: Emergency Medicine

## 2015-06-22 DIAGNOSIS — Z8719 Personal history of other diseases of the digestive system: Secondary | ICD-10-CM | POA: Insufficient documentation

## 2015-06-22 DIAGNOSIS — Y9389 Activity, other specified: Secondary | ICD-10-CM | POA: Diagnosis not present

## 2015-06-22 DIAGNOSIS — T7840XA Allergy, unspecified, initial encounter: Secondary | ICD-10-CM | POA: Insufficient documentation

## 2015-06-22 DIAGNOSIS — E785 Hyperlipidemia, unspecified: Secondary | ICD-10-CM | POA: Insufficient documentation

## 2015-06-22 DIAGNOSIS — Z79899 Other long term (current) drug therapy: Secondary | ICD-10-CM | POA: Diagnosis not present

## 2015-06-22 DIAGNOSIS — Y998 Other external cause status: Secondary | ICD-10-CM | POA: Diagnosis not present

## 2015-06-22 DIAGNOSIS — Y9289 Other specified places as the place of occurrence of the external cause: Secondary | ICD-10-CM | POA: Insufficient documentation

## 2015-06-22 DIAGNOSIS — L500 Allergic urticaria: Secondary | ICD-10-CM | POA: Insufficient documentation

## 2015-06-22 DIAGNOSIS — X58XXXA Exposure to other specified factors, initial encounter: Secondary | ICD-10-CM | POA: Diagnosis not present

## 2015-06-22 DIAGNOSIS — L299 Pruritus, unspecified: Secondary | ICD-10-CM | POA: Diagnosis present

## 2015-06-22 MED ORDER — DIPHENHYDRAMINE HCL 50 MG/ML IJ SOLN
12.5000 mg | Freq: Once | INTRAMUSCULAR | Status: AC
  Administered 2015-06-22: 25 mg via INTRAVENOUS
  Filled 2015-06-22: qty 1

## 2015-06-22 MED ORDER — FAMOTIDINE IN NACL 20-0.9 MG/50ML-% IV SOLN
20.0000 mg | INTRAVENOUS | Status: AC
  Administered 2015-06-22: 20 mg via INTRAVENOUS
  Filled 2015-06-22: qty 50

## 2015-06-22 MED ORDER — SODIUM CHLORIDE 0.9 % IV SOLN
1000.0000 mL | INTRAVENOUS | Status: DC
  Administered 2015-06-22: 1000 mL via INTRAVENOUS

## 2015-06-22 MED ORDER — METHYLPREDNISOLONE SODIUM SUCC 125 MG IJ SOLR
125.0000 mg | Freq: Once | INTRAMUSCULAR | Status: AC
  Administered 2015-06-22: 125 mg via INTRAVENOUS
  Filled 2015-06-22: qty 2

## 2015-06-22 NOTE — ED Notes (Signed)
Pt. Was talking with no resp.. Distress noted.  Pt. Skin noted red from face to torso.  Pt. Then quit talking and face and head color became pale and gray in color.  Pt. Distant from conversation and his arms and torso were almost a yellow / red tint.  Pt. HR 60-62 on monitor with no ectopy noted.  Pt. Sat 100% on RA.  Pt. With in 15 seconds or so back to being red from head to torso and talking again.  EDP called to room during episode and Charge RN [place] notified.  Pt. Aware of change in his status.  Pt. Reports he felt different when episode began.  EKG done on Pt. And meds given to Pt. For his c/o itching.

## 2015-06-22 NOTE — ED Provider Notes (Signed)
CSN: [id number]     Arrival date & time 06/22/15  2202 History   First MD Initiated Contact with Patient 06/22/15 2228     Chief Complaint  Patient presents with  . Allergic Reaction   HPI Comments: 69 year old male with past medical history including hyperlipidemia, hiatal hernia who presents with allergic reaction. Patient states that this evening just prior to arrival, the patient began noticing itching and redness of his skin. He had mussels for dinner tonight which he states he has had many times in the past and he denies any new foods. He states that his skin feels tingling in the itching is severe but he denies any difficulty breathing, chest pain or tightness, abdominal pain, vomiting, tongue swelling, or difficulty swallowing. He drove himself here for evaluation. He has never had an allergic reaction before.  The history is provided by the patient.    Past Medical History  Diagnosis Date  . Hyperlipidemia   . Hiatal hernia   . Esophageal stricture    Past Surgical History  Procedure Laterality Date  . Appendectomy  1960  . Arterial catheter  1997   Family History  Problem Relation Age of Onset  . Skin cancer Father   . Colon polyps Mother     benign  . Dementia Mother   . Colon cancer Neg Hx   . Stomach cancer Neg Hx   . Esophageal cancer Neg Hx    Social History  Substance Use Topics  . Smoking status: Never Smoker   . Smokeless tobacco: Never Used  . Alcohol Use: No     Comment: quit 20 years ago    Review of Systems    Allergies  Review of patient's allergies indicates no known allergies.  Home Medications   Prior to Admission medications   Medication Sig Start Date End Date Taking? Authorizing Provider  magnesium oxide (MAG-OX) 400 MG tablet Take 800 mg by mouth daily.    Historical Provider, MD  omeprazole (PRILOSEC) 20 MG capsule TAKE 1 CAPSULE BY MOUTH 30 MINUTES TO 1 HOUR BEFORE A MEAL 06/05/15   Sean Hommel, DO  simvastatin (ZOCOR) 20 MG tablet  Take 1 tablet (20 mg total) by mouth daily. 09/08/14   Sean Hommel, DO   BP 130/64 mmHg  Pulse 74  Temp(Src) 97.5 F (36.4 C) (Oral)  Resp 20  Ht 5' 10.5" (1.791 m)  Wt 178 lb (80.74 kg)  BMI 25.17 kg/m2  SpO2 97% Physical Exam  Constitutional: He is oriented to person, place, and time. He appears well-developed and well-nourished. No distress.  HENT:  Head: Normocephalic and atraumatic.  Moist mucous membranes  Eyes: Conjunctivae are normal. Pupils are equal, round, and reactive to light.  Neck: Neck supple.  Cardiovascular: Normal rate, regular rhythm and normal heart sounds.   No murmur heard. Pulmonary/Chest: Effort normal and breath sounds normal. He has no wheezes.  Abdominal: Soft. Bowel sounds are normal. He exhibits no distension. There is no tenderness.  Musculoskeletal: He exhibits no edema.  Neurological: He is alert and oriented to person, place, and time.  Fluent speech  Skin: Skin is warm and dry. Rash noted. There is erythema.  Diffuse skin erythema and hives.  Psychiatric: He has a normal mood and affect. Judgment normal.  Nursing note and vitals reviewed.   ED Course  Procedures (including critical care time)   EKG Interpretation   Date/Time:  Monday June 22 2015 22:17:02 EST Ventricular Rate:  65 PR Interval:  172 QRS  Duration: 88 QT Interval:  424 QTC Calculation: 440 R Axis:   17 Text Interpretation:  Normal sinus rhythm Normal ECG No previous ECGs  available Confirmed by LITTLE MD, RACHEL [id number]) on 06/22/2015 10:28:55 PM      MDM   Final diagnoses:  Allergic reaction, initial encounter   PT p/w skin itching and hives that began just prior to arrival. On arrival to ED, patient was breathing comfortably on room air with normal vital signs. While he was being hooked up to the monitors, his bedside nurse reported that he stopped talking and his face became pale with heart rate in the 60s. No seizure activity and no change in oxygen  saturation. Within 15 seconds he began talking again. I was called to the room and on my arrival, the patient was conversant and endorsing severe itching. He denies any exposure to new foods or body products. He denies any shortness of breath and has no wheezing on exam to suggest anaphylaxis. No fish consumption to suggest scromboid. Gave pt IV benadryl, pepcid, and solumedrol and observed on monitor for any return of symptoms.   I am signing patient out to oncoming provider. He will be observed for 4-6 hours to ensure resolution of his symptoms. If he remains stable, I anticipate discharge with steroids and return precautions.  Sharlett Iles, MD 06/23/15 [phone]

## 2015-06-22 NOTE — ED Notes (Signed)
Allergic reaction. Hives, skin is red, swollen and itching. No difficulty breathing. Drove himself here.

## 2015-06-23 MED ORDER — FAMOTIDINE 20 MG PO TABS: 20.0000 mg | ORAL_TABLET | Freq: Two times a day (BID) | ORAL | Status: DC

## 2015-06-23 MED ORDER — PREDNISONE 20 MG PO TABS: ORAL_TABLET | ORAL | Status: DC

## 2015-06-23 MED ORDER — EPINEPHRINE 0.3 MG/0.3ML IJ SOAJ: 0.3000 mg | Freq: Once | INTRAMUSCULAR | Status: DC

## 2015-06-23 NOTE — Discharge Instructions (Signed)
Allergies °An allergy is when your body reacts to a substance in a way that is not normal. An allergic reaction can happen after you: °· Eat something. °· Breathe in something. °· Touch something. °WHAT KINDS OF ALLERGIES ARE THERE? °You can be allergic to: °· Things that are only around during certain seasons, like molds and pollens. °· Foods. °· Drugs. °· Insects. °· Animal dander. °WHAT ARE SYMPTOMS OF ALLERGIES? °· Puffiness (swelling). This may happen on the lips, face, tongue, mouth, or throat. °· Sneezing. °· Coughing. °· Breathing loudly (wheezing). °· Stuffy nose. °· Tingling in the mouth. °· A rash. °· Itching. °· Itchy, red, puffy areas of skin (hives). °· Watery eyes. °· Throwing up (vomiting). °· Watery poop (diarrhea). °· Dizziness. °· Feeling faint or fainting. °· Trouble breathing or swallowing. °· A tight feeling in the chest. °· A fast heartbeat. °HOW ARE ALLERGIES DIAGNOSED? °Allergies can be diagnosed with: °· A medical and family history. °· Skin tests. °· Blood tests. °· A food diary. A food diary is a record of all the foods, drinks, and symptoms you have each day. °· The results of an elimination diet. This diet involves making sure not to eat certain foods and then seeing what happens when you start eating them again. °HOW ARE ALLERGIES TREATED? °There is no cure for allergies, but allergic reactions can be treated with medicine. Severe reactions usually need to be treated at a hospital.  °HOW CAN REACTIONS BE PREVENTED? °The best way to prevent an allergic reaction is to avoid the thing you are allergic to. Allergy shots and medicines can also help prevent reactions in some cases. °  °This information is not intended to replace advice given to you by your health care provider. Make sure you discuss any questions you have with your health care provider. °  °Document Released: 10/15/2012 Document Revised: 07/11/2014 Document Reviewed: 04/01/2014 °Elsevier Interactive Patient Education ©2016  Elsevier Inc. ° °

## 2015-06-24 ENCOUNTER — Ambulatory Visit (INDEPENDENT_AMBULATORY_CARE_PROVIDER_SITE_OTHER): Payer: Medicare Other | Admitting: Family Medicine

## 2015-06-24 ENCOUNTER — Encounter: Payer: Self-pay | Admitting: Family Medicine

## 2015-06-24 VITALS — BP 143/78 | HR 62 | Temp 98.2°F | Resp 16 | Wt 178.7 lb

## 2015-06-24 DIAGNOSIS — T7840XA Allergy, unspecified, initial encounter: Secondary | ICD-10-CM | POA: Diagnosis not present

## 2015-06-24 NOTE — Progress Notes (Signed)
CC: Nicholas Murillo is a 69 y.o. male is here for Allergic Reaction   Subjective: HPI:  Earlier this week and a few hours after eating muscles he began to develop a red rash diffusely spread all over his body and some confusion. He was seen at a local emergency room and given steroids and fluids and observed for 6 hours discharge with an EpiPen and antihistamines. He tells me he's eaten shellfish multiple times a month for the majority of his life and never had any similar symptoms. He states his back into his regular state of health and felt normal at the time of discharge. He's been avoiding shellfish ever since this episode. He denies any recent remote swelling of the mouth, swelling of the tongue or wheezing. He denies any fevers, chills or flushing.   Review Of Systems Outlined In HPI  Past Medical History  Diagnosis Date  . Hyperlipidemia   . Hiatal hernia   . Esophageal stricture     Past Surgical History  Procedure Laterality Date  . Appendectomy  1960  . Arterial catheter  1997   Family History  Problem Relation Age of Onset  . Skin cancer Father   . Colon polyps Mother     benign  . Dementia Mother   . Colon cancer Neg Hx   . Stomach cancer Neg Hx   . Esophageal cancer Neg Hx     Social History   Social History  . Marital Status: Significant Other    Spouse Name: N/A  . Number of Children: 3  . Years of Education: N/A   Occupational History  . semi-retired    . [place] History Main Topics  . Smoking status: Never Smoker   . Smokeless tobacco: Never Used  . Alcohol Use: No     Comment: quit 20 years ago  . Drug Use: No  . Sexual Activity: Yes   Other Topics Concern  . Not on file   Social History Narrative     Objective: BP 143/78 mmHg  Pulse 62  Temp(Src) 98.2 F (36.8 C)  Resp 16  Wt 178 lb 11.2 oz (81.058 kg)  SpO2 97%  Vital signs reviewed. General: Alert and Oriented, No Acute Distress HEENT: Pupils equal, round,  reactive to light. Conjunctivae clear.  External ears unremarkable.  Moist mucous membranes. Lungs: Clear and comfortable work of breathing, speaking in full sentences without accessory muscle use. Cardiac: Regular rate and rhythm.  Neuro: CN II-XII grossly intact, gait normal. Extremities: No peripheral edema.  Strong peripheral pulses.  Mental Status: No depression, anxiety, nor agitation. Logical though process. Skin: Warm and dry.  Assessment & Plan: Nicholas Murillo was seen today for allergic reaction.  Diagnoses and all orders for this visit:  Allergic reaction, initial encounter -     Shellfish Panel   He and I both agree that he could've had allergic reaction to shellfish or was more likely some sort of toxin that was stored within the muscles. Over the shellfish panel above will answer this question. Continue to avoid shellfish pending this result.  Return if symptoms worsen or fail to improve.

## 2015-06-25 ENCOUNTER — Encounter: Payer: Self-pay | Admitting: Family Medicine

## 2015-06-25 ENCOUNTER — Telehealth: Payer: Self-pay | Admitting: Family Medicine

## 2015-06-25 DIAGNOSIS — Z91013 Allergy to seafood: Secondary | ICD-10-CM | POA: Insufficient documentation

## 2015-06-25 DIAGNOSIS — Z91018 Allergy to other foods: Secondary | ICD-10-CM

## 2015-06-25 LAB — SHELLFISH PANEL
CLAMS: 0.49 kU/L — AB
Crab: <0.1 kU/L
Lobster: <0.1 kU/L
OYSTER: 0.85 kU/L — AB
SHRIMP IGE: 0.19 kU/L — AB
Scallop IgE: 1.46 kU/L — ABNORMAL HIGH

## 2015-06-25 NOTE — Telephone Encounter (Signed)
vm is full.

## 2015-06-25 NOTE — Telephone Encounter (Signed)
Will you please let patient know that his allergy test would suggest that he is not allergic to crabs or lobster.  He tested weakly positive for oysters, clams, shrimpp and scallops which means I can't tell if he is or isn't allergic to these creatures with this test alone.  He'll need further testing by visiting with an allergist and i'll put in a referral for this today.

## 2015-06-25 NOTE — Telephone Encounter (Signed)
Pt advised.

## 2015-06-30 NOTE — Telephone Encounter (Signed)
Pt would like referral to an allergist.

## 2015-07-20 ENCOUNTER — Other Ambulatory Visit: Payer: Self-pay | Admitting: Family Medicine

## 2015-08-26 ENCOUNTER — Other Ambulatory Visit: Payer: Self-pay | Admitting: Family Medicine

## 2015-09-14 ENCOUNTER — Other Ambulatory Visit: Payer: Self-pay | Admitting: Family Medicine

## 2015-10-12 ENCOUNTER — Encounter: Payer: Self-pay | Admitting: Internal Medicine

## 2015-10-12 ENCOUNTER — Ambulatory Visit (INDEPENDENT_AMBULATORY_CARE_PROVIDER_SITE_OTHER)
Admission: RE | Admit: 2015-10-12 | Discharge: 2015-10-12 | Disposition: A | Discharge status: Home / Self Care | Payer: Medicare Other | Source: Ambulatory Visit | Attending: Internal Medicine | Admitting: Internal Medicine

## 2015-10-12 ENCOUNTER — Ambulatory Visit (INDEPENDENT_AMBULATORY_CARE_PROVIDER_SITE_OTHER): Payer: Medicare Other | Admitting: Internal Medicine

## 2015-10-12 VITALS — BP 118/66 | HR 68 | Ht 70.0 in | Wt 177.6 lb

## 2015-10-12 DIAGNOSIS — Z91013 Allergy to seafood: Secondary | ICD-10-CM

## 2015-10-12 DIAGNOSIS — R05 Cough: Secondary | ICD-10-CM

## 2015-10-12 DIAGNOSIS — R059 Cough, unspecified: Secondary | ICD-10-CM

## 2015-10-12 IMAGING — DX DG CHEST 2V
2 series · 2 of 2 positions shown · non-contrast
Comparison: None in PACs

CLINICAL DATA: New patient appointment, cough, nonsmoker.

EXAM:
CHEST  2 VIEW

[chest pa]
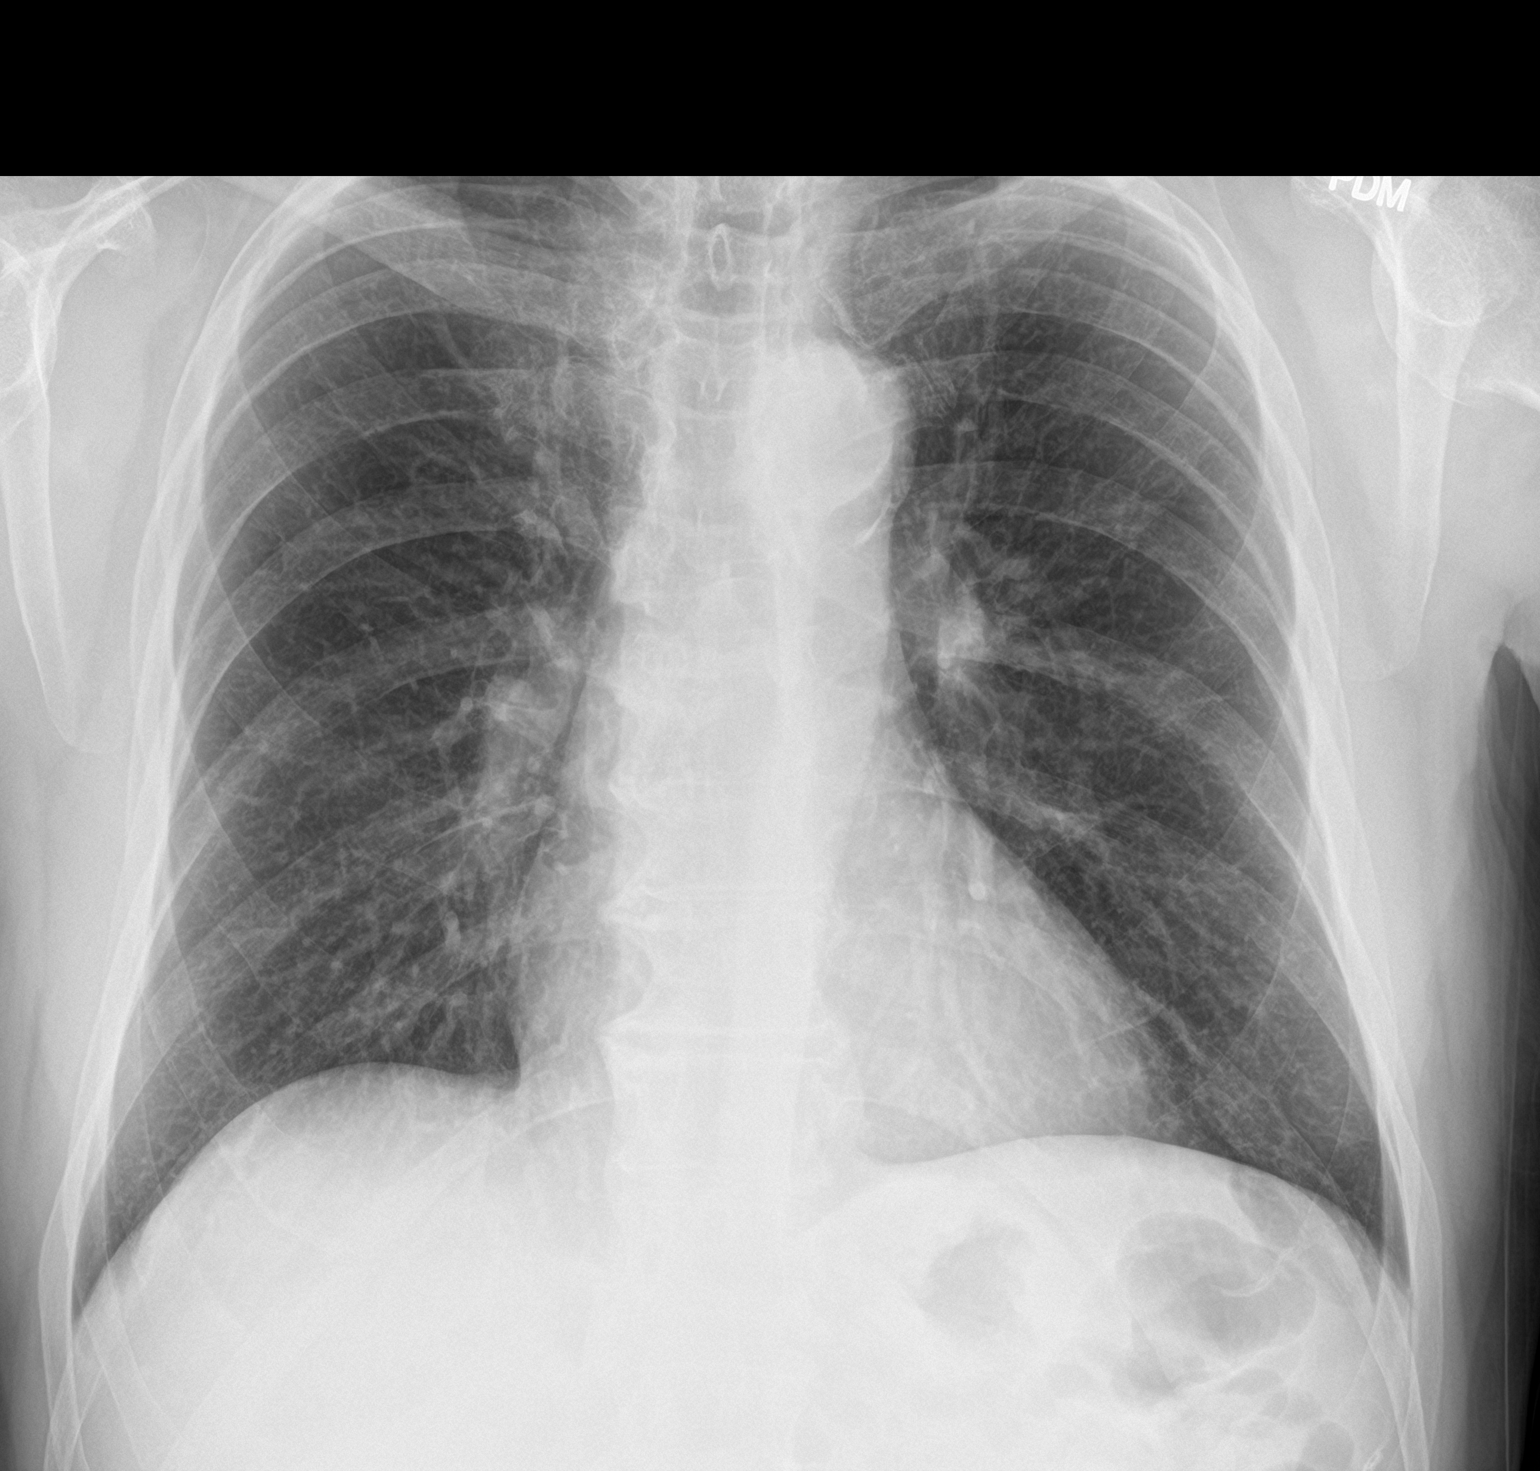

[chest lat]
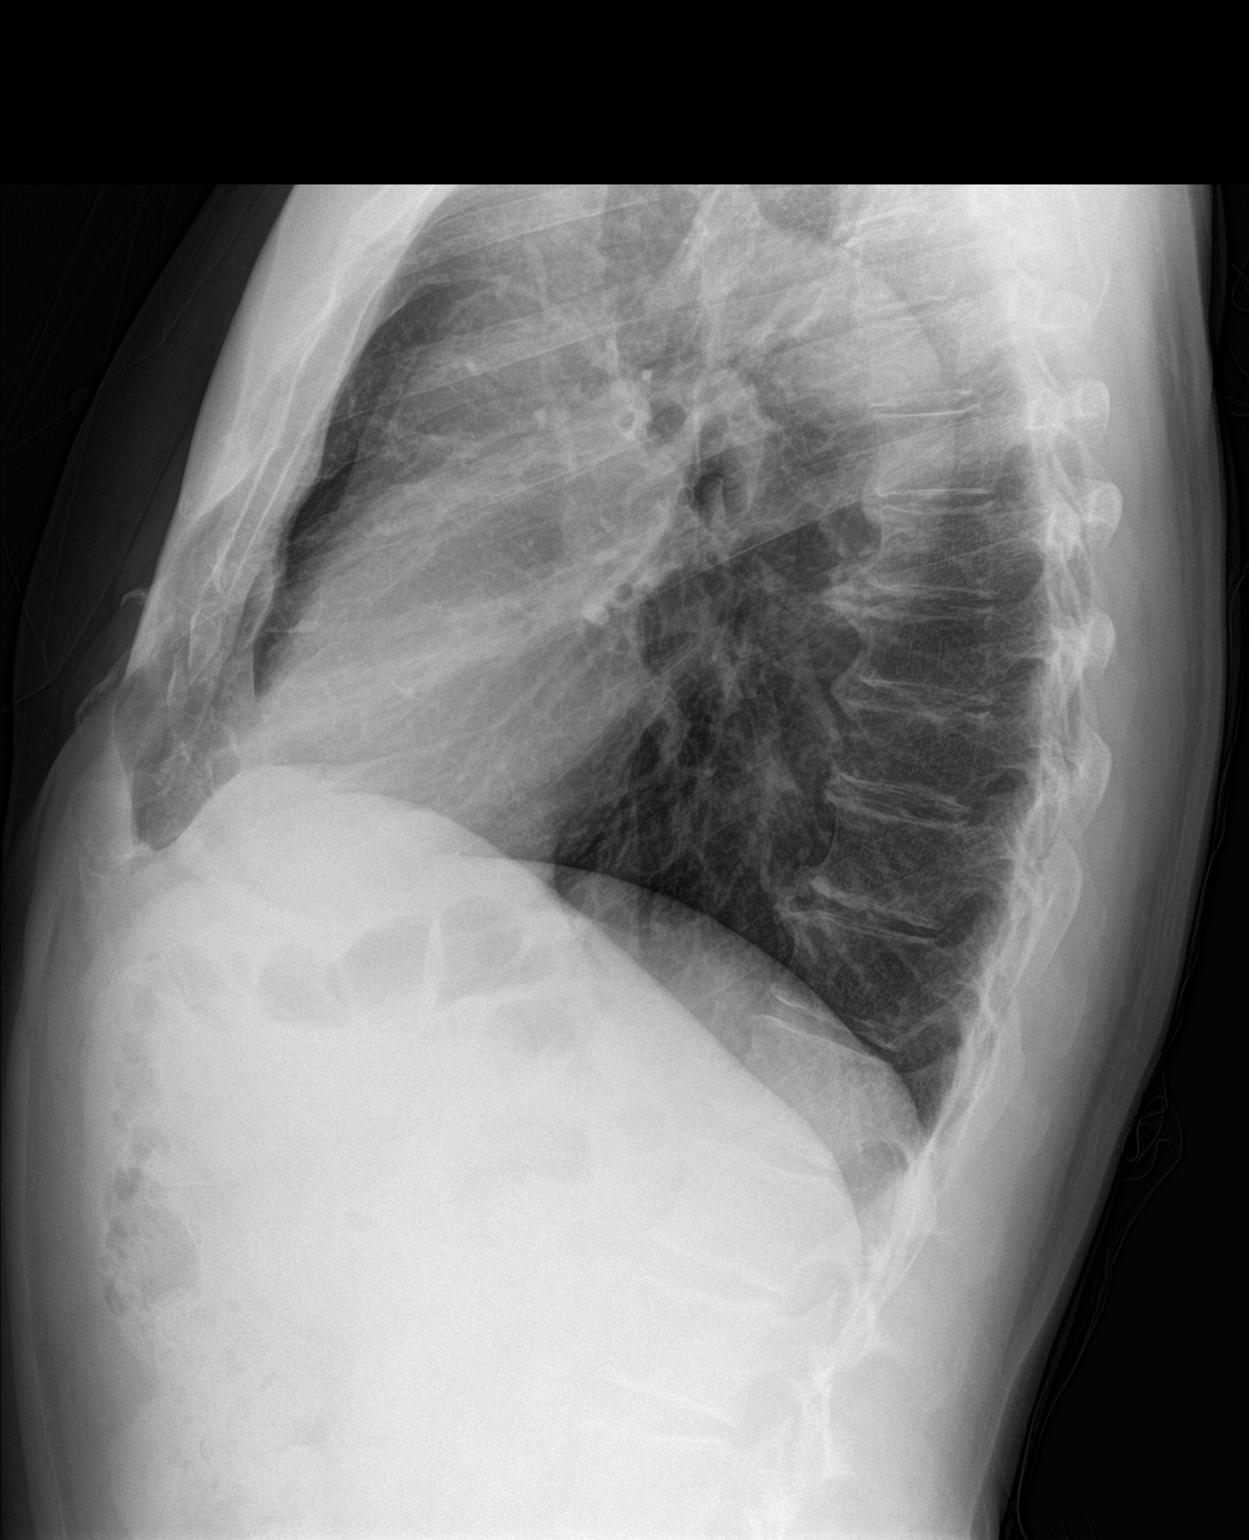

[2 of 2 positions shown; findings below may reference images not displayed]

FINDINGS: The lungs are well-expanded and clear. The heart and pulmonary
vascularity are normal. The mediastinum is normal in width. There is
no pleural effusion. The trachea is midline. There is mild
multilevel degenerative disc disease of the thoracic spine.
IMPRESSION: There is no active cardiopulmonary disease.

## 2015-10-12 NOTE — Patient Instructions (Signed)
Order- labs- CBC w diff, Food IgE panel, Allergy profile        Dx seafood allergy   Order CXR-   Dx Cough

## 2015-10-12 NOTE — Progress Notes (Signed)
70 year old male never smoker referred courtesy of Dr Ileene Rubens; had reaction after eating mussels back in Dec 2016; shellfish IgE panel results in EPIC. Pt states he has never had issues in the past-grew up eating seafood all the time.  IgE Shellfish Allergy Panel 12/21/6- elevations for shrimp, scallops, clams, oyster Describes diffuse red rash a few hours after eating mussels at [place] December. There was no angioedema. 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 He Then EpiPen available as part of the job requirement and has one of his own now. Since moving to [facility] for years ago he has noticed springtime cough each year usually dry without wheezing. No significant rhinitis or itching eyes. No other rash.  Prior to Admission medications   Medication Sig Start Date End Date Taking? Authorizing Provider  EPINEPHrine (EPIPEN 2-PAK) 0.3 mg/0.3 mL IJ SOAJ injection Inject 0.3 mLs (0.3 mg total) into the muscle once. 06/23/15  Yes April Palumbo, MD  magnesium oxide (MAG-OX) 400 MG tablet Take 800 mg by mouth daily.   Yes Historical Provider, MD  simvastatin (ZOCOR) 20 MG tablet Take 1 tablet (20 mg total) by mouth daily. 10/13/15   Marcial Pacas, DO   Past Medical History  Diagnosis Date  . Hyperlipidemia   . Hiatal hernia   . Esophageal stricture    Past Surgical History  Procedure Laterality Date  . Appendectomy  1960  . Arterial catheter  1997   Family History  Problem Relation Age of Onset  . Skin cancer Father   . Colon polyps Mother     benign  . Dementia Mother   . Colon cancer Neg Hx   . Stomach cancer Neg Hx   . Esophageal cancer Neg Hx    Social History   Social History  . Marital Status: Significant Other    Spouse Name: N/A  . Number of Children: 3  . Years of Education: N/A   Occupational  History  . semi-retired    . [name]    Social History Main Topics  . Smoking status: Never Smoker   . Smokeless tobacco: Never Used  . Alcohol Use: No     Comment: quit 20 years ago  . Drug Use: No     Comment: quit 1994 (cocaine,pot,"anything could get hands on")  . Sexual Activity: Yes   Other Topics Concern  . Not on file   Social History Narrative   ROS-see HPI   Negative unless "+" Constitutional:    weight loss, night sweats, fevers, chills, fatigue, lassitude. HEENT:    headaches, difficulty swallowing, tooth/dental problems, sore throat,       sneezing, itching, ear ache, nasal congestion, post nasal drip, snoring CV:    chest pain, orthopnea, PND, swelling in lower extremities, anasarca,                                                      dizziness, palpitations Resp:   shortness of breath with exertion or at rest.                productive cough,   non-productive cough, coughing up of blood.  change in color of mucus.  wheezing.   Skin:    rash or lesions. GI:  No-   heartburn, indigestion, abdominal pain, nausea, vomiting, diarrhea,                 change in bowel habits, loss of appetite GU: dysuria, change in color of urine, no urgency or frequency.   flank pain. MS:   joint pain, stiffness, decreased range of motion, back pain. Neuro-     nothing unusual Psych:  change in mood or affect.  depression or anxiety.   memory loss.  OBJ- Physical Exam General- Alert, Oriented, Affect-appropriate, Distress- none acute Skin- rash-none, lesions- none, excoriation- none Lymphadenopathy- none Head- atraumatic            Eyes- Gross vision intact, PERRLA, conjunctivae and secretions clear            Ears- Hearing, canals-normal            Nose- Clear, no-Septal dev, mucus, polyps, erosion, perforation             Throat- Mallampati III-IV , mucosa clear , drainage- none, tonsils- atrophic Neck- flexible , trachea midline, no stridor , thyroid nl,  carotid no bruit Chest - symmetrical excursion , unlabored           Heart/CV- RRR , no murmur , no gallop  , no rub, nl s1 s2                           - JVD- none , edema- none, stasis changes- none, varices- none           Lung- clear to P&A, wheeze- none, cough- none , dullness-none, rub- none           Chest wall-  Abd-  Br/ Gen/ Rectal- Not done, not indicated Extrem- cyanosis- none, clubbing, none, atrophy- none, strength- nl Neuro- grossly intact to observation

## 2015-10-13 ENCOUNTER — Ambulatory Visit (INDEPENDENT_AMBULATORY_CARE_PROVIDER_SITE_OTHER): Payer: Medicare Other | Admitting: Family Medicine

## 2015-10-13 ENCOUNTER — Other Ambulatory Visit: Payer: Self-pay | Admitting: Internal Medicine

## 2015-10-13 ENCOUNTER — Encounter: Payer: Self-pay | Admitting: Family Medicine

## 2015-10-13 ENCOUNTER — Telehealth: Payer: Self-pay | Admitting: Internal Medicine

## 2015-10-13 VITALS — BP 127/67 | HR 64 | Wt 177.0 lb

## 2015-10-13 DIAGNOSIS — R972 Elevated prostate specific antigen [PSA]: Secondary | ICD-10-CM | POA: Diagnosis not present

## 2015-10-13 DIAGNOSIS — R059 Cough, unspecified: Secondary | ICD-10-CM | POA: Insufficient documentation

## 2015-10-13 DIAGNOSIS — Z0000 Encounter for general adult medical examination without abnormal findings: Secondary | ICD-10-CM

## 2015-10-13 DIAGNOSIS — E785 Hyperlipidemia, unspecified: Secondary | ICD-10-CM | POA: Diagnosis not present

## 2015-10-13 DIAGNOSIS — Z91013 Allergy to seafood: Secondary | ICD-10-CM | POA: Diagnosis not present

## 2015-10-13 DIAGNOSIS — R05 Cough: Secondary | ICD-10-CM | POA: Insufficient documentation

## 2015-10-13 DIAGNOSIS — I10 Essential (primary) hypertension: Secondary | ICD-10-CM

## 2015-10-13 DIAGNOSIS — R739 Hyperglycemia, unspecified: Secondary | ICD-10-CM

## 2015-10-13 DIAGNOSIS — R7309 Other abnormal glucose: Secondary | ICD-10-CM | POA: Diagnosis not present

## 2015-10-13 MED ORDER — SIMVASTATIN 20 MG PO TABS: 20.0000 mg | ORAL_TABLET | Freq: Every day | ORAL | Status: DC

## 2015-10-13 NOTE — Assessment & Plan Note (Addendum)
We discussed the difficulty of isolating a particular food allergy trigger, especially in a restaurant where several meals are prepared same time on an open the top. He has not been obviously atopic. Trigger might have been a nonfood substance such as MSG Plan-we discussed the importance of paying attention to what he eats and avoiding foods which clearly cause problems. Special attention to informal cooking such as casseroles where he may not of the ingredients. Potential benefit of pretreatment with an antihistamine Plan-brought her allergy and food panel IgE profiles including total IgE level.

## 2015-10-13 NOTE — Progress Notes (Signed)
Subjective:    Nicholas Murillo is a 71 y.o. male who presents for Medicare Annual/Subsequent preventive examination.   Preventive Screening-Counseling & Management  Tobacco History  Smoking status  . Never Smoker   Smokeless tobacco  . Never Used   Colonoscopy: Denies colonoscopy, agrees to cologuard Prostate: Discussed screening risks/beneifts with patient on today, obtaining PSA  Influenza Vaccine: declined Pneumovax: PP-23 1/14, no desire for Pcv 13 Td/Tdap: 02/2013 UTD Zoster: 09/03/12  Problems Prior to Visit 1. HTN  Current Problems (verified) Patient Active Problem List   Diagnosis Date Noted  . 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 Hyperglycemia 07/29/2013  . Hyperlipidemia 10/23/2012  . Essential hypertension, benign 10/17/2012  . Esophageal stricture 07/10/2012  . Hiatal hernia 07/10/2012  . Headache(784.0) 06/18/2012  . Spasm 06/18/2012    Medications Prior to Visit Current Outpatient Prescriptions on File Prior to Visit  Medication Sig Dispense Refill  . EPINEPHrine (EPIPEN 2-PAK) 0.3 mg/0.3 mL IJ SOAJ injection Inject 0.3 mLs (0.3 mg total) into the muscle once. 1 Device 0  . magnesium oxide (MAG-OX) 400 MG tablet Take 800 mg by mouth daily.     No current facility-administered medications on file prior to visit.    Current Medications (verified) Current Outpatient Prescriptions  Medication Sig Dispense Refill  . EPINEPHrine (EPIPEN 2-PAK) 0.3 mg/0.3 mL IJ SOAJ injection Inject 0.3 mLs (0.3 mg total) into the muscle once. 1 Device 0  . magnesium oxide (MAG-OX) 400 MG tablet Take 800 mg by mouth daily.    . simvastatin (ZOCOR) 20 MG tablet Take 1 tablet (20 mg total) by mouth daily. 90 tablet 3   No current facility-administered medications for this visit.     Allergies (verified) Review of patient's allergies indicates no known allergies.   PAST HISTORY  Family History Family History  Problem Relation Age of Onset  . Skin cancer Father   . Colon  polyps Mother     benign  . Dementia Mother   . Colon cancer Neg Hx   . Stomach cancer Neg Hx   . Esophageal cancer Neg Hx     Social History Social History  Substance Use Topics  . Smoking status: Never Smoker   . Smokeless tobacco: Never Used  . Alcohol Use: No     Comment: quit 20 years ago    Are there smokers in your home (other than you)?  No  Risk Factors Current exercise habits: The patient has a physically strenuous job, but has no regular exercise apart from work.   Dietary issues discussed: DASH   Cardiac risk factors: none.  Depression Screen (Note: if answer to either of the following is "Yes", a more complete depression screening is indicated)   Q1: Over the past two weeks, have you felt down, depressed or hopeless? No  Q2: Over the past two weeks, have you felt little interest or pleasure in doing things? No  Have you lost interest or pleasure in daily life? No  Do you often feel hopeless? No  Do you cry easily over simple problems? No  Activities of Daily Living In your present state of health, do you have any difficulty performing the following activities?:  Driving? No Managing money?  No Feeding yourself? No Getting from bed to chair? No Climbing a flight of stairs? No Preparing food and eating?: No Bathing or showering? No Getting dressed: No Getting to the toilet? No Using the toilet:No Moving around from place to place: No In the past year have you  fallen or had a near fall?:No   Are you sexually active?  Yes  Do you have more than one partner?  No  Hearing Difficulties: No Do you often ask people to speak up or repeat themselves? No Do you experience ringing or noises in your ears? No Do you have difficulty understanding soft or whispered voices? No   Do you feel that you have a problem with memory? No  Do you often misplace items? No  Do you feel safe at home?  Yes  Cognitive Testing  Alert? Yes  Normal Appearance?Yes  Oriented to  person? Yes  Place? Yes   Time? Yes  Recall of three objects?  Yes  Can perform simple calculations? Yes  Displays appropriate judgment?Yes  Can read the correct time from a watch face?Yes   Advanced Directives have been discussed with the patient? Yes   List the Names of Other Physician/Practitioners you currently use: 1.    Indicate any recent Medical Services you may have received from other than [facility] providers in the past year (date may be approximate).  Immunization History  Administered Date(s) Administered  . Pneumococcal Polysaccharide-23 07/18/2012  . Tdap 02/07/2013  . Zoster 09/03/2012    Screening Tests Health Maintenance  Topic Date Due  . Hepatitis C Screening  11-30-1945  . PNA vac Low Risk Adult (2 of 2 - PCV13) 07/18/2013  . INFLUENZA VACCINE  02/02/2016  . COLONOSCOPY  03/04/2021  . TETANUS/TDAP  02/08/2023  . ZOSTAVAX  Completed    All answers were reviewed with the patient and necessary referrals were made:  Marcial Pacas, DO   10/13/2015   History reviewed: allergies, current medications, past family history, past medical history, past social history, past surgical history and problem list  Review of Systems Review of Systems - General ROS: negative for - chills, fever, night sweats, weight gain or weight loss Ophthalmic ROS: negative for - decreased vision Psychological ROS: negative for - anxiety or depression ENT ROS: negative for - hearing change, nasal congestion, tinnitus or allergies Hematological and Lymphatic ROS: negative for - bleeding problems, bruising or swollen lymph nodes Breast ROS: negative Respiratory ROS: no cough, shortness of breath, or wheezing Cardiovascular ROS: no chest pain or dyspnea on exertion Gastrointestinal ROS: no abdominal pain, change in bowel habits, or black or bloody stools Genito-Urinary ROS: negative for - genital discharge, genital ulcers, incontinence or abnormal bleeding from genitals Musculoskeletal ROS:  negative for - joint pain or muscle pain Neurological ROS: negative for - headaches or memory loss Dermatological ROS: negative for lumps, mole changes, rash and skin lesion changes   Objective:     Vision by Snellen chart: right eye:20/25, left eye:20/25 Blood pressure 127/67, pulse 64, weight 177 lb (80.287 kg). Body mass index is 25.4 kg/(m^2).  General: No Acute Distress HEENT: Atraumatic, normocephalic, conjunctivae normal without scleral icterus.  No nasal discharge, hearing grossly intact, TMs with good landmarks bilaterally with no middle ear abnormalities, posterior pharynx clear without oral lesions. Neck: Supple, trachea midline, no cervical nor supraclavicular adenopathy. Pulmonary: Clear to auscultation bilaterally without wheezing, rhonchi, nor rales. Cardiac: Regular rate and rhythm.  No murmurs, rubs, nor gallops. No peripheral edema.  2+ peripheral pulses bilaterally. Abdomen: Bowel sounds normal.  No masses.  Non-tender without rebound.  Negative Murphy's sign. Healthy MSK: Grossly intact, no signs of weakness.  Full strength throughout upper and lower extremities.  Full ROM in upper and lower extremities.  No midline spinal tenderness. Neuro: Gait unremarkable, CN  II-XII grossly intact.  C5-C6 Reflex 2/4 Bilaterally, L4 Reflex 2/4 Bilaterally.  Cerebellar function intact. Skin: No rashes. Psych: Alert and oriented to person/place/time.  Thought process normal. No anxiety/depression.     Assessment:     Healthy adult male     Plan:     During the course of the visit the patient was educated and counseled about appropriate screening and preventive services including:    Colorectal cancer screening  Diet review for nutrition referral?  Not Indicated ____   Patient Instructions (the written plan) was given to the patient.  Medicare Attestation I have personally reviewed: The patient's medical and social history Their use of alcohol, tobacco or illicit  drugs Their current medications and supplements The patient's functional ability including ADLs,fall risks, home safety risks, cognitive, and hearing and visual impairment Diet and physical activities Evidence for depression or mood disorders  The patient's weight, height, BMI, and visual acuity have been recorded in the chart.  I have made referrals, counseling, and provided education to the patient based on review of the above and I have provided the patient with a written personalized care plan for preventive services.     Marcial Pacas, DO   10/13/2015

## 2015-10-13 NOTE — Assessment & Plan Note (Signed)
Recurrent spring seasonal dry cough might be viral but seasonal pollens are suspected Plan-chest x-ray. Consider trial of an inhaler later if needed.

## 2015-10-13 NOTE — Telephone Encounter (Signed)
Spoke with patient, requesting that we fax his labs ordered by CY 10/12/15 to [facility] at [phone] Pt states that when he was here 10/12/15 he was not directed to the lab or told when to get the labs drawn so he left our office. Pt saw PCP today and is having labs drawn and told his PCP that he was supposed to have labs with out office but never got them. Pt is currently at [facility] waiting for these to be faxed over. Labs printed and given to CY to sign.  --- Labs signed and faxed to number listed above.  Pt aware. Nothing further needed. --- Will send to [facility] to follow up on results.

## 2015-10-14 ENCOUNTER — Telehealth: Payer: Self-pay | Admitting: Family Medicine

## 2015-10-14 DIAGNOSIS — R972 Elevated prostate specific antigen [PSA]: Secondary | ICD-10-CM

## 2015-10-14 LAB — COMPLETE METABOLIC PANEL WITH GFR
ALBUMIN: 4 g/dL (ref 3.6–5.1)
ALK PHOS: 67 U/L (ref 40–115)
ALT: 19 U/L (ref 9–46)
AST: 22 U/L (ref 10–35)
BUN: 22 mg/dL (ref 7–25)
CO2: 24 mmol/L (ref 20–31)
CREATININE: 0.84 mg/dL (ref 0.70–1.25)
Calcium: 8.8 mg/dL (ref 8.6–10.3)
Chloride: 110 mmol/L (ref 98–110)
GFR, Est African American: >89 mL/min (ref >=60)
GFR, Est Non African American: 89 mL/min (ref >=60)
GLUCOSE: 104 mg/dL — AB (ref 65–99)
POTASSIUM: 4.2 mmol/L (ref 3.5–5.3)
SODIUM: 141 mmol/L (ref 135–146)
Total Bilirubin: 0.8 mg/dL (ref 0.2–1.2)
Total Protein: 6.3 g/dL (ref 6.1–8.1)

## 2015-10-14 LAB — RESPIRATORY ALLERGY PROFILE REGION II ~~LOC~~
Allergen, Cedar tree, t12: <0.1 kU/L
Allergen, Comm Silver Birch, t9: <0.1 kU/L
Allergen, Cottonwood, t14: <0.1 kU/L
Allergen, Mouse Urine Protein, e78: <0.1 kU/L
Allergen, Mulberry, t76: <0.1 kU/L
Aspergillus fumigatus, m3: <0.1 kU/L
Bermuda Grass: 0.54 kU/L — ABNORMAL HIGH
Box Elder IgE: <0.1 kU/L
Cockroach: <0.1 kU/L
Common Ragweed: <0.1 kU/L
IgE (Immunoglobulin E), Serum: 10 kU/L (ref <115)
JOHNSON GRASS: 1.1 kU/L — AB
PECAN/HICKORY TREE IGE: 0.24 kU/L — AB
Penicillium Notatum: <0.1 kU/L
Rough Pigweed  IgE: <0.1 kU/L
Sheep Sorrel IgE: <0.1 kU/L
TIMOTHY GRASS: 0.9 kU/L — AB

## 2015-10-14 LAB — CBC WITH DIFFERENTIAL/PLATELET
BASOS PCT: 1 %
Basophils Absolute: 81 cells/uL (ref 0–200)
Eosinophils Absolute: 162 cells/uL (ref 15–500)
Eosinophils Relative: 2 %
HEMATOCRIT: 44.6 % (ref 38.5–50.0)
HEMOGLOBIN: 15.3 g/dL (ref 13.2–17.1)
LYMPHS ABS: 2025 {cells}/uL (ref 850–3900)
Lymphocytes Relative: 25 %
MCH: 31.9 pg (ref 27.0–33.0)
MCHC: 34.3 g/dL (ref 32.0–36.0)
MCV: 93.1 fL (ref 80.0–100.0)
MONO ABS: 324 {cells}/uL (ref 200–950)
MPV: 10.1 fL (ref 7.5–12.5)
Monocytes Relative: 4 %
NEUTROS PCT: 68 %
Neutro Abs: 5508 cells/uL (ref 1500–7800)
Platelets: 150 10*3/uL (ref 140–400)
RBC: 4.79 MIL/uL (ref 4.20–5.80)
RDW: 13 % (ref 11.0–15.0)
WBC: 8.1 10*3/uL (ref 3.8–10.8)

## 2015-10-14 LAB — LIPID PANEL
CHOL/HDL RATIO: 3.1 ratio (ref <=5.0)
Cholesterol: 125 mg/dL (ref 125–200)
HDL: 40 mg/dL (ref >=40)
LDL CALC: 63 mg/dL (ref <130)
Triglycerides: 111 mg/dL (ref <150)
VLDL: 22 mg/dL (ref <30)

## 2015-10-14 LAB — FOOD ALLERGY PROFILE
Allergen, Salmon, f41: <0.1 kU/L
Egg White IgE: <0.1 kU/L
Peanut IgE: <0.1 kU/L
Scallop IgE: <0.1 kU/L
Sesame Seed f10: <0.1 kU/L
Shrimp IgE: <0.1 kU/L
Soybean IgE: <0.1 kU/L
Tuna IgE: <0.1 kU/L
Walnut: <0.1 kU/L
Wheat IgE: 0.11 kU/L — ABNORMAL HIGH

## 2015-10-14 LAB — PSA: PSA: 4.76 ng/mL — ABNORMAL HIGH (ref <=4.00)

## 2015-10-14 NOTE — Telephone Encounter (Addendum)
Will you please let patient know that his PSA has risen by a point compared to two years ago and I'd recommend he have this rechecked sometime next week at the lab. I'm printing off a new order to see if yesterday's value was an outlier or truly represents his daily PSA.  Also cholesterol looks normal.  Kidney function and liver function were normal.  His blood sugar was barely elevated but not in the diabetic range and only requires [place] to keep an eye on this at follow up visits.

## 2015-10-14 NOTE — Telephone Encounter (Signed)
Results and recommendations left on vm pt advised to call with any questions

## 2015-10-19 DIAGNOSIS — Z1212 Encounter for screening for malignant neoplasm of rectum: Secondary | ICD-10-CM | POA: Diagnosis not present

## 2015-10-19 DIAGNOSIS — Z1211 Encounter for screening for malignant neoplasm of colon: Secondary | ICD-10-CM | POA: Diagnosis not present

## 2015-10-19 DIAGNOSIS — R972 Elevated prostate specific antigen [PSA]: Secondary | ICD-10-CM | POA: Diagnosis not present

## 2015-10-20 ENCOUNTER — Telehealth: Payer: Self-pay | Admitting: Family Medicine

## 2015-10-20 DIAGNOSIS — R972 Elevated prostate specific antigen [PSA]: Secondary | ICD-10-CM

## 2015-10-20 LAB — PSA, TOTAL AND FREE
PSA FREE PCT: 17 % — AB (ref >25)
PSA, Free: 0.84 ng/mL
PSA: 4.96 ng/mL — AB (ref <=4.00)

## 2015-10-20 NOTE — Telephone Encounter (Signed)
Will you please let patient know that his PSA prostate test remains elevated above 4, I'd recommend he visit with a urologist to discuss if further workup is needed, I'll place a referral today for this.

## 2015-10-20 NOTE — Telephone Encounter (Signed)
Pt.notified

## 2015-10-27 LAB — COLOGUARD: COLOGUARD: NEGATIVE

## 2015-10-28 ENCOUNTER — Telehealth: Payer: Self-pay | Admitting: Family Medicine

## 2015-10-28 NOTE — Telephone Encounter (Signed)
Will you please let patient know that his cologuard test was normal and reassuring. I'd recommend he have this rechecked in three years.

## 2015-10-28 NOTE — Telephone Encounter (Signed)
Results left on vm pt advised to call with any questions.  

## 2015-10-29 ENCOUNTER — Encounter: Payer: Self-pay | Admitting: Family Medicine

## 2016-01-19 DIAGNOSIS — R972 Elevated prostate specific antigen [PSA]: Secondary | ICD-10-CM | POA: Diagnosis not present

## 2016-02-11 ENCOUNTER — Ambulatory Visit (INDEPENDENT_AMBULATORY_CARE_PROVIDER_SITE_OTHER): Payer: Medicare Other | Admitting: Internal Medicine

## 2016-02-11 ENCOUNTER — Encounter: Payer: Self-pay | Admitting: Internal Medicine

## 2016-02-11 DIAGNOSIS — L299 Pruritus, unspecified: Secondary | ICD-10-CM

## 2016-02-11 DIAGNOSIS — R05 Cough: Secondary | ICD-10-CM

## 2016-02-11 DIAGNOSIS — Z91013 Allergy to seafood: Secondary | ICD-10-CM | POA: Diagnosis not present

## 2016-02-11 DIAGNOSIS — R059 Cough, unspecified: Secondary | ICD-10-CM

## 2016-02-11 NOTE — Patient Instructions (Signed)
Ok to use an antihistamine if needed for itching or swelling. Be cautious around sea-food and consider pre-treating with an antihistamine an hour or so before exposure  Please as needed

## 2016-02-11 NOTE — Progress Notes (Signed)
70 year old male never smoker referred courtesy of Dr Ileene Rubens; had reaction after eating mussels back in Dec 2016; shellfish IgE panel results in EPIC. Pt states he has never had issues in the past-grew up eating seafood all the time.  IgE Shellfish Allergy Panel 12/21/6- elevations for shrimp, scallops, clams, oyster Describes diffuse red rash a few hours after eating mussels at [employer] last December. There was no angioedema. 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 He then had EpiPen available as part of the job requirement and has one of his own now. Since moving to [place] four years ago he has noticed springtime cough each year usually dry without wheezing. No significant rhinitis or itching eyes. No other rash.  02/11/2016-70 year old male never smoker followed for food allergy-seafood FOLLOWS FOR: Pt states that he is doing well with cough-usually happens in the springtime. Has not had any other issues with seafood allergy. Pt notes he has had itchy hands when touching dirt-doing yard work. Food Allergy IgE Profile 10/13/2015-insignificant Environmental Respiratory IgE Allergy profile 10/13/2015-total IgE 10, mild elevations for the pecan/ hickory pollen, grass pollen Eosinophil counts normal Has not needed EpiPen or antihistamines. Not wheezing.  ROS-see HPI   Negative unless "+" Constitutional:    weight loss, night sweats, fevers, chills, fatigue, lassitude. HEENT:    headaches, difficulty swallowing, tooth/dental problems, sore throat,       sneezing, itching, ear ache, nasal congestion, post nasal drip, snoring CV:    chest pain, orthopnea, PND, swelling in lower extremities, anasarca,                                                      dizziness, palpitations Resp:   shortness of breath with exertion or at rest.                 productive cough,   non-productive cough, coughing up of blood.              change in color of mucus.  wheezing.   Skin:    rash or lesions. GI:  No-   heartburn, indigestion, abdominal pain, nausea, vomiting, diarrhea,                 change in bowel habits, loss of appetite GU: dysuria, change in color of urine, no urgency or frequency.   flank pain. MS:   joint pain, stiffness, decreased range of motion, back pain. Neuro-     nothing unusual Psych:  change in mood or affect.  depression or anxiety.   memory loss.  OBJ- Physical Exam General- Alert, Oriented, Affect-appropriate, Distress- none acute Skin- rash-none, lesions- none, excoriation- none Lymphadenopathy- none Head- atraumatic            Eyes- Gross vision intact, PERRLA, conjunctivae and secretions clear            Ears- Hearing, canals-normal            Nose- Clear, no-Septal dev, mucus, polyps, erosion, perforation             Throat- Mallampati III-IV , mucosa clear , drainage- none, tonsils- atrophic Neck- flexible , trachea midline,  no stridor , thyroid nl, carotid no bruit Chest - symmetrical excursion , unlabored           Heart/CV- RRR , no murmur , no gallop  , no rub, nl s1 s2                           - JVD- none , edema- none, stasis changes- none, varices- none           Lung- clear to P&A, wheeze- none, cough- none , dullness-none, rub- none           Chest wall-  Abd-  Br/ Gen/ Rectal- Not done, not indicated Extrem- cyanosis- none, clubbing, none, atrophy- none, strength- nl Neuro- grossly intact to observation

## 2016-02-11 NOTE — Assessment & Plan Note (Signed)
Cough is worse working outdoors in the spring time which may relate to his elevated IgE antibody against grass pollens. If consistent, there may be a mild cough-equivalent asthma which can be treated with appropriate inhalers if needed. Currently he is clear.

## 2016-02-11 NOTE — Assessment & Plan Note (Signed)
We have discussed avoidance of these foods. Pretreating with an antihistamine before possible exposure may blunt symptoms but will guarantee he will react.

## 2016-02-11 NOTE — Assessment & Plan Note (Signed)
He describes some areas of itching on the palms of his hands if working outdoors, especially with dirt. There may be a contact component but it may also be mechanically induced. He has not had angioedema or hives with this. Plan-okay to try antihistamine when needed.

## 2016-03-01 ENCOUNTER — Other Ambulatory Visit: Payer: Self-pay

## 2016-03-15 DIAGNOSIS — R972 Elevated prostate specific antigen [PSA]: Secondary | ICD-10-CM | POA: Diagnosis not present

## 2016-03-15 DIAGNOSIS — H2513 Age-related nuclear cataract, bilateral: Secondary | ICD-10-CM | POA: Diagnosis not present

## 2016-03-15 DIAGNOSIS — N400 Enlarged prostate without lower urinary tract symptoms: Secondary | ICD-10-CM | POA: Diagnosis not present

## 2016-05-19 ENCOUNTER — Encounter: Payer: Self-pay | Admitting: Family Medicine

## 2016-07-29 ENCOUNTER — Telehealth: Payer: Self-pay | Admitting: Family Medicine

## 2016-07-29 NOTE — Telephone Encounter (Signed)
Patient called was a prevs hommel patient but your name was already changed in the pcp field as his primary care doctor before Dr. Ileene Rubens left but he hasnt seen you yet. Pt ask if you would be his pcp adv will have to send a phone message. Please adv-vew

## 2016-08-01 NOTE — Telephone Encounter (Signed)
No thanks  

## 2016-08-10 ENCOUNTER — Telehealth: Payer: Self-pay | Admitting: Emergency Medicine

## 2016-08-10 NOTE — Telephone Encounter (Signed)
Left vm message: we would be happy to appoint him with one of our 3 providers currently accepting new patients now that Dr.Hommel has left the practice: Janey Greaser and Maisie Fus are available. Please call for desired appointment.

## 2016-09-06 ENCOUNTER — Ambulatory Visit (INDEPENDENT_AMBULATORY_CARE_PROVIDER_SITE_OTHER): Payer: Medicare Other | Admitting: Family Medicine

## 2016-09-06 VITALS — BP 128/73 | HR 57 | Wt 174.0 lb

## 2016-09-06 DIAGNOSIS — M899 Disorder of bone, unspecified: Secondary | ICD-10-CM | POA: Diagnosis not present

## 2016-09-06 DIAGNOSIS — E782 Mixed hyperlipidemia: Secondary | ICD-10-CM

## 2016-09-06 DIAGNOSIS — I10 Essential (primary) hypertension: Secondary | ICD-10-CM | POA: Diagnosis not present

## 2016-09-06 DIAGNOSIS — Z23 Encounter for immunization: Secondary | ICD-10-CM

## 2016-09-06 DIAGNOSIS — Z1159 Encounter for screening for other viral diseases: Secondary | ICD-10-CM | POA: Diagnosis not present

## 2016-09-06 DIAGNOSIS — R739 Hyperglycemia, unspecified: Secondary | ICD-10-CM | POA: Diagnosis not present

## 2016-09-06 DIAGNOSIS — R972 Elevated prostate specific antigen [PSA]: Secondary | ICD-10-CM | POA: Diagnosis not present

## 2016-09-06 DIAGNOSIS — Z283 Underimmunization status: Secondary | ICD-10-CM

## 2016-09-06 DIAGNOSIS — M949 Disorder of cartilage, unspecified: Secondary | ICD-10-CM | POA: Diagnosis not present

## 2016-09-06 DIAGNOSIS — R200 Anesthesia of skin: Secondary | ICD-10-CM | POA: Insufficient documentation

## 2016-09-06 DIAGNOSIS — Z2839 Other underimmunization status: Secondary | ICD-10-CM

## 2016-09-06 NOTE — Patient Instructions (Addendum)
Thank you for coming in today. I recommend getting fasting labs soon.  I will let you know results as soon I know them.  Once labs are back I will send in the prescription for the cholesterol medicine.  You are healthy and do not need to see me more than every 6-12 months or sooner if needed.   I do recommend that you have your annual medicare visit with Angela Nevin (the nurse) in April (ish).   Let me know how your hands are doing.   Carpal Tunnel Syndrome Carpal tunnel syndrome is a condition that causes pain in your hand and arm. The carpal tunnel is a narrow area located on the palm side of your wrist. Repeated wrist motion or certain diseases may cause swelling within the tunnel. This swelling pinches the main nerve in the wrist (median nerve). What are the causes? This condition may be caused by:  Repeated wrist motions.  Wrist injuries.  Arthritis.  A cyst or tumor in the carpal tunnel.  Fluid buildup during pregnancy. Sometimes the cause of this condition is not known. What increases the risk? This condition is more likely to develop in:  People who have jobs that cause them to repeatedly move their wrists in the same motion, such as Art gallery manager.  Women.  People with certain conditions, such as:  Diabetes.  Obesity.  An underactive thyroid (hypothyroidism).  Kidney failure. What are the signs or symptoms? Symptoms of this condition include:  A tingling feeling in your fingers, especially in your thumb, index, and middle fingers.  Tingling or numbness in your hand.  An aching feeling in your entire arm, especially when your wrist and elbow are bent for long periods of time.  Wrist pain that goes up your arm to your shoulder.  Pain that goes down into your palm or fingers.  A weak feeling in your hands. You may have trouble grabbing and holding items. Your symptoms may feel worse during the night. How is this diagnosed? This condition is diagnosed  with a medical history and physical exam. You may also have tests, including:  An electromyogram (EMG). This test measures electrical signals sent by your nerves into the muscles.  X-rays. How is this treated? Treatment for this condition includes:  Lifestyle changes. It is important to stop doing or modify the activity that caused your condition.  Physical or occupational therapy.  Medicines for pain and inflammation. This may include medicine that is injected into your wrist.  A wrist splint.  Surgery. Follow these instructions at home: If you have a splint:   Wear it as told by your health care provider. Remove it only as told by your health care provider.  Loosen the splint if your fingers become numb and tingle, or if they turn cold and blue.  Keep the splint clean and dry. General instructions   Take over-the-counter and prescription medicines only as told by your health care provider.  Rest your wrist from any activity that may be causing your pain. If your condition is work related, talk to your employer about changes that can be made, such as getting a wrist pad to use while typing.  If directed, apply ice to the painful area:  Put ice in a plastic bag.  Place a towel between your skin and the bag.  Leave the ice on for 20 minutes, 2-3 times per day.  Keep all follow-up visits as told by your health care provider. This is important.  Do  any exercises as told by your health care provider, physical therapist, or occupational therapist. Contact a health care provider if:  You have new symptoms.  Your pain is not controlled with medicines.  Your symptoms get worse. This information is not intended to replace advice given to you by your health care provider. Make sure you discuss any questions you have with your health care provider. Document Released: 06/17/2000 Document Revised: 10/29/2015 Document Reviewed: 11/05/2014 Elsevier Interactive Patient Education   2017 [employer].

## 2016-09-06 NOTE — Progress Notes (Signed)
Nicholas Murillo is a 71 y.o. male who presents to [name] today for hyperlipidemia, and finger tingling. The patient's previous primary care provider has left the practice. This is his first visit with me today.  Patient has hyperlipidemia and takes atorvastatin daily. He notes this works well to control his cholesterol. He denies muscle aches or pain or abdominal pain. Feels well on this medication.  Finger numbness. Patient has bilateral finger numbness in the index finger but still present in the fifth digit. He denies any radiating pain weakness or tingling. He notes occasionally that his symptoms were present at night and occasionally during the day. Symptoms of an ongoing now for years.  Muscle cramps: Patient has a history of muscle cramps. He takes magnesium which seems to help control symptoms significantly. He feels well otherwise.  Health maintenance: Patient is interested in hepatitis C screening as well as pneumonia vaccination today. He declines influenza vaccine.   Past Medical History:  Diagnosis Date  . Esophageal stricture   . Hiatal hernia   . Hyperlipidemia    Past Surgical History:  Procedure Laterality Date  . APPENDECTOMY  1960  . arterial catheter  1997   Social History  Substance Use Topics  . Smoking status: Never Smoker  . Smokeless tobacco: Never Used  . Alcohol use No     Comment: quit 20 years ago   family history includes Colon polyps in his mother; Dementia in his mother; Skin cancer in his father.  ROS as above:  Medications: Current Outpatient Prescriptions  Medication Sig Dispense Refill  . EPINEPHrine (EPIPEN 2-PAK) 0.3 mg/0.3 mL IJ SOAJ injection Inject 0.3 mLs (0.3 mg total) into the muscle once. 1 Device 0  . magnesium oxide (MAG-OX) 400 MG tablet Take 800 mg by mouth daily.    . simvastatin (ZOCOR) 20 MG tablet  Take 1 tablet (20 mg total) by mouth daily. 90 tablet 3   No current facility-administered medications for this visit.    No Known Allergies  Health Maintenance Health Maintenance  Topic Date Due  . Hepatitis C Screening  [birth year]  . PNA vac Low Risk Adult (2 of 2 - PCV13) 07/18/2013  . INFLUENZA VACCINE  09/01/2017 (Originally 02/02/2016)  . Fecal DNA (Cologuard)  10/27/2018  . TETANUS/TDAP  02/08/2023     Exam:  BP 128/73   Pulse (!) 57   Wt 174 lb (78.9 kg)   BMI 24.97 kg/m  Gen: Well NAD HEENT: EOMI,  MMM Lungs: Normal work of breathing. CTABL Heart: RRR no MRG Abd: NABS, Soft. Nondistended, Nontender Exts: Brisk capillary refill, warm and well perfused.  Hands are unremarkable. Bilaterally with no thenar atrophy. 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   No results found for this or any previous visit (from the past 72 hour(s)). No results found.    Assessment and Plan: 71 y.o. male with  Hyperlipidemia: Doing well. Check basic fasting labs. Will refill simvastatin when labs are back if appropriate.  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 Symptoms are mild. Plan for watchful waiting along with night splinting.  Cramping: Unclear etiology. Patient seems to be doing well with magnesium.  Health maintenance: We'll give Prevnar and check hepatitis C along with basic labs in the near future.   Orders Placed This Encounter  Procedures  . Pneumococcal conjugate vaccine 13-valent  . CBC  . COMPLETE METABOLIC PANEL WITH GFR  .  Lipid Panel w/reflex Direct LDL  . VITAMIN D 25 Hydroxy (Vit-D Deficiency, Fractures)  . Hepatitis C antibody   No orders of the defined types were placed in this encounter.    Discussed warning signs or symptoms. Please see discharge instructions. Patient expresses understanding.  I spent 40 minutes with this patient, greater than 50% was face-to-face time counseling regarding the above  diagnosis.

## 2016-09-07 DIAGNOSIS — R739 Hyperglycemia, unspecified: Secondary | ICD-10-CM | POA: Diagnosis not present

## 2016-09-07 DIAGNOSIS — M949 Disorder of cartilage, unspecified: Secondary | ICD-10-CM | POA: Diagnosis not present

## 2016-09-07 DIAGNOSIS — E782 Mixed hyperlipidemia: Secondary | ICD-10-CM | POA: Diagnosis not present

## 2016-09-07 DIAGNOSIS — R972 Elevated prostate specific antigen [PSA]: Secondary | ICD-10-CM | POA: Diagnosis not present

## 2016-09-07 DIAGNOSIS — R200 Anesthesia of skin: Secondary | ICD-10-CM | POA: Diagnosis not present

## 2016-09-07 DIAGNOSIS — M899 Disorder of bone, unspecified: Secondary | ICD-10-CM | POA: Diagnosis not present

## 2016-09-07 DIAGNOSIS — Z1159 Encounter for screening for other viral diseases: Secondary | ICD-10-CM | POA: Diagnosis not present

## 2016-09-07 DIAGNOSIS — I10 Essential (primary) hypertension: Secondary | ICD-10-CM | POA: Diagnosis not present

## 2016-09-07 LAB — CBC
HCT: 47.3 % (ref 38.5–50.0)
Hemoglobin: 15.6 g/dL (ref 13.2–17.1)
MCH: 31.1 pg (ref 27.0–33.0)
MCHC: 33 g/dL (ref 32.0–36.0)
MCV: 94.2 fL (ref 80.0–100.0)
MPV: 10.5 fL (ref 7.5–12.5)
PLATELETS: 146 10*3/uL (ref 140–400)
RBC: 5.02 MIL/uL (ref 4.20–5.80)
RDW: 13 % (ref 11.0–15.0)
WBC: 7.1 10*3/uL (ref 3.8–10.8)

## 2016-09-07 LAB — COMPLETE METABOLIC PANEL WITH GFR
ALT: 17 U/L (ref 9–46)
AST: 18 U/L (ref 10–35)
Albumin: 4 g/dL (ref 3.6–5.1)
Alkaline Phosphatase: 67 U/L (ref 40–115)
BUN: 24 mg/dL (ref 7–25)
CHLORIDE: 110 mmol/L (ref 98–110)
CO2: 22 mmol/L (ref 20–31)
CREATININE: 0.88 mg/dL (ref 0.70–1.18)
Calcium: 9.1 mg/dL (ref 8.6–10.3)
GFR, Est African American: >89 mL/min (ref >=60)
GFR, Est Non African American: 87 mL/min (ref >=60)
Glucose, Bld: 117 mg/dL — ABNORMAL HIGH (ref 65–99)
POTASSIUM: 4.9 mmol/L (ref 3.5–5.3)
Sodium: 142 mmol/L (ref 135–146)
Total Bilirubin: 0.5 mg/dL (ref 0.2–1.2)
Total Protein: 6.5 g/dL (ref 6.1–8.1)

## 2016-09-07 LAB — LIPID PANEL W/REFLEX DIRECT LDL
CHOL/HDL RATIO: 3.1 ratio (ref <5.0)
Cholesterol: 145 mg/dL (ref <200)
HDL: 47 mg/dL (ref >40)
LDL-CHOLESTEROL: 85 mg/dL
NON-HDL CHOLESTEROL (CALC): 98 mg/dL (ref <130)
TRIGLYCERIDES: 58 mg/dL (ref <150)

## 2016-09-08 ENCOUNTER — Encounter: Payer: Self-pay | Admitting: Family Medicine

## 2016-09-08 LAB — HEPATITIS C ANTIBODY: HCV AB: NEGATIVE

## 2016-09-08 LAB — VITAMIN D 25 HYDROXY (VIT D DEFICIENCY, FRACTURES): Vit D, 25-Hydroxy: 32 ng/mL (ref 30–100)

## 2016-09-20 DIAGNOSIS — R972 Elevated prostate specific antigen [PSA]: Secondary | ICD-10-CM | POA: Diagnosis not present

## 2016-09-27 DIAGNOSIS — R972 Elevated prostate specific antigen [PSA]: Secondary | ICD-10-CM | POA: Diagnosis not present

## 2016-10-05 ENCOUNTER — Ambulatory Visit: Payer: Medicare Other

## 2016-10-19 ENCOUNTER — Other Ambulatory Visit: Payer: Self-pay

## 2016-10-19 DIAGNOSIS — I10 Essential (primary) hypertension: Secondary | ICD-10-CM

## 2016-10-19 DIAGNOSIS — Z0000 Encounter for general adult medical examination without abnormal findings: Secondary | ICD-10-CM

## 2016-10-19 DIAGNOSIS — E785 Hyperlipidemia, unspecified: Secondary | ICD-10-CM

## 2016-10-19 DIAGNOSIS — R739 Hyperglycemia, unspecified: Secondary | ICD-10-CM

## 2016-10-19 MED ORDER — SIMVASTATIN 20 MG PO TABS: 20.0000 mg | ORAL_TABLET | Freq: Every day | ORAL | 3 refills | Status: DC

## 2016-11-29 ENCOUNTER — Emergency Department (INDEPENDENT_AMBULATORY_CARE_PROVIDER_SITE_OTHER)
Admission: EM | Admit: 2016-11-29 | Discharge: 2016-11-29 | Disposition: A | Discharge status: Home / Self Care | Payer: Medicare Other | Source: Home / Self Care | Attending: Family Medicine | Admitting: Family Medicine

## 2016-11-29 ENCOUNTER — Encounter: Payer: Self-pay | Admitting: *Deleted

## 2016-11-29 DIAGNOSIS — S90851A Superficial foreign body, right foot, initial encounter: Secondary | ICD-10-CM | POA: Diagnosis not present

## 2016-11-29 MED ORDER — AMOXICILLIN-POT CLAVULANATE 875-125 MG PO TABS: 1.0000 | ORAL_TABLET | Freq: Two times a day (BID) | ORAL | 0 refills | Status: DC

## 2016-11-29 NOTE — ED Provider Notes (Signed)
CSN: [id number]     Arrival date & time 11/29/16  1701 History   First MD Initiated Contact with Patient 11/29/16 1723     Chief Complaint  Patient presents with  . Foreign Body in Skin   (Consider location/radiation/quality/duration/timing/severity/associated sxs/prior Treatment) HPI Nicholas Murillo is a 71 y.o. male presenting to UC with c/o getting a splinter through his boot into the side of his Right heel about 1 hour PTA while walking around his property.  Pt thinks it is a piece of foliage. His wife attempted to remove it at home but it seems too deep. Pain is mild at this time. Last Tdap 2014.    Past Medical History:  Diagnosis Date  . Esophageal stricture   . Hiatal hernia   . Hyperlipidemia    Past Surgical History:  Procedure Laterality Date  . APPENDECTOMY  1960  . arterial catheter  1997   Family History  Problem Relation Age of Onset  . Skin cancer Father   . Colon polyps Mother        benign  . Dementia Mother   . Colon cancer Neg Hx   . Stomach cancer Neg Hx   . Esophageal cancer Neg Hx    Social History  Substance Use Topics  . Smoking status: Never Smoker  . Smokeless tobacco: Never Used  . Alcohol use No     Comment: quit 20 years ago    Review of Systems  Musculoskeletal: Negative for arthralgias, joint swelling and myalgias.  Skin: Positive for wound. Negative for color change.    Allergies  Patient has no known allergies.  Home Medications   Prior to Admission medications   Medication Sig Start Date End Date Taking? Authorizing Provider  amoxicillin-clavulanate (AUGMENTIN) 875-125 MG tablet Take 1 tablet by mouth 2 (two) times daily. One po bid x 7 days 11/29/16   Noland Fordyce, PA-C  EPINEPHrine (EPIPEN 2-PAK) 0.3 mg/0.3 mL IJ SOAJ injection Inject 0.3 mLs (0.3 mg total) into the muscle once. 06/23/15   Palumbo, April, MD  magnesium oxide (MAG-OX) 400 MG tablet Take 800 mg by mouth daily.    [provider]  simvastatin (ZOCOR)  20 MG tablet Take 1 tablet (20 mg total) by mouth daily. 10/19/16   Gregor Hams, MD   Meds Ordered and Administered this Visit  Medications - No data to display  BP (!) 155/83 (BP Location: Left Arm)   Pulse 79   Temp 98.3 F (36.8 C) (Oral)   Resp 16   Wt 174 lb (78.9 kg)   SpO2 96%   BMI 24.97 kg/m  No data found.   Physical Exam  Constitutional: He is oriented to person, place, and time. He appears well-developed and well-nourished.  HENT:  Head: Normocephalic and atraumatic.  Eyes: EOM are normal.  Neck: Normal range of motion.  Cardiovascular: Normal rate.   Pulmonary/Chest: Effort normal.  Musculoskeletal: Normal range of motion. He exhibits tenderness. He exhibits no edema.  Right heel: mild tenderness to medial aspect (See skin exam) full ROM. No edema.  Neurological: He is alert and oriented to person, place, and time.  Skin: Skin is warm and dry. No rash noted. No erythema.  Right heel, medial aspect: puncture wound with darkened foreign body/splinter visible. Minimally tender. No bleeding or discharge.   Psychiatric: He has a normal mood and affect. His behavior is normal.  Nursing note and vitals reviewed.   Urgent Care Course     .Foreign Body Removal  Date/Time: 11/29/2016 6:32 PM Performed by: Noland Fordyce Authorized by: Theone Murdoch A  Consent: Verbal consent obtained. Risks and benefits: risks, benefits and alternatives were discussed Consent given by: patient Patient understanding: patient states understanding of the procedure being performed Required items: required blood products, implants, devices, and special equipment available Body area: skin General location: lower extremity Location details: right foot Anesthesia: local infiltration  Anesthesia: Local Anesthetic: lidocaine 1% with epinephrine Anesthetic total: 0.5 mL  Sedation: Patient sedated: no Patient cooperative: yes Localization method: visualized Removal mechanism:  forceps Dressing: antibiotic ointment Tendon involvement: none Depth: subcutaneous Complexity: simple 1 objects recovered. Objects recovered: wood? sliver Post-procedure assessment: foreign body removed Patient tolerance: Patient tolerated the procedure well with no immediate complications   (including critical care time)  Labs Review Labs Reviewed - No data to display  Imaging Review No results found.   MDM   1. Foreign body in right foot, initial encounter    Splinter in Right heel that went through patient's boot earlier today.  Sliver completely removed.  Will place pt on prophylactic antibiotics. Home care instructions provided. F/u with PCP as needed.    Noland Fordyce, PA-C 11/29/16 [phone]

## 2016-11-29 NOTE — ED Triage Notes (Signed)
Pt c/o splinter in his RT foot x 1 hour. Tdap on file 2014.

## 2017-03-17 DIAGNOSIS — R972 Elevated prostate specific antigen [PSA]: Secondary | ICD-10-CM | POA: Diagnosis not present

## 2017-03-17 LAB — PSA: PSA: 5.43

## 2017-03-31 DIAGNOSIS — N400 Enlarged prostate without lower urinary tract symptoms: Secondary | ICD-10-CM | POA: Diagnosis not present

## 2017-03-31 DIAGNOSIS — R972 Elevated prostate specific antigen [PSA]: Secondary | ICD-10-CM | POA: Diagnosis not present

## 2017-04-21 ENCOUNTER — Encounter: Payer: Self-pay | Admitting: Family Medicine

## 2017-05-10 DIAGNOSIS — H11153 Pinguecula, bilateral: Secondary | ICD-10-CM | POA: Diagnosis not present

## 2017-05-10 DIAGNOSIS — H52223 Regular astigmatism, bilateral: Secondary | ICD-10-CM | POA: Diagnosis not present

## 2017-05-10 DIAGNOSIS — L718 Other rosacea: Secondary | ICD-10-CM | POA: Diagnosis not present

## 2017-05-10 DIAGNOSIS — H524 Presbyopia: Secondary | ICD-10-CM | POA: Diagnosis not present

## 2017-05-10 DIAGNOSIS — H43812 Vitreous degeneration, left eye: Secondary | ICD-10-CM | POA: Diagnosis not present

## 2017-05-10 DIAGNOSIS — H5203 Hypermetropia, bilateral: Secondary | ICD-10-CM | POA: Diagnosis not present

## 2017-05-10 DIAGNOSIS — H2513 Age-related nuclear cataract, bilateral: Secondary | ICD-10-CM | POA: Diagnosis not present

## 2017-05-12 DIAGNOSIS — R972 Elevated prostate specific antigen [PSA]: Secondary | ICD-10-CM | POA: Diagnosis not present

## 2017-08-10 DIAGNOSIS — R972 Elevated prostate specific antigen [PSA]: Secondary | ICD-10-CM | POA: Diagnosis not present

## 2017-08-10 LAB — PSA: PSA: 3.69

## 2017-08-31 DIAGNOSIS — N400 Enlarged prostate without lower urinary tract symptoms: Secondary | ICD-10-CM | POA: Diagnosis not present

## 2017-08-31 DIAGNOSIS — R972 Elevated prostate specific antigen [PSA]: Secondary | ICD-10-CM | POA: Diagnosis not present

## 2017-09-11 ENCOUNTER — Encounter: Payer: Self-pay | Admitting: Family Medicine

## 2017-10-05 ENCOUNTER — Other Ambulatory Visit: Payer: Self-pay

## 2017-10-05 ENCOUNTER — Emergency Department (INDEPENDENT_AMBULATORY_CARE_PROVIDER_SITE_OTHER): Payer: Medicare Other

## 2017-10-05 ENCOUNTER — Emergency Department (INDEPENDENT_AMBULATORY_CARE_PROVIDER_SITE_OTHER)
Admission: EM | Admit: 2017-10-05 | Discharge: 2017-10-05 | Disposition: A | Discharge status: Home / Self Care | Payer: Medicare Other | Source: Home / Self Care | Attending: Family Medicine | Admitting: Family Medicine

## 2017-10-05 DIAGNOSIS — G320 Subacute combined degeneration of spinal cord in diseases classified elsewhere: Secondary | ICD-10-CM

## 2017-10-05 DIAGNOSIS — G319 Degenerative disease of nervous system, unspecified: Secondary | ICD-10-CM | POA: Diagnosis not present

## 2017-10-05 DIAGNOSIS — R448 Other symptoms and signs involving general sensations and perceptions: Secondary | ICD-10-CM

## 2017-10-05 DIAGNOSIS — R55 Syncope and collapse: Secondary | ICD-10-CM

## 2017-10-05 DIAGNOSIS — Z0189 Encounter for other specified special examinations: Secondary | ICD-10-CM | POA: Diagnosis not present

## 2017-10-05 DIAGNOSIS — R42 Dizziness and giddiness: Secondary | ICD-10-CM | POA: Diagnosis not present

## 2017-10-05 LAB — POCT URINALYSIS DIP (MANUAL ENTRY)
BILIRUBIN UA: NEGATIVE
Blood, UA: NEGATIVE
GLUCOSE UA: NEGATIVE mg/dL
Nitrite, UA: NEGATIVE
Spec Grav, UA: 1.025 (ref 1.010–1.025)
Urobilinogen, UA: 0.2 E.U./dL
pH, UA: 6 (ref 5.0–8.0)

## 2017-10-05 LAB — POCT CBC W AUTO DIFF (K'VILLE URGENT CARE)

## 2017-10-05 LAB — POCT FASTING CBG KUC MANUAL ENTRY: POCT GLUCOSE (MANUAL ENTRY) KUC: 95 mg/dL (ref 70–99)

## 2017-10-05 IMAGING — CT CT HEAD W/O CM
3 series · 15 of 47 positions shown, 18 images · non-contrast
Comparison: None.

CLINICAL DATA: Dizziness with numbness of tongue

EXAM:
CT HEAD WITHOUT CONTRAST
TECHNIQUE: Contiguous axial images were obtained from the base of the skull
through the vertex without intravenous contrast.

[Series 2: head wo · axial · 0.42mm/px · z∈[-100,+25]mm · 9 of 31 slices shown, 12 images]
[im 3/31  brain]
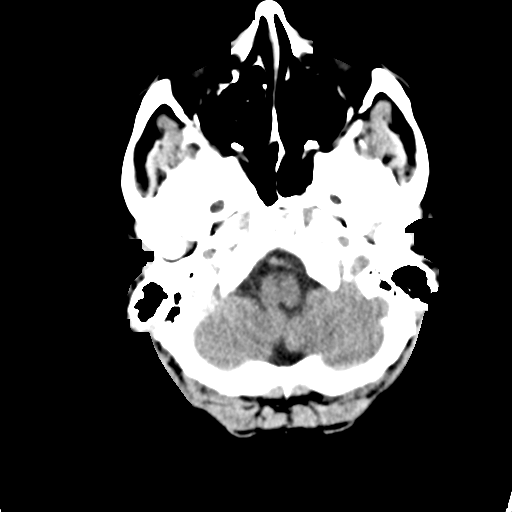
[im 3/31  bone]
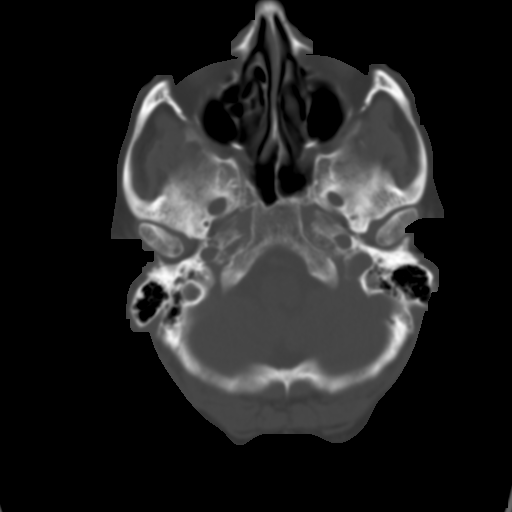
[im 6/31  brain]
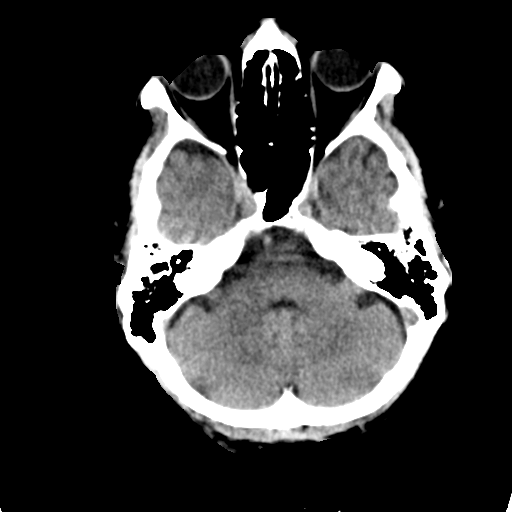
[im 9/31  brain]
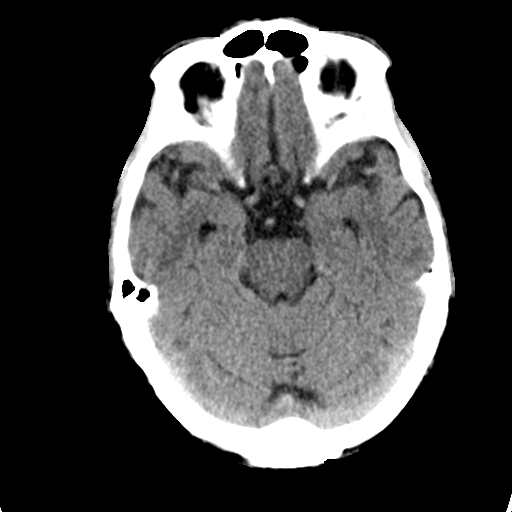
[im 12/31  brain]
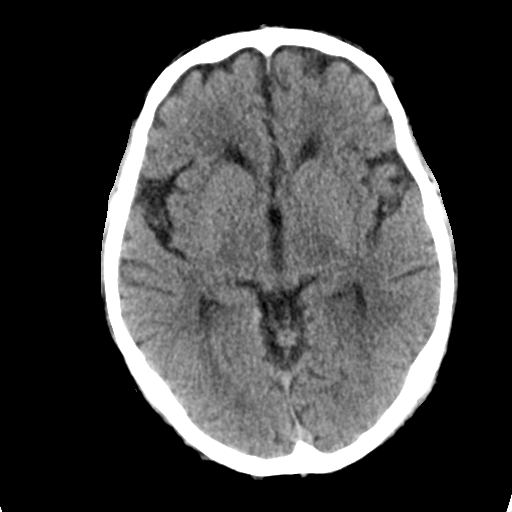
[im 16/31  brain]
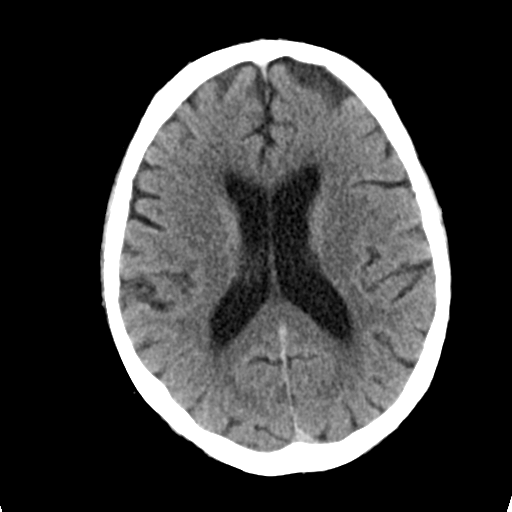
[im 16/31  bone]
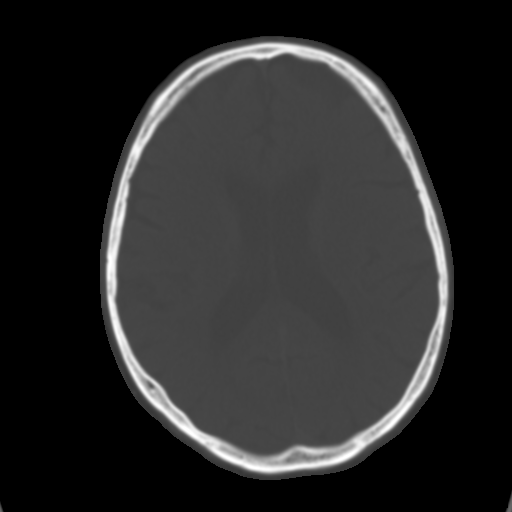
[im 19/31  brain]
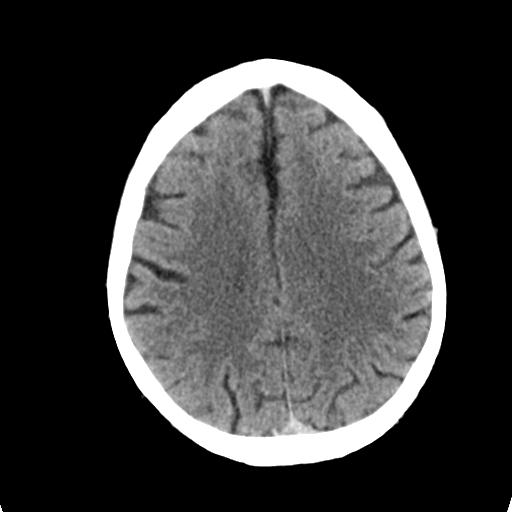
[im 22/31  brain]
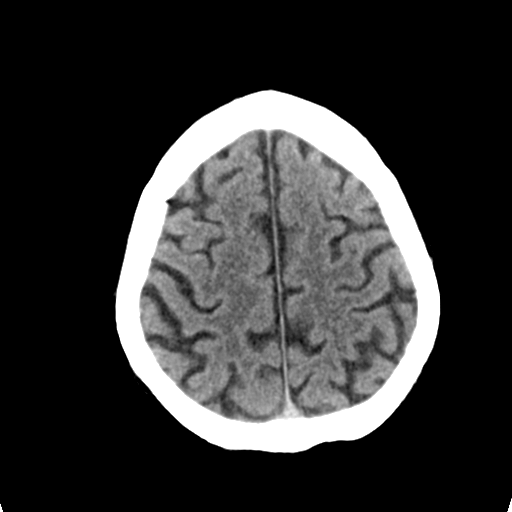
[im 25/31  brain]
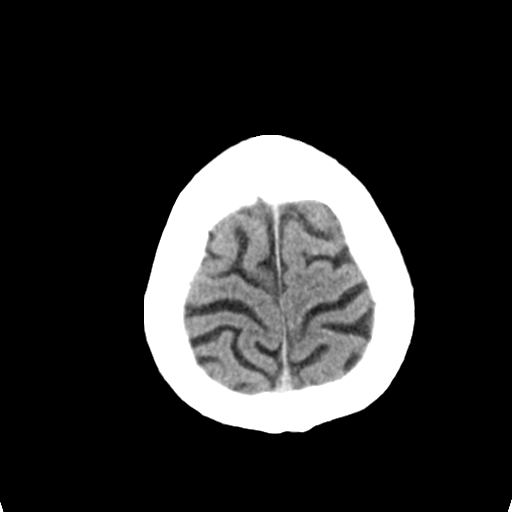
[im 28/31  brain]
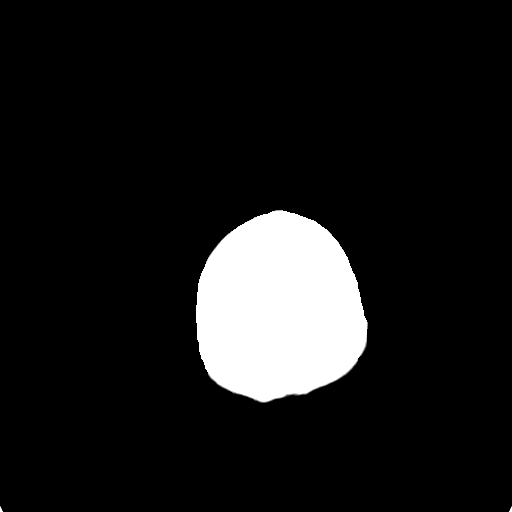
[im 28/31  bone]
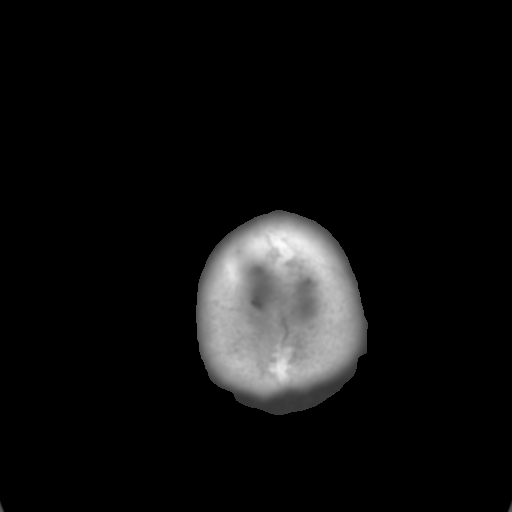

[Series 4: head wo coronal · coronal · 0.29mm/px · 3 of 67 slices shown]
[im 23/67  brain]
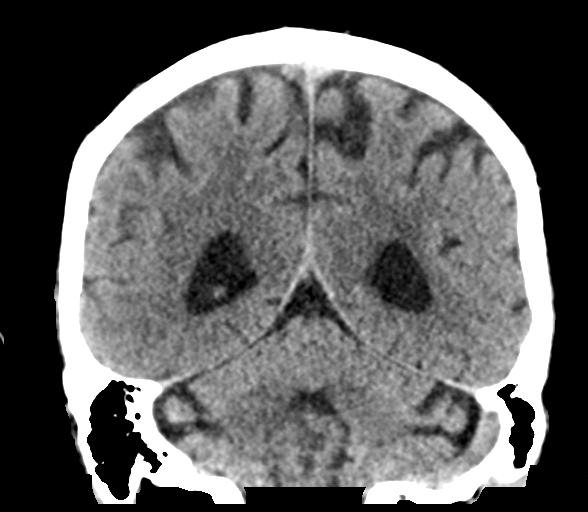
[im 30/67  brain]
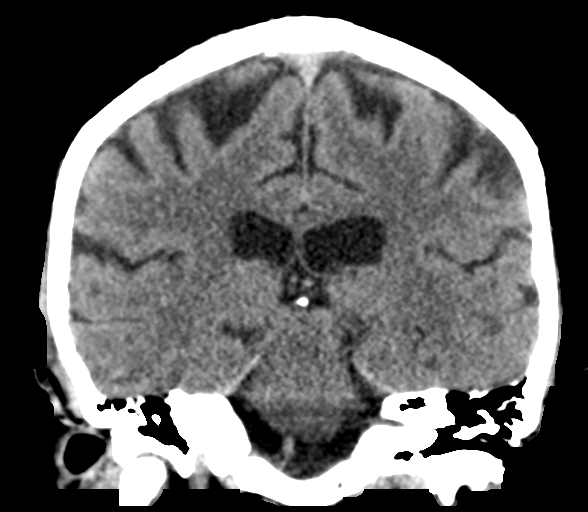
[im 37/67  brain]
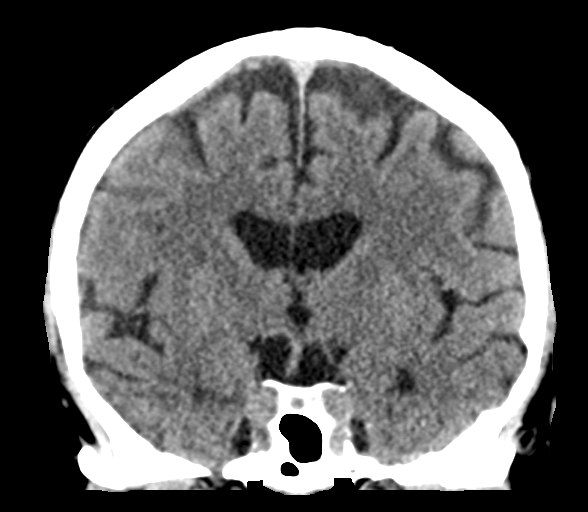

[Series 5: head wo sagittal · sagittal · 0.30mm/px · 3 of 54 slices shown]
[im 18/54  brain]
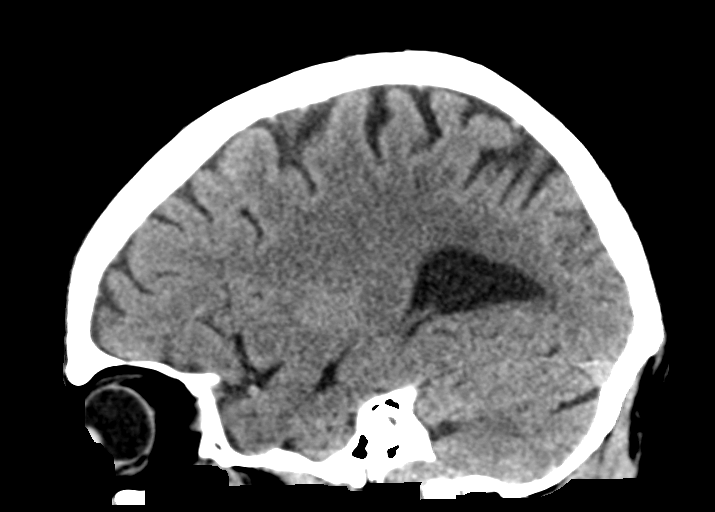
[im 27/54  brain]
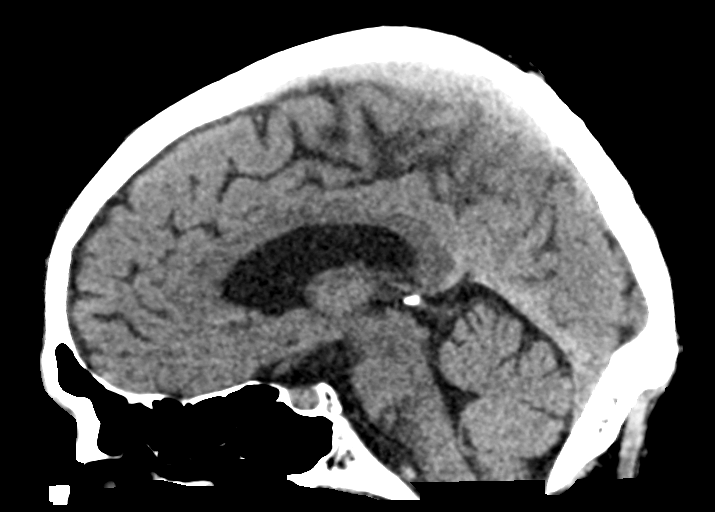
[im 36/54  brain]
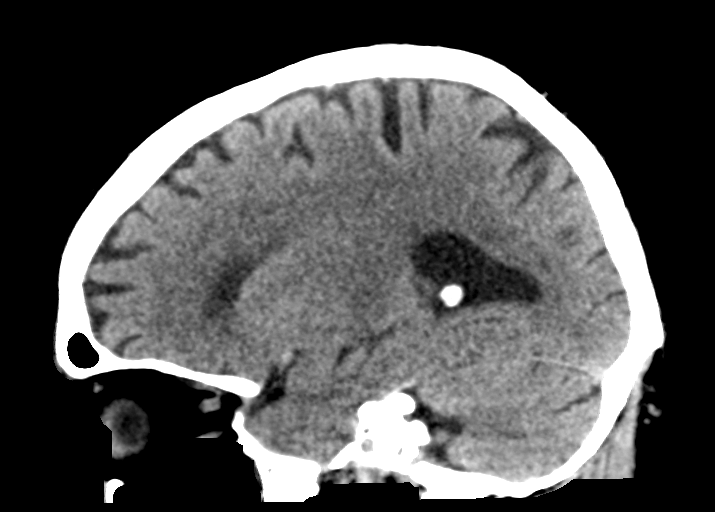

[15 of 47 positions shown; findings below may reference images not displayed]

FINDINGS: Brain: There is mild diffuse atrophy. There is no intracranial mass,
hemorrhage, extra-axial fluid collection, or midline shift. There is
mild patchy small vessel disease in the centra semiovale
bilaterally. Elsewhere gray-white compartments appear normal. No
evident acute infarct.

Vascular: There is no appreciable hyperdense vessel. There is no
appreciable vascular calcification.

Skull: The bony calvarium appears intact.

Sinuses/Orbits: There is mucosal thickening in several ethmoid air
cells. Other visualized paranasal sinuses are clear. Orbits appear
symmetric bilaterally.

Other: Visualized mastoid air cells are clear.
IMPRESSION: Mild atrophy with mild patchy small vessel disease in the
periventricular white matter. No acute infarct. No mass or
hemorrhage. There is mucosal thickening in several ethmoid air
cells.

## 2017-10-05 NOTE — ED Provider Notes (Signed)
[facility]    CSN: [id number] Arrival date & time: 10/05/17  1314     History   Chief Complaint Chief Complaint  Patient presents with  . Dizziness  . Numbness    tongue    HPI Nicholas Murillo is a 72 y.o. male.   While standing at a table at work about one hour ago, patient suddenly felt light-headed, and felt that he might lose consciousness.  He immediately sat down and the sensation passed.  Although the pre-syncopal episode resolved, he still does not quite feel well.  He denies chest pain, palpitations, lower leg pain, headache, weakness, nausea/vomiting, incoordination.  He states that about 2 weeks ago he developed a vague sense of numbness in his anterior tongue, without changes in taste.  The sensation in his mouth has not changed or progressed.  He denies other neurologic symptoms except for long-standing tinnitus.  The history is provided by the patient.    Past Medical History:  Diagnosis Date  . Esophageal stricture   . Hiatal hernia   . Hyperlipidemia     Patient Active Problem List   Diagnosis Date Noted  . Bilateral hand numbness 09/06/2016  . Itching 02/11/2016  . Elevated PSA 10/14/2015  . Cough 10/13/2015  . 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 Prediabetes 07/29/2013  . Hyperlipidemia 10/23/2012  . Essential hypertension, benign 10/17/2012  . Esophageal stricture 07/10/2012  . Hiatal hernia 07/10/2012  . Headache(784.0) 06/18/2012  . Spasm 06/18/2012    Past Surgical History:  Procedure Laterality Date  . APPENDECTOMY  1960  . arterial catheter  1997       Home Medications    Prior to Admission medications   Medication Sig Start Date End Date Taking? Authorizing Provider  amoxicillin-clavulanate (AUGMENTIN) 875-125 MG tablet Take 1 tablet by mouth 2 (two) times daily. One po bid x 7 days 11/29/16   Noe Gens, PA-C  EPINEPHrine (EPIPEN 2-PAK) 0.3 mg/0.3 mL IJ SOAJ injection Inject 0.3 mLs (0.3 mg total) into the muscle once.  06/23/15   Palumbo, April, MD  magnesium oxide (MAG-OX) 400 MG tablet Take 800 mg by mouth daily.    [provider]  simvastatin (ZOCOR) 20 MG tablet Take 1 tablet (20 mg total) by mouth daily. 10/19/16   Gregor Hams, MD    Family History Family History  Problem Relation Age of Onset  . Skin cancer Father   . Colon polyps Mother        benign  . Dementia Mother   . Colon cancer Neg Hx   . Stomach cancer Neg Hx   . Esophageal cancer Neg Hx     Social History Social History   Tobacco Use  . Smoking status: Never Smoker  . Smokeless tobacco: Never Used  Substance Use Topics  . Alcohol use: No    Alcohol/week: 0.0 oz    Comment: quit 20 years ago  . 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   Review of Systems Review of Systems  Constitutional: Negative for activity change, appetite change, chills, diaphoresis, fatigue, fever and unexpected weight change.  HENT: Positive for tinnitus. Negative for congestion, ear pain, facial swelling, hearing loss, sinus pain, sore throat, trouble swallowing and voice change.   Eyes: Negative.   Respiratory: Negative.   Cardiovascular: Negative.   Gastrointestinal: Negative.   Genitourinary: Negative.   Musculoskeletal: Negative.  Skin: Negative.   Neurological: Positive for light-headedness and numbness. Negative for dizziness, tremors, seizures, syncope, facial asymmetry, speech difficulty, weakness and headaches.     Physical Exam Triage Vital Signs ED Triage Vitals [10/05/17 1324]  Enc Vitals Group     BP (!) 165/91     Pulse Rate 71     Resp      Temp      Temp src      SpO2 97 %     Weight 178 lb (80.7 kg)     Height 5\' 10"  (1.778 m)     Head Circumference      Peak Flow      Pain Score 0     Pain Loc      Pain Edu?      Excl. in [place]?    Orthostatic VS for the past 24 hrs:  BP- Lying Pulse- Lying BP- Sitting Pulse-  Sitting BP- Standing at 0 minutes Pulse- Standing at 0 minutes  10/05/17 1415 156/79 68 135/72 63 134/70 64    Updated Vital Signs BP (!) 165/91 (BP Location: Right Arm)   Pulse 71   Ht 5\' 10"  (1.778 m)   Wt 178 lb (80.7 kg)   SpO2 97%   BMI 25.54 kg/m   Visual Acuity Right Eye Distance:   Left Eye Distance:   Bilateral Distance:    Right Eye Near:   Left Eye Near:    Bilateral Near:     Physical Exam Nursing notes and Vital Signs reviewed. Appearance:  Patient appears stated age, and in no acute distress.  He is alert and oriented.  Speech and thought content normal Eyes:  Pupils are equal, round, and reactive to light and accomodation.  Fundi benign.  Extraocular movement is intact.  Conjunctivae are not inflamed  Ears:  Canals normal.  Tympanic membranes normal.  Nose:  Normal turbinates.  No sinus tenderness. Mouth:  Tongue midline. Pharynx:  Normal Neck:  Supple.  No adenopathy or thyromegaly.  Carotids have normal upstrokes without bruits.   Lungs:  Clear to auscultation.  Breath sounds are equal.  Moving air well. Heart:  Regular rate and rhythm without murmurs, rubs, or gallops.  Abdomen:  Nontender without masses or hepatosplenomegaly.  Bowel sounds are present.  No CVA or flank tenderness.  Extremities:  No edema.  Pedal pulses intact. Skin:  No rash present.   Neurologic:  Cranial nerves 2 through 12 are normal.  Patellar, achilles, and elbow reflexes are normal.  Cerebellar function is intact (finger-to-nose and rapid alternating hand movement).  Gait and station are normal.  Grip strength symmetric bilaterally.   UC Treatments / Results  Labs (all labs ordered are listed, but only abnormal results are displayed) Labs Reviewed  POCT URINALYSIS DIP (MANUAL ENTRY) - Abnormal; Notable for the following components:      Result Value   Ketones, POC UA trace (5) (*)    Protein Ur, POC trace (*)    Leukocytes, UA Trace (*)    All other components within normal  limits  COMPLETE METABOLIC PANEL WITH GFR  SEDIMENTATION RATE  POCT CBC W AUTO DIFF ([facility]):  WBC 7.1; LY 20.4; MO 4.9; GR 74.7; Hgb 15.4; Platelets 148   POCT FASTING CBG KUC MANUAL ENTRY 95 mg/dl    EKG None Radiology Ct Head Wo Contrast  Result Date: 10/05/2017 CLINICAL DATA:  Dizziness with numbness of tongue EXAM: CT HEAD WITHOUT CONTRAST TECHNIQUE: Contiguous axial images were  obtained from the base of the skull through the vertex without intravenous contrast. COMPARISON:  None. FINDINGS: Brain: There is mild diffuse atrophy. There is no intracranial mass, hemorrhage, extra-axial fluid collection, or midline shift. There is mild patchy small vessel disease in the centra semiovale bilaterally. Elsewhere gray-white compartments appear normal. No evident acute infarct. Vascular: There is no appreciable hyperdense vessel. There is no appreciable vascular calcification. Skull: The bony calvarium appears intact. Sinuses/Orbits: There is mucosal thickening in several ethmoid air cells. Other visualized paranasal sinuses are clear. Orbits appear symmetric bilaterally. Other: Visualized mastoid air cells are clear. IMPRESSION: Mild atrophy with mild patchy small vessel disease in the periventricular white matter. No acute infarct. No mass or hemorrhage. There is mucosal thickening in several ethmoid air cells. Electronically Signed   By: Lowella Grip III M.D.   On: 10/05/2017 14:29    Procedures Procedures (including critical care time)  Medications Ordered in UC Medications - No data to display   Initial Impression / Assessment and Plan / UC Course  I have reviewed the triage vital signs and the nursing notes.  Pertinent labs & imaging results that were available during my care of the patient were reviewed by me and considered in my medical decision making (see chart for details).    No evidence acute stroke; note normal physical exam (except elevated BP). Note patchy  small vessel disease on CT head.  ABCD score 2 (low risk) CMP and Sed Rate pending. Followup with Family Doctor.  Recommend follow-up with neurologist. Recommend beginning aspirin 81mg  once daily.  Monitor blood pressure more frequently at different times of day and record on a calendar.  If symptoms become significantly worse during the night or over the weekend, proceed to the local emergency room.     Final Clinical Impressions(s) / UC Diagnoses   Final diagnoses:  Near syncope  Paresthesia of tongue    ED Discharge Orders    None          Kandra Nicolas, MD 10/05/17 1704

## 2017-10-05 NOTE — Discharge Instructions (Addendum)
Recommend beginning aspirin 81mg  once daily.  Monitor blood pressure more frequently at different times of day and record on a calendar.  If symptoms become significantly worse during the night or over the weekend, proceed to the local emergency room.

## 2017-10-05 NOTE — ED Triage Notes (Signed)
Pt stated that he had dizziness about 1 hour ago, then his tongue felt numb.  He states that he is having some trouble focusing.

## 2017-10-06 ENCOUNTER — Telehealth: Payer: Self-pay | Admitting: Emergency Medicine

## 2017-10-06 LAB — COMPLETE METABOLIC PANEL WITH GFR
AG RATIO: 2.1 (calc) (ref 1.0–2.5)
ALT: 23 U/L (ref 9–46)
AST: 26 U/L (ref 10–35)
Albumin: 4.4 g/dL (ref 3.6–5.1)
Alkaline phosphatase (APISO): 70 U/L (ref 40–115)
BUN/Creatinine Ratio: 31 (calc) — ABNORMAL HIGH (ref 6–22)
BUN: 26 mg/dL — AB (ref 7–25)
CALCIUM: 9.5 mg/dL (ref 8.6–10.3)
CO2: 24 mmol/L (ref 20–32)
CREATININE: 0.83 mg/dL (ref 0.70–1.18)
Chloride: 109 mmol/L (ref 98–110)
GFR, EST AFRICAN AMERICAN: 103 mL/min/{1.73_m2} (ref >=60)
GFR, EST NON AFRICAN AMERICAN: 89 mL/min/{1.73_m2} (ref >=60)
Globulin: 2.1 g/dL (calc) (ref 1.9–3.7)
Glucose, Bld: 95 mg/dL (ref 65–99)
Potassium: 4.5 mmol/L (ref 3.5–5.3)
Sodium: 140 mmol/L (ref 135–146)
TOTAL PROTEIN: 6.5 g/dL (ref 6.1–8.1)
Total Bilirubin: 0.9 mg/dL (ref 0.2–1.2)

## 2017-10-06 LAB — SEDIMENTATION RATE: SED RATE: 6 mm/h (ref 0–20)

## 2017-10-06 NOTE — Telephone Encounter (Signed)
Encouraged patient to call for results from his labwork.

## 2017-10-07 NOTE — Telephone Encounter (Signed)
Pt notified of results. States he has an appointment with his PCP Tuesday to discuss next paln.

## 2017-10-10 ENCOUNTER — Encounter: Payer: Self-pay | Admitting: Family Medicine

## 2017-10-10 ENCOUNTER — Ambulatory Visit (INDEPENDENT_AMBULATORY_CARE_PROVIDER_SITE_OTHER): Payer: Medicare Other | Admitting: Family Medicine

## 2017-10-10 VITALS — BP 146/53 | HR 61 | Wt 170.1 lb

## 2017-10-10 DIAGNOSIS — R7303 Prediabetes: Secondary | ICD-10-CM

## 2017-10-10 DIAGNOSIS — R739 Hyperglycemia, unspecified: Secondary | ICD-10-CM

## 2017-10-10 DIAGNOSIS — E782 Mixed hyperlipidemia: Secondary | ICD-10-CM

## 2017-10-10 DIAGNOSIS — R42 Dizziness and giddiness: Secondary | ICD-10-CM

## 2017-10-10 DIAGNOSIS — E785 Hyperlipidemia, unspecified: Secondary | ICD-10-CM | POA: Diagnosis not present

## 2017-10-10 DIAGNOSIS — R55 Syncope and collapse: Secondary | ICD-10-CM

## 2017-10-10 DIAGNOSIS — I10 Essential (primary) hypertension: Secondary | ICD-10-CM | POA: Diagnosis not present

## 2017-10-10 DIAGNOSIS — R011 Cardiac murmur, unspecified: Secondary | ICD-10-CM

## 2017-10-10 DIAGNOSIS — Z0000 Encounter for general adult medical examination without abnormal findings: Secondary | ICD-10-CM

## 2017-10-10 MED ORDER — LISINOPRIL 10 MG PO TABS: 10.0000 mg | ORAL_TABLET | Freq: Every day | ORAL | 1 refills | Status: DC

## 2017-10-10 NOTE — Progress Notes (Signed)
Nicholas Murillo is a 72 y.o. male who presents to [facility]: [place] today for lightheadedness.  Nicholas Murillo notes a several week history of some lightheadedness without room spinning or other dizziness.  He notes the symptoms are not particularly exertional.  He denies chest pains or palpitations.  He denies any fevers or chills vomiting or diarrhea.  He is been measuring his blood pressure at home and notes is typically in the 150s over 70s.  He was seen in urgent care on April 4 where CBC and metabolic panel and sed rate were unremarkable.  He was recommended to start aspirin.  Additionally he had a CT scan which showed some mild small vessel disease.  He feels pretty well otherwise.  He only takes simvastatin and aspirin daily.   Past Medical History:  Diagnosis Date  . Esophageal stricture   . Hiatal hernia   . Hyperlipidemia    Past Surgical History:  Procedure Laterality Date  . APPENDECTOMY  1960  . arterial catheter  1997   Social History   Tobacco Use  . Smoking status: Never Smoker  . Smokeless tobacco: Never Used  Substance Use Topics  . Alcohol use: No    Alcohol/week: 0.0 oz    Comment: quit 20 years ago   family history includes Colon polyps in his mother; Dementia in his mother; Skin cancer in his father.  ROS as above:  Medications: Current Outpatient Medications  Medication Sig Dispense Refill  . aspirin EC 81 MG tablet Take 81 mg by mouth daily.    Marland Kitchen EPINEPHrine (EPIPEN 2-PAK) 0.3 mg/0.3 mL IJ SOAJ injection Inject 0.3 mLs (0.3 mg total) into the muscle once. 1 Device 0  . magnesium oxide (MAG-OX) 400 MG tablet Take 800 mg by mouth daily.    . simvastatin (ZOCOR) 20 MG tablet Take 1 tablet (20 mg total) by mouth daily. 90 tablet 3  . lisinopril (PRINIVIL,ZESTRIL) 10 MG tablet Take 1 tablet (10 mg total) by mouth daily. 90 tablet 1   No current  facility-administered medications for this visit.    No Known Allergies  Health Maintenance Health Maintenance  Topic Date Due  . INFLUENZA VACCINE  02/01/2018  . Fecal DNA (Cologuard)  10/27/2018  . TETANUS/TDAP  02/08/2023  . Hepatitis C Screening  Completed  . PNA vac Low Risk Adult  Completed     Exam:  BP (!) 146/53   Pulse 61   Wt 170 lb 1.3 oz (77.1 kg)   BMI 24.40 kg/m  Gen: Well NAD non-toxic appearing HEENT: EOMI,  MMM no carotid bruits Lungs: Normal work of breathing. CTABL Heart: Rate however occasional skipped beats with mild irregularity on auscultation exam.  Additionally patient has a soft nonradiating systolic murmur Abd: NABS, Soft. Nondistended, Nontender Exts: Brisk capillary refill, warm and well perfused.   Neuro alert and oriented normal coordination balance and gait.  Twelve-lead EKG shows normal sinus rhythm at 61 bpm with some sinus arrhythmia.  No ST segment elevation or depression.  Q waves present in leads V4-V6 and aVL and I.    EXAM: CT HEAD WITHOUT CONTRAST  TECHNIQUE: Contiguous axial images were obtained from the base of the skull through the vertex without intravenous contrast.  COMPARISON:  None.  FINDINGS: Brain: There is mild diffuse atrophy. There is no intracranial mass, hemorrhage, extra-axial fluid collection, or midline shift. There is mild patchy small vessel disease in the centra semiovale bilaterally. Elsewhere gray-white  compartments appear normal. No evident acute infarct.  Vascular: There is no appreciable hyperdense vessel. There is no appreciable vascular calcification.  Skull: The bony calvarium appears intact.  Sinuses/Orbits: There is mucosal thickening in several ethmoid air cells. Other visualized paranasal sinuses are clear. Orbits appear symmetric bilaterally.  Other: Visualized mastoid air cells are clear.  IMPRESSION: Mild atrophy with mild patchy small vessel disease in  the periventricular white matter. No acute infarct. No mass or hemorrhage. There is mucosal thickening in several ethmoid air cells.   Electronically Signed   By: Lowella Grip III M.D.   On: 10/05/2017 14:29 I personally (independently) visualized and performed the interpretation of the images attached in this note.     Chemistry      Component Value Date/Time   NA 140 10/05/2017 1404   K 4.5 10/05/2017 1404   CL 109 10/05/2017 1404   CO2 24 10/05/2017 1404   BUN 26 (H) 10/05/2017 1404   CREATININE 0.83 10/05/2017 1404   GLU 79 03/15/2011      Component Value Date/Time   CALCIUM 9.5 10/05/2017 1404   ALKPHOS 67 09/06/2016 0918   AST 26 10/05/2017 1404   ALT 23 10/05/2017 1404   BILITOT 0.9 10/05/2017 1404       Assessment and Plan: 72 y.o. male with near syncope. Etiology is somewhat unclear.  This is likely more cardiac than anything else.  Patient has uncontrolled hypertension and a new cardiac murmur.  He appears safe for now but would benefit from workup.  Plan for metabolic workup listed below as well as echocardiogram.  We will treat hypertension with lisinopril and recheck in a month.  Return sooner if needed.   Orders Placed This Encounter  Procedures  . Lipid Panel w/reflex Direct LDL  . TSH  . Hemoglobin A1c  . ECHOCARDIOGRAM COMPLETE    Standing Status:   Future    Standing Expiration Date:   01/10/2019    Order Specific Question:   Where should this test be performed    Answer:   [facility]    Order Specific Question:   Perflutren DEFINITY (image enhancing agent) should be administered unless hypersensitivity or allergy exist    Answer:   Administer Perflutren    Order Specific Question:   Expected Date:    Answer:   1 month   Meds ordered this encounter  Medications  . lisinopril (PRINIVIL,ZESTRIL) 10 MG tablet    Sig: Take 1 tablet (10 mg total) by mouth daily.    Dispense:  90 tablet    Refill:  1     Discussed warning signs  or symptoms. Please see discharge instructions. Patient expresses understanding.

## 2017-10-10 NOTE — Patient Instructions (Signed)
Thank you for coming in today. Get labs in the near future. You should hear about the heart ultrasound.  Start lisinopril. Recheck with me in 1 month.  Return sooner if needed.    Near-Syncope Near-syncope is when you suddenly become weak or dizzy, or you feel like you might pass out (faint). During an episode of near-syncope, you may:  Feel dizzy or light-headed.  Feel nauseous.  See all white or all black in your field of vision.  Have cold, clammy skin.  This condition is caused by a sudden decrease in blood flow to the brain. This decrease can result from various causes, but most of those causes are not dangerous. However, near-syncope can be a sign of a serious medical problem, so it is important to seek medical care. If you fainted, get medical help right away.Call your local emergency services (911 in the [place] Do not drive yourself to the hospital. Follow these instructions at home: Pay attention to any changes in your symptoms. Take these actions to help with your condition:  Have someone stay with you until you feel stable.  Do not drive, use machinery, or play sports until your health care provider says it is okay.  Keep all follow-up visits as told by your health care provider. This is important.  If you start to feel like you might faint, lie down right away and raise (elevate) your feet above the level of your heart. Breathe deeply and steadily. Wait until all of the symptoms have passed.  Drink enough fluid to keep your urine clear or pale yellow.  If you are taking blood pressure or heart medicine, get up slowly and take several minutes to sit and then stand. This can reduce dizziness.  Take over-the-counter and prescription medicines only as told by your health care provider.  Get help right away if:  You have a severe headache.  You have unusual pain in your chest, abdomen, or back.  You are bleeding from your mouth or rectum, or you have black or tarry  stool.  You have a very fast or irregular heartbeat (palpitations).  You faint once or repeatedly.  You have a seizure.  You are confused.  You have trouble walking.  You have severe weakness.  You have vision problems. These symptoms may represent a serious problem that is an emergency. Do not wait to see if your symptoms will go away. Get medical help right away. Call your local emergency services (911 in the [place] Do not drive yourself to the hospital. This information is not intended to replace advice given to you by your health care provider. Make sure you discuss any questions you have with your health care provider. Document Released: 06/20/2005 Document Revised: 11/26/2015 Document Reviewed: 03/04/2015 Elsevier Interactive Patient Education  2017 [employer].  Lisinopril tablets What is this medicine? LISINOPRIL (lyse IN oh pril) is an ACE inhibitor. This medicine is used to treat high blood pressure and heart failure. It is also used to protect the heart immediately after a heart attack. This medicine may be used for other purposes; ask your health care provider or pharmacist if you have questions. COMMON BRAND NAME(S): Prinivil, Zestril What should I tell my health care provider before I take this medicine? They need to know if you have any of these conditions: -diabetes -heart or blood vessel disease -kidney disease -low blood pressure -previous swelling of the tongue, face, or lips with difficulty breathing, difficulty swallowing, hoarseness, or tightening of the throat -  an unusual or allergic reaction to lisinopril, other ACE inhibitors, insect venom, foods, dyes, or preservatives -pregnant or trying to get pregnant -breast-feeding How should I use this medicine? Take this medicine by mouth with a glass of water. Follow the directions on your prescription label. You may take this medicine with or without food. If it upsets your stomach, take it with food. Take your  medicine at regular intervals. Do not take it more often than directed. Do not stop taking except on your doctor's advice. Talk to your pediatrician regarding the use of this medicine in children. Special care may be needed. While this drug may be prescribed for children as young as 69 years of age for selected conditions, precautions do apply. Overdosage: If you think you have taken too much of this medicine contact a poison control center or emergency room at once. NOTE: This medicine is only for you. Do not share this medicine with others. What if I miss a dose? If you miss a dose, take it as soon as you can. If it is almost time for your next dose, take only that dose. Do not take double or extra doses. What may interact with this medicine? Do not take this medicine with any of the following medications: -hymenoptera venom -sacubitril; valsartan This medicines may also interact with the following medications: -aliskiren -angiotensin receptor blockers, like losartan or valsartan -certain medicines for diabetes -diuretics -everolimus -gold compounds -lithium -NSAIDs, medicines for pain and inflammation, like ibuprofen or naproxen -potassium salts or supplements -salt substitutes -sirolimus -temsirolimus This list may not describe all possible interactions. Give your health care provider a list of all the medicines, herbs, non-prescription drugs, or dietary supplements you use. Also tell them if you smoke, drink alcohol, or use illegal drugs. Some items may interact with your medicine. What should I watch for while using this medicine? Visit your doctor or health care professional for regular check ups. Check your blood pressure as directed. Ask your doctor what your blood pressure should be, and when you should contact him or her. Do not treat yourself for coughs, colds, or pain while you are using this medicine without asking your doctor or health care professional for advice. Some  ingredients may increase your blood pressure. Women should inform their doctor if they wish to become pregnant or think they might be pregnant. There is a potential for serious side effects to an unborn child. Talk to your health care professional or pharmacist for more information. Check with your doctor or health care professional if you get an attack of severe diarrhea, nausea and vomiting, or if you sweat a lot. The loss of too much body fluid can make it dangerous for you to take this medicine. You may get drowsy or dizzy. Do not drive, use machinery, or do anything that needs mental alertness until you know how this drug affects you. Do not stand or sit up quickly, especially if you are an older patient. This reduces the risk of dizzy or fainting spells. Alcohol can make you more drowsy and dizzy. Avoid alcoholic drinks. Avoid salt substitutes unless you are told otherwise by your doctor or health care professional. What side effects may I notice from receiving this medicine? Side effects that you should report to your doctor or health care professional as soon as possible: -allergic reactions like skin rash, itching or hives, swelling of the hands, feet, face, lips, throat, or tongue -breathing problems -signs and symptoms of kidney injury like trouble passing  urine or change in the amount of urine -signs and symptoms of increased potassium like muscle weakness; chest pain; or fast, irregular heartbeat -signs and symptoms of liver injury like dark yellow or brown urine; general ill feeling or flu-like symptoms; light-colored stools; loss of appetite; nausea; right upper belly pain; unusually weak or tired; yellowing of the eyes or skin -signs and symptoms of low blood pressure like dizziness; feeling faint or lightheaded, falls; unusually weak or tired -stomach pain with or without nausea and vomiting Side effects that usually do not require medical attention (report to your doctor or health  care professional if they continue or are bothersome): -changes in taste -cough -dizziness -fever -headache -sensitivity to light This list may not describe all possible side effects. Call your doctor for medical advice about side effects. You may report side effects to FDA at 1-800-FDA-1088. Where should I keep my medicine? Keep out of the reach of children. Store at room temperature between 15 and 30 degrees C (59 and 86 degrees F). Protect from moisture. Keep container tightly closed. Throw away any unused medicine after the expiration date. NOTE: This sheet is a summary. It may not cover all possible information. If you have questions about this medicine, talk to your doctor, pharmacist, or health care provider.  2018 Elsevier/Gold Standard (2015-08-10 12:52:35)

## 2017-10-13 DIAGNOSIS — R011 Cardiac murmur, unspecified: Secondary | ICD-10-CM | POA: Diagnosis not present

## 2017-10-13 DIAGNOSIS — R42 Dizziness and giddiness: Secondary | ICD-10-CM | POA: Diagnosis not present

## 2017-10-13 DIAGNOSIS — R7303 Prediabetes: Secondary | ICD-10-CM | POA: Diagnosis not present

## 2017-10-13 DIAGNOSIS — R55 Syncope and collapse: Secondary | ICD-10-CM | POA: Diagnosis not present

## 2017-10-13 DIAGNOSIS — E782 Mixed hyperlipidemia: Secondary | ICD-10-CM | POA: Diagnosis not present

## 2017-10-13 DIAGNOSIS — I10 Essential (primary) hypertension: Secondary | ICD-10-CM | POA: Diagnosis not present

## 2017-10-14 LAB — LIPID PANEL W/REFLEX DIRECT LDL
Cholesterol: 138 mg/dL (ref <200)
HDL: 44 mg/dL (ref >40)
LDL CHOLESTEROL (CALC): 78 mg/dL
NON-HDL CHOLESTEROL (CALC): 94 mg/dL (ref <130)
Total CHOL/HDL Ratio: 3.1 (calc) (ref <5.0)
Triglycerides: 82 mg/dL (ref <150)

## 2017-10-14 LAB — HEMOGLOBIN A1C
HEMOGLOBIN A1C: 5.5 %{Hb} (ref <5.7)
Mean Plasma Glucose: 111 (calc)
eAG (mmol/L): 6.2 (calc)

## 2017-10-14 LAB — TSH: TSH: 2.45 mIU/L (ref 0.40–4.50)

## 2017-10-16 MED ORDER — SIMVASTATIN 20 MG PO TABS: 20.0000 mg | ORAL_TABLET | Freq: Every day | ORAL | 3 refills | Status: DC

## 2017-10-16 NOTE — Addendum Note (Signed)
Addended by: Gregor Hams on: 10/16/2017 03:42 PM   Modules accepted: Orders

## 2017-10-16 NOTE — Addendum Note (Signed)
Addended by: Huel Cote on: 10/16/2017 03:58 PM   Modules accepted: Orders

## 2017-10-24 ENCOUNTER — Telehealth: Payer: Self-pay

## 2017-10-24 NOTE — Telephone Encounter (Signed)
Great.  We will recheck in May in person. We likely will increase the dose then.

## 2017-11-09 ENCOUNTER — Encounter: Payer: Self-pay | Admitting: Family Medicine

## 2017-11-09 ENCOUNTER — Ambulatory Visit (INDEPENDENT_AMBULATORY_CARE_PROVIDER_SITE_OTHER): Payer: Medicare Other | Admitting: Family Medicine

## 2017-11-09 VITALS — BP 105/48 | HR 56 | Ht 70.0 in | Wt 167.0 lb

## 2017-11-09 DIAGNOSIS — E782 Mixed hyperlipidemia: Secondary | ICD-10-CM | POA: Diagnosis not present

## 2017-11-09 DIAGNOSIS — R55 Syncope and collapse: Secondary | ICD-10-CM | POA: Diagnosis not present

## 2017-11-09 DIAGNOSIS — I10 Essential (primary) hypertension: Secondary | ICD-10-CM

## 2017-11-09 NOTE — Progress Notes (Signed)
Nicholas Murillo is a 72 y.o. male who presents to [facility]: [place] today for follow-up syncope.  Tavaras was seen about a month ago for a syncopal event.  In the interim he is feeling great with no issues.  He denies chest pain palpitations or shortness of breath.  He denies lightheadedness or dizziness.  He takes medications listed below.  He notes he checks his blood pressure regularly at home and notes is typically in the 130s range.  He has not heard about the echocardiogram that was ordered.   Past Medical History:  Diagnosis Date  . Esophageal stricture   . Hiatal hernia   . Hyperlipidemia    Past Surgical History:  Procedure Laterality Date  . APPENDECTOMY  1960  . arterial catheter  1997   Social History   Tobacco Use  . Smoking status: Never Smoker  . Smokeless tobacco: Never Used  Substance Use Topics  . Alcohol use: No    Alcohol/week: 0.0 oz    Comment: quit 20 years ago   family history includes Colon polyps in his mother; Dementia in his mother; Skin cancer in his father.  ROS as above:  Medications: Current Outpatient Medications  Medication Sig Dispense Refill  . aspirin EC 81 MG tablet Take 81 mg by mouth daily.    Marland Kitchen EPINEPHrine (EPIPEN 2-PAK) 0.3 mg/0.3 mL IJ SOAJ injection Inject 0.3 mLs (0.3 mg total) into the muscle once. 1 Device 0  . lisinopril (PRINIVIL,ZESTRIL) 10 MG tablet Take 1 tablet (10 mg total) by mouth daily. 90 tablet 1  . magnesium oxide (MAG-OX) 400 MG tablet Take 800 mg by mouth daily.    . simvastatin (ZOCOR) 20 MG tablet Take 1 tablet (20 mg total) by mouth daily. 90 tablet 3   No current facility-administered medications for this visit.    No Known Allergies  Health Maintenance Health Maintenance  Topic Date Due  . INFLUENZA VACCINE  02/01/2018  . Fecal DNA (Cologuard)  10/27/2018  . TETANUS/TDAP  02/08/2023  .  Hepatitis C Screening  Completed  . PNA vac Low Risk Adult  Completed     Exam:  BP (!) 105/48   Pulse (!) 56   Ht 5\' 10"  (1.778 m)   Wt 167 lb (75.8 kg)   BMI 23.96 kg/m  Gen: Well NAD HEENT: EOMI,  MMM Lungs: Normal work of breathing. CTABL Heart: RRR no MRG heart rate 62 bpm per my check Abd: NABS, Soft. Nondistended, Nontender Exts: Brisk capillary refill, warm and well perfused.     Chemistry      Component Value Date/Time   NA 140 10/05/2017 1404   K 4.5 10/05/2017 1404   CL 109 10/05/2017 1404   CO2 24 10/05/2017 1404   BUN 26 (H) 10/05/2017 1404   CREATININE 0.83 10/05/2017 1404   GLU 79 03/15/2011      Component Value Date/Time   CALCIUM 9.5 10/05/2017 1404   ALKPHOS 67 09/06/2016 0918   AST 26 10/05/2017 1404   ALT 23 10/05/2017 1404   BILITOT 0.9 10/05/2017 1404     Lab Results  Component Value Date   CHOL 138 10/13/2017   HDL 44 10/13/2017   LDLCALC 78 10/13/2017   TRIG 82 10/13/2017   CHOLHDL 3.1 10/13/2017    Lab Results  Component Value Date   CHOL 138 10/13/2017   HDL 44 10/13/2017   LDLCALC 78 10/13/2017   TRIG 82 10/13/2017  CHOLHDL 3.1 10/13/2017      Assessment and Plan: 72 y.o. male with  Hypertension: Doing reasonably well.  Blood pressure slightly low today but patient is asymptomatic.  Plan for watchful waiting and continued home blood pressure recording.  If patient gets significantly symptomatic next step would be to cut the lisinopril in half.  History of syncope: Likely was orthostasis.  Stressed the importance of good hydration while on lisinopril.  Will arrange for echocardiogram.  If all is well recheck in 6 months.  Lipids: Recent LDL was 78.  This seems reasonable.  Continue simvastatin.  Patient would like to avoid higher doses of simvastatin if possible.    Discussed warning signs or symptoms. Please see discharge instructions. Patient expresses understanding.

## 2017-11-09 NOTE — Patient Instructions (Signed)
Thank you for coming in today. You should hear about the heart ultrasound soon.  Let me know if you do not hear anything.  Recheck with me in 3 months or sooner if needed.

## 2017-12-01 ENCOUNTER — Ambulatory Visit (HOSPITAL_BASED_OUTPATIENT_CLINIC_OR_DEPARTMENT_OTHER)
Admission: RE | Admit: 2017-12-01 | Discharge: 2017-12-01 | Disposition: A | Discharge status: Home / Self Care | Payer: Medicare Other | Source: Ambulatory Visit | Attending: Family Medicine | Admitting: Family Medicine

## 2017-12-01 DIAGNOSIS — R42 Dizziness and giddiness: Secondary | ICD-10-CM

## 2017-12-01 DIAGNOSIS — I77810 Thoracic aortic ectasia: Secondary | ICD-10-CM | POA: Insufficient documentation

## 2017-12-01 DIAGNOSIS — R55 Syncope and collapse: Secondary | ICD-10-CM | POA: Diagnosis not present

## 2017-12-01 DIAGNOSIS — I10 Essential (primary) hypertension: Secondary | ICD-10-CM | POA: Insufficient documentation

## 2017-12-01 DIAGNOSIS — E785 Hyperlipidemia, unspecified: Secondary | ICD-10-CM | POA: Insufficient documentation

## 2017-12-01 DIAGNOSIS — R011 Cardiac murmur, unspecified: Secondary | ICD-10-CM

## 2017-12-01 DIAGNOSIS — I080 Rheumatic disorders of both mitral and aortic valves: Secondary | ICD-10-CM | POA: Insufficient documentation

## 2017-12-01 NOTE — Progress Notes (Signed)
Echocardiogram 2D Echocardiogram has been performed.  Nicholas Murillo 12/01/2017, 9:12 AM

## 2017-12-04 ENCOUNTER — Encounter: Payer: Self-pay | Admitting: Family Medicine

## 2017-12-04 DIAGNOSIS — I77810 Thoracic aortic ectasia: Secondary | ICD-10-CM | POA: Insufficient documentation

## 2017-12-13 ENCOUNTER — Telehealth: Payer: Self-pay

## 2017-12-13 NOTE — Telephone Encounter (Signed)
Patient called stated that he has hemorrhoids  And he has tried things OTC and nothing is working. Patient wants to know what his next step is. Please advise.Rhonda Cunningham,CMA

## 2017-12-14 NOTE — Telephone Encounter (Signed)
We should take a look. Please schedule an appointment.

## 2017-12-15 NOTE — Telephone Encounter (Signed)
Left detailed message on patient vm with results as noted below. Rhonda Cunningham,CMA  

## 2017-12-16 ENCOUNTER — Encounter (HOSPITAL_BASED_OUTPATIENT_CLINIC_OR_DEPARTMENT_OTHER): Payer: Self-pay | Admitting: Emergency Medicine

## 2017-12-16 ENCOUNTER — Emergency Department (INDEPENDENT_AMBULATORY_CARE_PROVIDER_SITE_OTHER)
Admission: EM | Admit: 2017-12-16 | Discharge: 2017-12-16 | Disposition: A | Discharge status: Home / Self Care | Payer: Medicare Other | Source: Home / Self Care | Attending: Family Medicine | Admitting: Family Medicine

## 2017-12-16 ENCOUNTER — Encounter: Payer: Self-pay | Admitting: Emergency Medicine

## 2017-12-16 ENCOUNTER — Other Ambulatory Visit: Payer: Self-pay

## 2017-12-16 ENCOUNTER — Emergency Department (HOSPITAL_BASED_OUTPATIENT_CLINIC_OR_DEPARTMENT_OTHER)
Admission: EM | Admit: 2017-12-16 | Discharge: 2017-12-16 | Disposition: A | Discharge status: Home / Self Care | Payer: Medicare Other | Attending: Emergency Medicine | Admitting: Emergency Medicine

## 2017-12-16 ENCOUNTER — Emergency Department (HOSPITAL_BASED_OUTPATIENT_CLINIC_OR_DEPARTMENT_OTHER): Payer: Medicare Other

## 2017-12-16 DIAGNOSIS — K6289 Other specified diseases of anus and rectum: Secondary | ICD-10-CM

## 2017-12-16 DIAGNOSIS — K602 Anal fissure, unspecified: Secondary | ICD-10-CM | POA: Insufficient documentation

## 2017-12-16 DIAGNOSIS — Z79899 Other long term (current) drug therapy: Secondary | ICD-10-CM | POA: Diagnosis not present

## 2017-12-16 DIAGNOSIS — Z7982 Long term (current) use of aspirin: Secondary | ICD-10-CM | POA: Diagnosis not present

## 2017-12-16 DIAGNOSIS — K769 Liver disease, unspecified: Secondary | ICD-10-CM | POA: Diagnosis not present

## 2017-12-16 DIAGNOSIS — R110 Nausea: Secondary | ICD-10-CM

## 2017-12-16 DIAGNOSIS — I10 Essential (primary) hypertension: Secondary | ICD-10-CM | POA: Insufficient documentation

## 2017-12-16 DIAGNOSIS — R1030 Lower abdominal pain, unspecified: Secondary | ICD-10-CM

## 2017-12-16 DIAGNOSIS — K5730 Diverticulosis of large intestine without perforation or abscess without bleeding: Secondary | ICD-10-CM | POA: Diagnosis not present

## 2017-12-16 DIAGNOSIS — N281 Cyst of kidney, acquired: Secondary | ICD-10-CM | POA: Diagnosis not present

## 2017-12-16 LAB — COMPREHENSIVE METABOLIC PANEL
ALBUMIN: 4.1 g/dL (ref 3.5–5.0)
ALK PHOS: 66 U/L (ref 38–126)
ALT: 19 U/L (ref 17–63)
AST: 23 U/L (ref 15–41)
Anion gap: 7 (ref 5–15)
BUN: 30 mg/dL — ABNORMAL HIGH (ref 6–20)
CALCIUM: 8.9 mg/dL (ref 8.9–10.3)
CHLORIDE: 105 mmol/L (ref 101–111)
CO2: 27 mmol/L (ref 22–32)
Creatinine, Ser: 0.9 mg/dL (ref 0.61–1.24)
GFR calc non Af Amer: >60 mL/min (ref >60)
Glucose, Bld: 87 mg/dL (ref 65–99)
Potassium: 4.5 mmol/L (ref 3.5–5.1)
SODIUM: 139 mmol/L (ref 135–145)
Total Bilirubin: 0.9 mg/dL (ref 0.3–1.2)
Total Protein: 6.3 g/dL — ABNORMAL LOW (ref 6.5–8.1)

## 2017-12-16 LAB — CBC WITH DIFFERENTIAL/PLATELET
Basophils Absolute: 0 10*3/uL (ref 0.0–0.1)
Basophils Relative: 0 %
EOS ABS: 0.2 10*3/uL (ref 0.0–0.7)
Eosinophils Relative: 3 %
HEMATOCRIT: 44.1 % (ref 39.0–52.0)
Hemoglobin: 15.1 g/dL (ref 13.0–17.0)
LYMPHS ABS: 1.7 10*3/uL (ref 0.7–4.0)
Lymphocytes Relative: 23 %
MCH: 32.1 pg (ref 26.0–34.0)
MCHC: 34.2 g/dL (ref 30.0–36.0)
MCV: 93.6 fL (ref 78.0–100.0)
Monocytes Absolute: 0.6 10*3/uL (ref 0.1–1.0)
Monocytes Relative: 8 %
NEUTROS PCT: 66 %
Neutro Abs: 4.8 10*3/uL (ref 1.7–7.7)
Platelets: 139 10*3/uL — ABNORMAL LOW (ref 150–400)
RBC: 4.71 MIL/uL (ref 4.22–5.81)
RDW: 12.6 % (ref 11.5–15.5)
WBC: 7.3 10*3/uL (ref 4.0–10.5)

## 2017-12-16 LAB — LIPASE, BLOOD: Lipase: 27 U/L (ref 11–51)

## 2017-12-16 LAB — OCCULT BLOOD X 1 CARD TO LAB, STOOL: FECAL OCCULT BLD: NEGATIVE

## 2017-12-16 IMAGING — CT CT ABD-PELV W/ CM
2 of 5 series · 16 of 46 positions shown, 18 images · IV contrast (APPLIED)
Comparison: None.

CLINICAL DATA: Pt with rectal pain, hemorrhoid pain x 10 days,
denies bleeding, complains of weakness/fatigue Hx Hiatal Hernia,
esophageal stricture, appy

EXAM:
CT ABDOMEN AND PELVIS WITH CONTRAST
TECHNIQUE: Multidetector CT imaging of the abdomen and pelvis was performed
using the standard protocol following bolus administration of
intravenous contrast.
CONTRAST:  100mL [FE] IOPAMIDOL ([FE]) INJECTION 61%

[Series 2: axial st · axial · 0.80mm/px · z∈[-558,-98]mm · 13 of 104 slices shown, 15 images]
[im 6/104  soft-tissue]
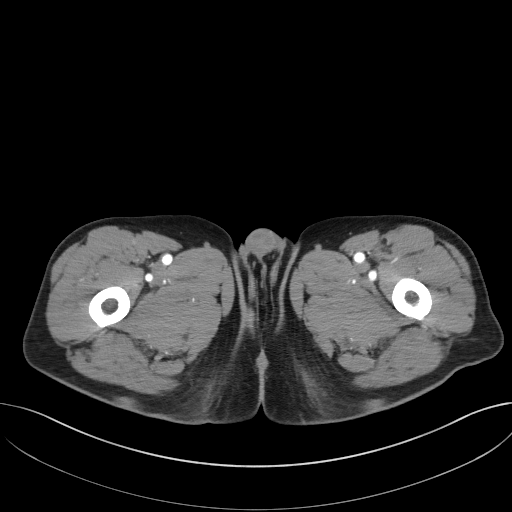
[im 6/104  bone]
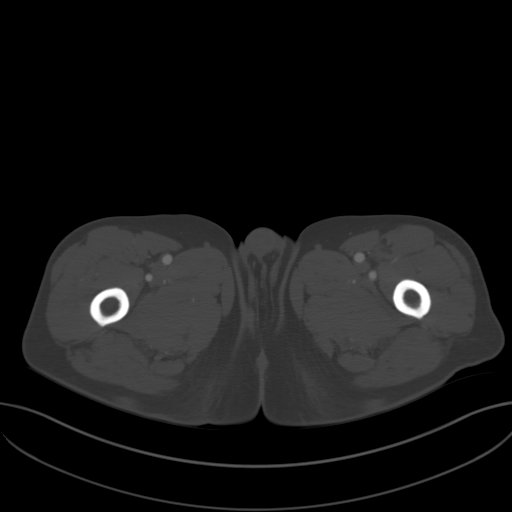
[im 12/104  soft-tissue]
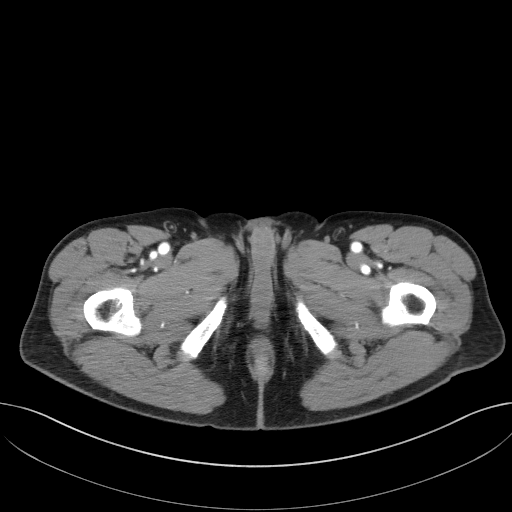
[im 23/104  soft-tissue]
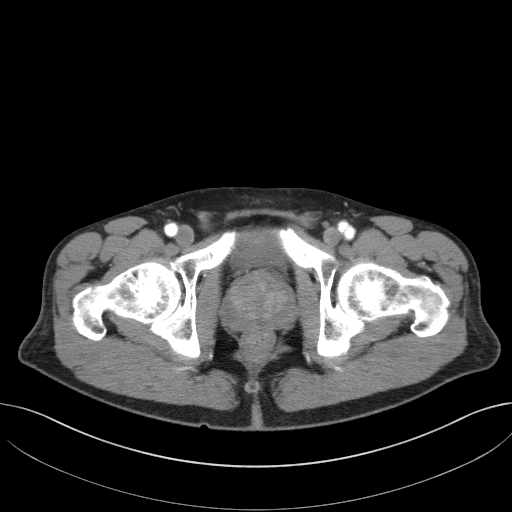
[im 29/104  soft-tissue]
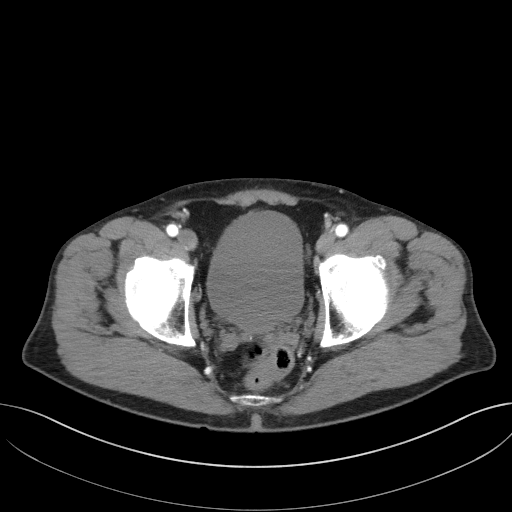
[im 35/104  soft-tissue]
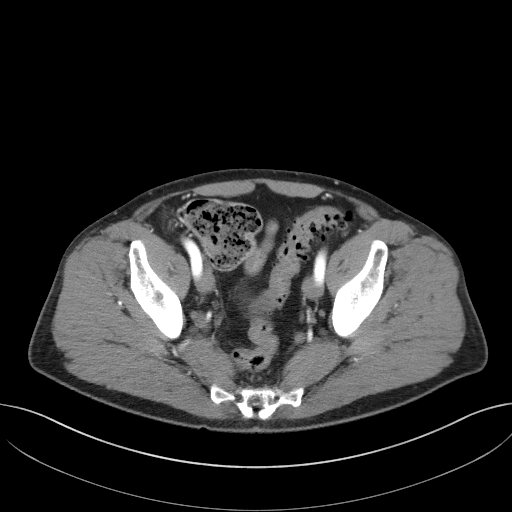
[im 46/104  soft-tissue]
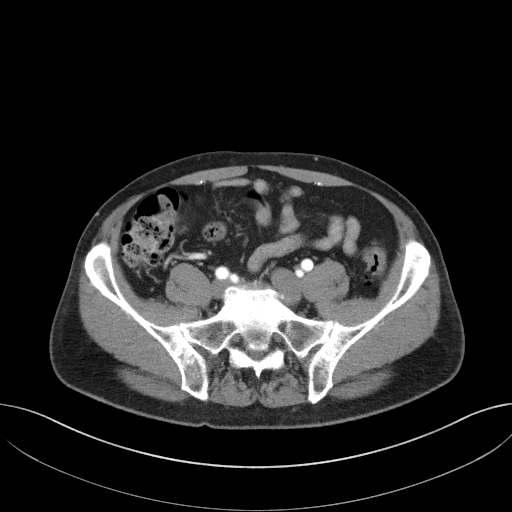
[im 52/104  soft-tissue]
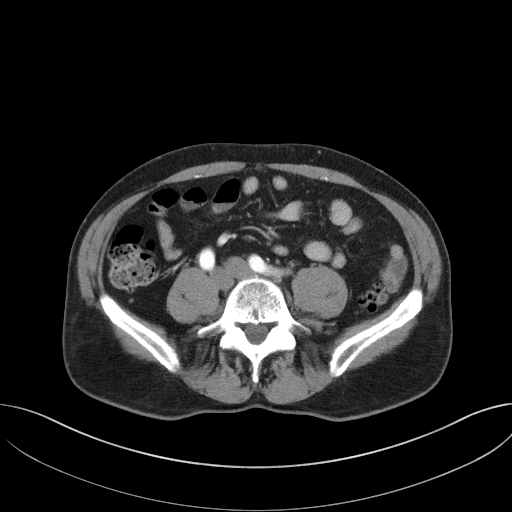
[im 58/104  soft-tissue]
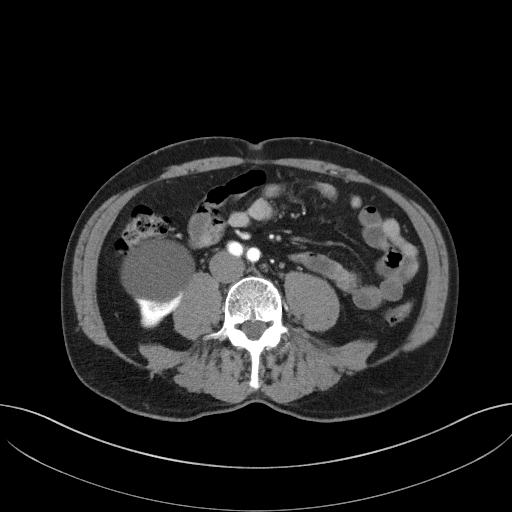
[im 69/104  soft-tissue]
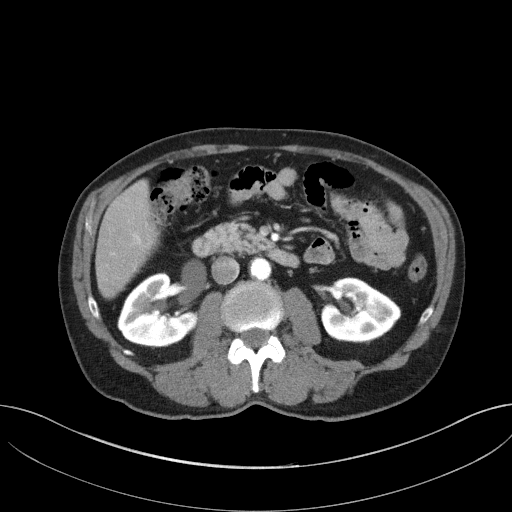
[im 69/104  bone]
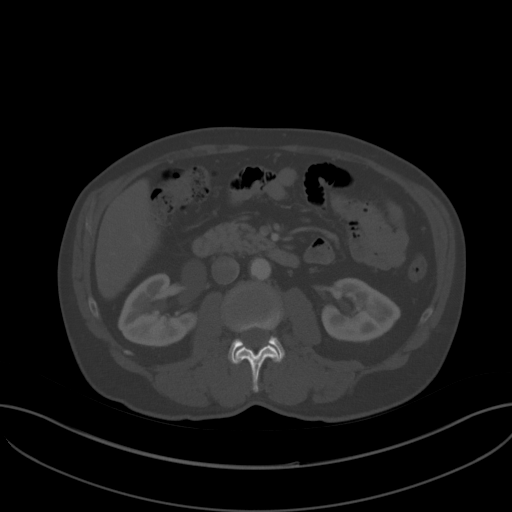
[im 75/104  soft-tissue]
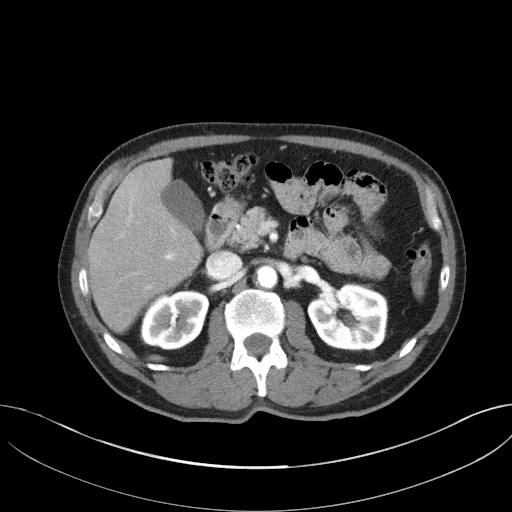
[im 81/104  soft-tissue]
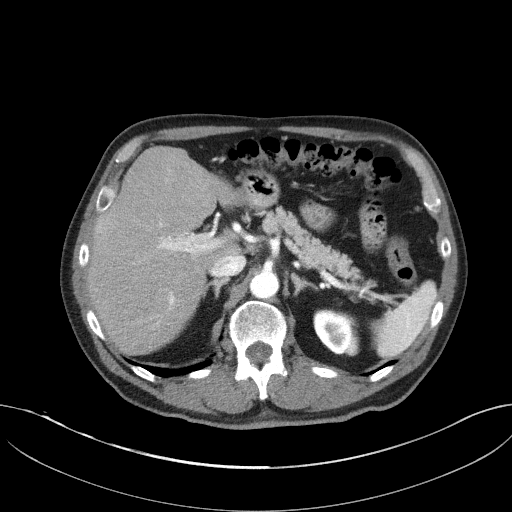
[im 92/104  soft-tissue]
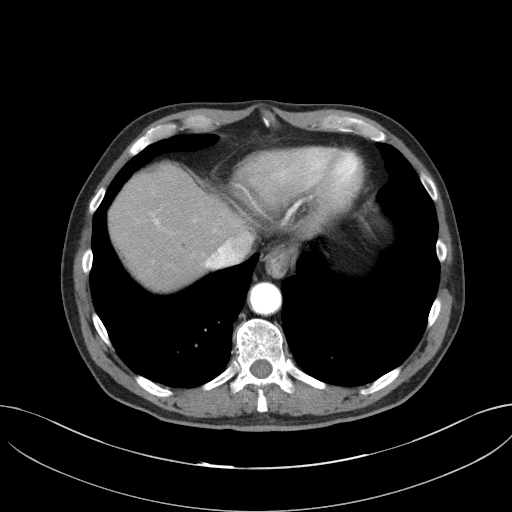
[im 98/104  soft-tissue]
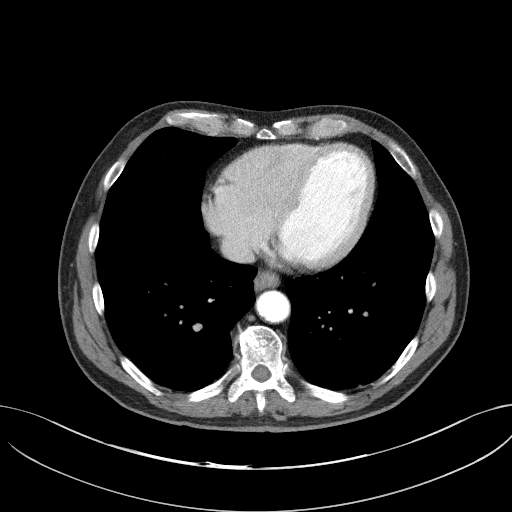

[Series 5: coronal st · coronal · 0.76mm/px · 3 of 87 slices shown]
[im 29/87  soft-tissue]
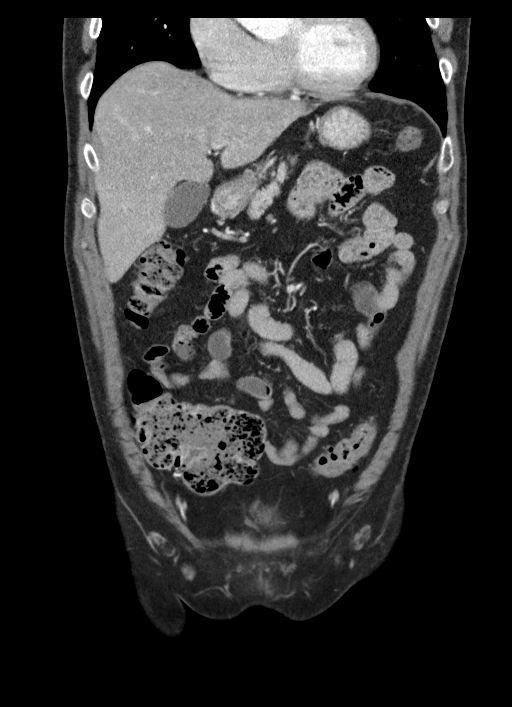
[im 39/87  soft-tissue]
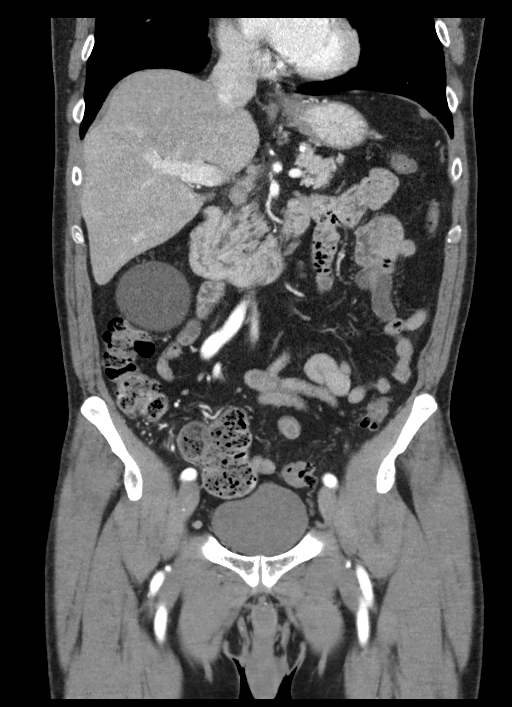
[im 48/87  soft-tissue]
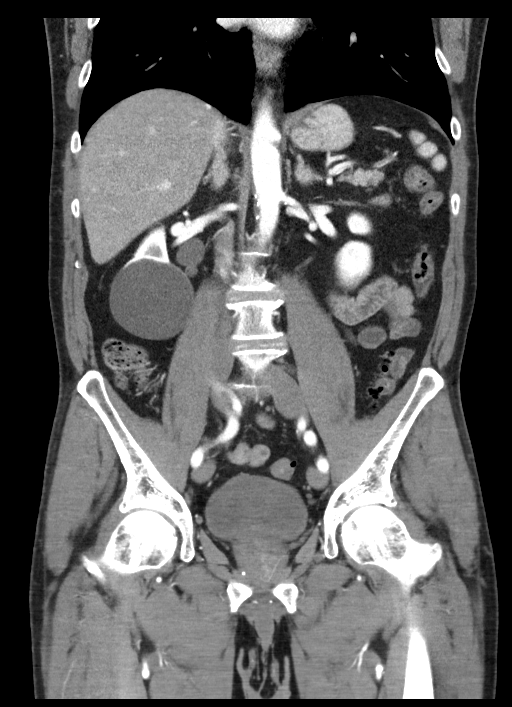

[16 of 46 positions shown; findings below may reference images not displayed]

FINDINGS: Lower chest: Lung bases are clear.

Hepatobiliary: Low-density lesion in the subcapsular LEFT hepatic
lobe is consistent benign hepatic cyst (image [DATE]). Similar lesion
on image [DATE] LEFT hepatic lobe is too small characterize. No
biliary duct dilatation. Gallbladder normal

Pancreas: Pancreas is normal. No ductal dilatation. No pancreatic
inflammation.

Spleen: Normal spleen

Adrenals/urinary tract: Adrenal glands normal. Large nonenhancing
cysts of the RIGHT kidney measures 6.5 cm in diameter. No
obstructive uropathy. Bladder normal

Stomach/Bowel: Small hiatal hernia. Stomach, duodenum, small-bowel
cecum normal. Post appendectomy. There multiple diverticular the
descending colon without acute inflammation. Rectum normal.

Vascular/Lymphatic: Abdominal aorta is normal caliber. No periportal
or retroperitoneal adenopathy. No pelvic adenopathy.

Reproductive: Prostate normal

Other: No free fluid.

Musculoskeletal: No aggressive osseous lesion.
IMPRESSION: 1. No acute findings in the abdomen pelvis.
2. Benign appearing cysts in the liver and kidney
3. LEFT colon diverticulosis without evidence acute diverticulitis

## 2017-12-16 MED ORDER — POLYETHYLENE GLYCOL 3350 17 G PO PACK: 17.0000 g | PACK | Freq: Every day | ORAL | 0 refills | Status: DC

## 2017-12-16 MED ORDER — HYDROCODONE-ACETAMINOPHEN 5-325 MG PO TABS: 1.0000 | ORAL_TABLET | Freq: Four times a day (QID) | ORAL | 0 refills | Status: DC | PRN

## 2017-12-16 MED ORDER — SODIUM CHLORIDE 0.9 % IV SOLN
INTRAVENOUS | Status: DC
  Administered 2017-12-16: 19:00:00 via INTRAVENOUS

## 2017-12-16 MED ORDER — IOPAMIDOL (ISOVUE-300) INJECTION 61%
100.0000 mL | Freq: Once | INTRAVENOUS | Status: AC | PRN
  Administered 2017-12-16: 100 mL via INTRAVENOUS

## 2017-12-16 NOTE — ED Triage Notes (Signed)
Patient c/o hemorrhoid pain on and off for 10 days, gotten worse since Wednesday, pain deep inside of groin area, nausea as well.  Patient has tried topical ointment, suppositories, hot tube of water, colace for soft stools.

## 2017-12-16 NOTE — ED Provider Notes (Signed)
[facility]    CSN: [id number] Arrival date & time: 12/16/17  1256     History   Chief Complaint Chief Complaint  Patient presents with  . Hemorrhoids    HPI Nicholas Murillo is a 72 y.o. male.   HPI  Nicholas Murillo is a 72 y.o. male presenting to UC with c/o rectal pain that started about 10 days ago, intermittent aching and soreness. He has some temporary relief with OTC topical and suppository hemorrhoid treatments but pain has worsened and become constant the last 3-4 days.  Associated deep pain inside his groin area into his lower abdomen ans associated nausea but no vomiting. Denies urinary symptoms. Appendectomy "many years ago."   Past Medical History:  Diagnosis Date  . Esophageal stricture   . Hiatal hernia   . Hyperlipidemia     Patient Active Problem List   Diagnosis Date Noted  . 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 Bilateral hand numbness 09/06/2016  . Elevated PSA 10/14/2015  . 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 Prediabetes 07/29/2013  . Hyperlipidemia 10/23/2012  . Essential hypertension, benign 10/17/2012  . Esophageal stricture 07/10/2012  . Hiatal hernia 07/10/2012  . Headache(784.0) 06/18/2012    Past Surgical History:  Procedure Laterality Date  . APPENDECTOMY  1960  . arterial catheter  1997       Home Medications    Prior to Admission medications   Medication Sig Start Date End Date Taking? Authorizing Provider  aspirin EC 81 MG tablet Take 81 mg by mouth daily.    [provider]  EPINEPHrine (EPIPEN 2-PAK) 0.3 mg/0.3 mL IJ SOAJ injection Inject 0.3 mLs (0.3 mg total) into the muscle once. 06/23/15   Palumbo, April, MD  lisinopril (PRINIVIL,ZESTRIL) 10 MG tablet Take 1 tablet (10 mg total) by mouth daily. 10/10/17   Gregor Hams, MD  magnesium oxide (MAG-OX) 400 MG tablet Take 800 mg by mouth daily.    [provider]  simvastatin (ZOCOR) 20 MG tablet Take 1 tablet (20 mg total) by mouth daily.  10/16/17   Gregor Hams, MD    Family History Family History  Problem Relation Age of Onset  . Skin cancer Father   . Colon polyps Mother        benign  . Dementia Mother   . Colon cancer Neg Hx   . Stomach cancer Neg Hx   . Esophageal cancer Neg Hx     Social History Social History   Tobacco Use  . Smoking status: Never Smoker  . Smokeless tobacco: Never Used  Substance Use Topics  . Alcohol use: No    Alcohol/week: 0.0 oz    Comment: quit 20 years ago  . 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   Review of Systems Review of Systems  Constitutional: Negative for chills and fever.  Gastrointestinal: Positive for abdominal pain, nausea and rectal pain. Negative for blood in stool, constipation, diarrhea and vomiting.  Genitourinary: Negative for dysuria, flank pain, frequency and hematuria.  Musculoskeletal: Negative for back pain and myalgias.     Physical Exam Triage Vital Signs ED Triage Vitals  Enc Vitals Group     BP 12/16/17 1339 119/63     Pulse Rate 12/16/17 1339 (!) 54     Resp --      Temp 12/16/17 1339 98.2 F (36.8 C)  Temp Source 12/16/17 1339 Oral     SpO2 12/16/17 1339 98 %     Weight 12/16/17 1340 165 lb 4 oz (75 kg)     Height 12/16/17 1340 5\' 10"  (1.778 m)     Head Circumference --      Peak Flow --      Pain Score 12/16/17 1340 5     Pain Loc --      Pain Edu? --      Excl. in [place]? --    No data found.  Updated Vital Signs BP 119/63 (BP Location: Right Arm)   Pulse (!) 54   Temp 98.2 F (36.8 C) (Oral)   Ht 5\' 10"  (1.778 m)   Wt 165 lb 4 oz (75 kg)   SpO2 98%   BMI 23.71 kg/m   Visual Acuity Right Eye Distance:   Left Eye Distance:   Bilateral Distance:    Right Eye Near:   Left Eye Near:    Bilateral Near:     Physical Exam  Constitutional: He is oriented to person, place, and time. He appears well-developed and  well-nourished. No distress.  HENT:  Head: Normocephalic and atraumatic.  Eyes: EOM are normal.  Neck: Normal range of motion.  Cardiovascular: Normal rate and regular rhythm.  Pulmonary/Chest: Effort normal and breath sounds normal. No stridor. No respiratory distress.  Abdominal: There is tenderness in the right lower quadrant and suprapubic area. There is no CVA tenderness.  Genitourinary: Rectal exam shows tenderness. Rectal exam shows no external hemorrhoid.  Genitourinary Comments: Chaperoned exam. Tenderness around rectum. Skin in tact. No external hemorrhoids noted. No erythema or warmth. No anal fissures.   Musculoskeletal: Normal range of motion.  Neurological: He is alert and oriented to person, place, and time.  Skin: Skin is warm and dry. He is not diaphoretic.  Psychiatric: He has a normal mood and affect. His behavior is normal.  Nursing note and vitals reviewed.    UC Treatments / Results  Labs (all labs ordered are listed, but only abnormal results are displayed) Labs Reviewed - No data to display  EKG None  Radiology No results found.  Procedures Procedures (including critical care time)  Medications Ordered in UC Medications - No data to display  Initial Impression / Assessment and Plan / UC Course  I have reviewed the triage vital signs and the nursing notes.  Pertinent labs & imaging results that were available during my care of the patient were reviewed by me and considered in my medical decision making (see chart for details).     No hemorrhoids seen on exam. Concerned for pt's reported "Deep pain" in rectal area with associated nausea. Concern for possible internal soft tissue infection Recommend pt go to the hospital today for further evaluation.   Final Clinical Impressions(s) / UC Diagnoses   Final diagnoses:  Rectal pain  Lower abdominal pain  Nausea without vomiting     Discharge Instructions      Your exam and symptoms are  concerning for a possible infection that cannot be seen on an external exam.   It is recommended you go to the hospital for further evaluation with labs and likely an imaging study such as an ultrasound or CT scan.  It is recommended you go to the hospital today to get this taken care of so you can get the proper treatment as soon as possible.     ED Prescriptions    None  Controlled Substance Prescriptions [place] Controlled Substance Registry consulted? Not Applicable   Tyrell Antonio 12/16/17 1546

## 2017-12-16 NOTE — ED Notes (Signed)
Alert, NAD, calm, interactive, resps e/u, speaking in clear complete sentences, no dyspnea noted, skin W&D, VSS, "feel better, feel normal", (denies: pain, sob, nausea, dizziness or visual changes).

## 2017-12-16 NOTE — ED Triage Notes (Addendum)
Patient reports rectal pain x 10 days.  Sent from [facility] for CT scan/ultrasound of area.  Denies blood in stools.

## 2017-12-16 NOTE — Discharge Instructions (Addendum)
Work-up here clinically consistent with an anal fissure.  Take the pain medicine as directed.  Make an appointment follow-up with gastroenterology.  Return for any new or worse symptoms.  Also take the stool softener MiraLAX as directed each day.

## 2017-12-16 NOTE — ED Provider Notes (Signed)
[facility] Provider Note   CSN: [id number] Arrival date & time: 12/16/17  1432     History   Chief Complaint Chief Complaint  Patient presents with  . Rectal Pain    HPI Nicholas Murillo is a 72 y.o. male.  Patient sent over from [facility] urgent care.  Patient's had a history of some rectal discomfort for about 10 days which got worse today.  Evaluated there were not sure exactly what was going on so sent here for further evaluation and possible CT scan of the abdomen.  Associated with a nausea feeling occasionally some irritation and some rectal pain.  No blood in the bowel movements.  Patient has not had a colonoscopy.  No fevers.  Symptoms are worse with bowel movements.      Past Medical History:  Diagnosis Date  . Esophageal stricture   . Hiatal hernia   . Hyperlipidemia     Patient Active Problem List   Diagnosis Date Noted  . 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 Bilateral hand numbness 09/06/2016  . Elevated PSA 10/14/2015  . 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 Prediabetes 07/29/2013  . Hyperlipidemia 10/23/2012  . Essential hypertension, benign 10/17/2012  . Esophageal stricture 07/10/2012  . Hiatal hernia 07/10/2012  . Headache(784.0) 06/18/2012    Past Surgical History:  Procedure Laterality Date  . APPENDECTOMY  1960  . arterial catheter  1997        Home Medications    Prior to Admission medications   Medication Sig Start Date End Date Taking? Authorizing Provider  aspirin EC 81 MG tablet Take 81 mg by mouth daily.    [provider]  EPINEPHrine (EPIPEN 2-PAK) 0.3 mg/0.3 mL IJ SOAJ injection Inject 0.3 mLs (0.3 mg total) into the muscle once. 06/23/15   Palumbo, April, MD  HYDROcodone-acetaminophen (NORCO/VICODIN) 5-325 MG tablet Take 1-2 tablets by mouth every 6 (six) hours as needed for moderate pain. 12/16/17   Fredia Sorrow, MD  lisinopril (PRINIVIL,ZESTRIL) 10 MG tablet Take 1 tablet  (10 mg total) by mouth daily. 10/10/17   Gregor Hams, MD  magnesium oxide (MAG-OX) 400 MG tablet Take 800 mg by mouth daily.    [provider]  polyethylene glycol (MIRALAX) packet Take 17 g by mouth daily. 12/16/17   Fredia Sorrow, MD  simvastatin (ZOCOR) 20 MG tablet Take 1 tablet (20 mg total) by mouth daily. 10/16/17   Gregor Hams, MD    Family History Family History  Problem Relation Age of Onset  . Skin cancer Father   . Colon polyps Mother        benign  . Dementia Mother   . Colon cancer Neg Hx   . Stomach cancer Neg Hx   . Esophageal cancer Neg Hx     Social History Social History   Tobacco Use  . Smoking status: Never Smoker  . Smokeless tobacco: Never Used  Substance Use Topics  . Alcohol use: No    Alcohol/week: 0.0 oz    Comment: quit 20 years ago  . 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   Review of Systems Review of Systems  Constitutional: Negative for fever.  HENT: Negative for congestion.   Eyes: Negative for visual disturbance.  Respiratory: Negative for shortness of breath.   Cardiovascular: Negative for chest pain.  Gastrointestinal: Positive for nausea and rectal  pain. Negative for anal bleeding and vomiting.  Genitourinary: Negative for dysuria.  Musculoskeletal: Negative for back pain.  Skin: Negative for rash.  Neurological: Negative for headaches.  Hematological: Does not bruise/bleed easily.  Psychiatric/Behavioral: Negative for confusion.     Physical Exam Updated Vital Signs BP (!) 137/58   Pulse (!) 58   Temp 97.9 F (36.6 C)   Resp 18   Ht 1.778 m (5\' 10" )   Wt 74.8 kg (164 lb 14.5 oz)   SpO2 99%   BMI 23.66 kg/m   Physical Exam  Constitutional: He is oriented to person, place, and time. He appears well-developed and well-nourished. No distress.  HENT:  Head: Normocephalic and atraumatic.  Mouth/Throat: Oropharynx is  clear and moist.  Eyes: Pupils are equal, round, and reactive to light. Conjunctivae and EOM are normal.  Neck: Neck supple.  Cardiovascular: Normal rate, regular rhythm and normal heart sounds.  Pulmonary/Chest: Effort normal and breath sounds normal.  Abdominal: Soft. Bowel sounds are normal. There is no tenderness.  Genitourinary: Rectal exam shows guaiac negative stool.  Genitourinary Comments: Rectal exam perianal area with some skin tags no external hemorrhoids.  No prolapsed internal hemorrhoids.  No tenderness to palpation in the buttocks area or perianal area.  But with spreading of the cheeks there is some discomfort and there is evidence of a anal fissure posteriorly.  No active bleeding.  Rectal exam uncomfortable but no rectal mass and heme-negative.  Musculoskeletal: Normal range of motion.  Neurological: He is alert and oriented to person, place, and time. No cranial nerve deficit or sensory deficit. He exhibits normal muscle tone. Coordination normal.  Skin: Skin is warm.  Nursing note and vitals reviewed.    ED Treatments / Results  Labs (all labs ordered are listed, but only abnormal results are displayed) Labs Reviewed  COMPREHENSIVE METABOLIC PANEL - Abnormal; Notable for the following components:      Result Value   BUN 30 (*)    Total Protein 6.3 (*)    All other components within normal limits  CBC WITH DIFFERENTIAL/PLATELET - Abnormal; Notable for the following components:   Platelets 139 (*)    All other components within normal limits  OCCULT BLOOD X 1 CARD TO LAB, STOOL  LIPASE, BLOOD    EKG None  Radiology Ct Abdomen Pelvis W Contrast  Result Date: 12/16/2017 CLINICAL DATA:  Pt with rectal pain, hemorrhoid pain x 10 days, denies bleeding, complains of weakness/fatigue Hx Hiatal Hernia, esophageal stricture, appy EXAM: CT ABDOMEN AND PELVIS WITH CONTRAST TECHNIQUE: Multidetector CT imaging of the abdomen and pelvis was performed using the standard  protocol following bolus administration of intravenous contrast. CONTRAST:  166mL ISOVUE-300 IOPAMIDOL (ISOVUE-300) INJECTION 61% COMPARISON:  None. FINDINGS: Lower chest: Lung bases are clear. Hepatobiliary: Low-density lesion in the subcapsular LEFT hepatic lobe is consistent benign hepatic cyst (image 15/2). Similar lesion on image 19/2 LEFT hepatic lobe is too small characterize. No biliary duct dilatation. Gallbladder normal Pancreas: Pancreas is normal. No ductal dilatation. No pancreatic inflammation. Spleen: Normal spleen Adrenals/urinary tract: Adrenal glands normal. Large nonenhancing cysts of the RIGHT kidney measures 6.5 cm in diameter. No obstructive uropathy. Bladder normal Stomach/Bowel: Small hiatal hernia. Stomach, duodenum, small-bowel cecum normal. Post appendectomy. There multiple diverticular the descending colon without acute inflammation. Rectum normal. Vascular/Lymphatic: Abdominal aorta is normal caliber. No periportal or retroperitoneal adenopathy. No pelvic adenopathy. Reproductive: Prostate normal Other: No free fluid. Musculoskeletal: No aggressive osseous lesion. IMPRESSION: 1. No acute findings in the  abdomen pelvis. 2. Benign appearing cysts in the liver and kidney 3. LEFT colon diverticulosis without evidence acute diverticulitis Electronically Signed   By: Suzy Bouchard M.D.   On: 12/16/2017 17:30    Procedures Procedures (including critical care time)  Medications Ordered in ED Medications  0.9 %  sodium chloride infusion ( Intravenous New Bag/Given 12/16/17 1925)  iopamidol (ISOVUE-300) 61 % injection 100 mL (100 mLs Intravenous Contrast Given 12/16/17 1659)     Initial Impression / Assessment and Plan / ED Course  I have reviewed the triage vital signs and the nursing notes.  Pertinent labs & imaging results that were available during my care of the patient were reviewed by me and considered in my medical decision making (see chart for details).    Symptoms  probably related to the anal fissure.  History fits it.  But due to his age and the concerns from the urgent care CT scan abdomen was done.  Without any acute findings.  Labs without any significant findings.  Hemoccult was negative.  Patient will be treated with pain medicine and MiraLAX.  And follow-up with gastroenterology.   Final Clinical Impressions(s) / ED Diagnoses   Final diagnoses:  Anal fissure  Rectal pain    ED Discharge Orders        Ordered    HYDROcodone-acetaminophen (NORCO/VICODIN) 5-325 MG tablet  Every 6 hours PRN     12/16/17 1926    polyethylene glycol (MIRALAX) packet  Daily     12/16/17 1926       Fredia Sorrow, MD 12/16/17 1932

## 2017-12-16 NOTE — ED Notes (Signed)
ED Provider at bedside. 

## 2017-12-16 NOTE — Discharge Instructions (Signed)
°  Your exam and symptoms are concerning for a possible infection that cannot be seen on an external exam.   It is recommended you go to the hospital for further evaluation with labs and likely an imaging study such as an ultrasound or CT scan.  It is recommended you go to the hospital today to get this taken care of so you can get the proper treatment as soon as possible.

## 2017-12-18 ENCOUNTER — Telehealth: Payer: Self-pay

## 2017-12-18 DIAGNOSIS — K602 Anal fissure, unspecified: Secondary | ICD-10-CM

## 2017-12-18 NOTE — Telephone Encounter (Signed)
Referral to gastroenterology ordered.  Recommend pt follow up with me.

## 2017-12-18 NOTE — Telephone Encounter (Signed)
Left message advising of recommendations.  

## 2017-12-18 NOTE — Telephone Encounter (Signed)
Damaso was seen in the ED. The recommendations was to follow up with GI. He would like Dr Georgina Snell to order the referral. Please advise.   ED Assessment and Plan-   Symptoms probably related to the anal fissure.  History fits it.  But due to his age and the concerns from the urgent care CT scan abdomen was done.  Without any acute findings.  Labs without any significant findings.  Hemoccult was negative.  Patient will be treated with pain medicine and MiraLAX.  And follow-up with gastroenterology.

## 2018-02-12 ENCOUNTER — Ambulatory Visit (INDEPENDENT_AMBULATORY_CARE_PROVIDER_SITE_OTHER): Payer: Medicare Other | Admitting: Family Medicine

## 2018-02-12 ENCOUNTER — Encounter: Payer: Self-pay | Admitting: Family Medicine

## 2018-02-12 VITALS — BP 118/55 | HR 74 | Ht 70.0 in | Wt 169.0 lb

## 2018-02-12 DIAGNOSIS — K602 Anal fissure, unspecified: Secondary | ICD-10-CM | POA: Diagnosis not present

## 2018-02-12 DIAGNOSIS — F320 Major depressive disorder, single episode, mild: Secondary | ICD-10-CM

## 2018-02-12 DIAGNOSIS — F329 Major depressive disorder, single episode, unspecified: Secondary | ICD-10-CM | POA: Insufficient documentation

## 2018-02-12 MED ORDER — DILTIAZEM GEL 2 %: 1.0000 "application " | Freq: Two times a day (BID) | CUTANEOUS | 12 refills | Status: DC

## 2018-02-12 NOTE — Patient Instructions (Addendum)
Thank you for coming in today. Add diltiazem gel.  Continue other treatment including high fiber and miralax and sitz baths.  Follow up with Gastroenterology and make sure they send me reports.  Return soon if needed.   Consider therapy for depression symptoms.  I recommend CBT. LCSW.  Consider medication  Research zoloft or prozac.    Anal Fissure, Adult An anal fissure is a small tear or crack in the skin around the anus. Bleeding from a fissure usually stops on its own within a few minutes. However, bleeding will often occur again with each bowel movement until the crack heals. What are the causes? This condition may be caused by:  Passing large, hard stool (feces).  Frequent diarrhea.  Constipation.  Inflammatory bowel disease (Crohn disease or ulcerative colitis).  Infections.  Anal sex.  What are the signs or symptoms? Symptoms of this condition include:  Bleeding from the rectum.  Small amounts of blood seen on your stool, on toilet paper, or in the toilet after a bowel movement.  Painful bowel movements.  Itching or irritation around the anus.  How is this diagnosed? A health care provider may diagnose this condition by closely examining the anal area. An anal fissure can usually be seen with careful inspection. In some cases, a rectal exam may be performed, or a short tube (anoscope) may be used to examine the anal canal. How is this treated? Treatment for this condition may include:  Taking steps to avoid constipation. This may include making changes to your diet, such as increasing your intake of fiber or fluid.  Taking fiber supplements. These supplements can soften your stool to help make bowel movements easier. Your health care provider may also prescribe a stool softener if your stool is often hard.  Taking sitz baths. This may help to heal the tear.  Using medicated creams or ointments. These may be prescribed to lessen discomfort.  Follow these  instructions at home: Eating and drinking  Avoid foods that may be constipating, such as bananas and dairy products.  Drink enough fluid to keep your urine clear or pale yellow.  Maintain a diet that is high in fruits, whole grains, and vegetables. General instructions  Keep the anal area as clean and dry as possible.  Take sitz baths as told by your health care provider. Do not use soap in the sitz baths.  Take over-the-counter and prescription medicines only as told by your health care provider.  Use creams or ointments only as told by your health care provider.  Keep all follow-up visits as told by your health care provider. This is important. Contact a health care provider if:  You have more bleeding.  You have a fever.  You have diarrhea that is mixed with blood.  You continue to have pain.  Your problem is getting worse rather than better. This information is not intended to replace advice given to you by your health care provider. Make sure you discuss any questions you have with your health care provider. Document Released: 06/20/2005 Document Revised: 10/28/2015 Document Reviewed: 09/15/2014 Elsevier Interactive Patient Education  2018 [employer].   Sertraline tablets What is this medicine? SERTRALINE (SER tra leen) is used to treat depression. It may also be used to treat obsessive compulsive disorder, panic disorder, post-trauma stress, premenstrual dysphoric disorder (PMDD) or social anxiety. This medicine may be used for other purposes; ask your health care provider or pharmacist if you have questions. COMMON BRAND NAME(S): Zoloft What should  I tell my health care provider before I take this medicine? They need to know if you have any of these conditions: -bleeding disorders -bipolar disorder or a family history of bipolar disorder -glaucoma -heart disease -high blood pressure -history of irregular heartbeat -history of low levels of calcium, magnesium,  or potassium in the blood -if you often drink alcohol -liver disease -receiving electroconvulsive therapy -seizures -suicidal thoughts, plans, or attempt; a previous suicide attempt by you or a family member -take medicines that treat or prevent blood clots -thyroid disease -an unusual or allergic reaction to sertraline, other medicines, foods, dyes, or preservatives -pregnant or trying to get pregnant -breast-feeding How should I use this medicine? Take this medicine by mouth with a glass of water. Follow the directions on the prescription label. You can take it with or without food. Take your medicine at regular intervals. Do not take your medicine more often than directed. Do not stop taking this medicine suddenly except upon the advice of your doctor. Stopping this medicine too quickly may cause serious side effects or your condition may worsen. A special MedGuide will be given to you by the pharmacist with each prescription and refill. Be sure to read this information carefully each time. Talk to your pediatrician regarding the use of this medicine in children. While this drug may be prescribed for children as young as 7 years for selected conditions, precautions do apply. Overdosage: If you think you have taken too much of this medicine contact a poison control center or emergency room at once. NOTE: This medicine is only for you. Do not share this medicine with others. What if I miss a dose? If you miss a dose, take it as soon as you can. If it is almost time for your next dose, take only that dose. Do not take double or extra doses. What may interact with this medicine? Do not take this medicine with any of the following medications: -cisapride -dofetilide -dronedarone -linezolid -MAOIs like Carbex, Eldepryl, Marplan, Nardil, and Parnate -methylene blue (injected into a vein) -pimozide -thioridazine This medicine may also interact with the following  medications: -alcohol -amphetamines -aspirin and aspirin-like medicines -certain medicines for depression, anxiety, or psychotic disturbances -certain medicines for fungal infections like ketoconazole, fluconazole, posaconazole, and itraconazole -certain medicines for irregular heart beat like flecainide, quinidine, propafenone -certain medicines for migraine headaches like almotriptan, eletriptan, frovatriptan, naratriptan, rizatriptan, sumatriptan, zolmitriptan -certain medicines for sleep -certain medicines for seizures like carbamazepine, valproic acid, phenytoin -certain medicines that treat or prevent blood clots like warfarin, enoxaparin, dalteparin -cimetidine -digoxin -diuretics -fentanyl -isoniazid -lithium -NSAIDs, medicines for pain and inflammation, like ibuprofen or naproxen -other medicines that prolong the QT interval (cause an abnormal heart rhythm) -rasagiline -safinamide -supplements like St. John's wort, kava kava, valerian -tolbutamide -tramadol -tryptophan This list may not describe all possible interactions. Give your health care provider a list of all the medicines, herbs, non-prescription drugs, or dietary supplements you use. Also tell them if you smoke, drink alcohol, or use illegal drugs. Some items may interact with your medicine. What should I watch for while using this medicine? Tell your doctor if your symptoms do not get better or if they get worse. Visit your doctor or health care professional for regular checks on your progress. Because it may take several weeks to see the full effects of this medicine, it is important to continue your treatment as prescribed by your doctor. Patients and their families should watch out for new or worsening thoughts of  suicide or depression. Also watch out for sudden changes in feelings such as feeling anxious, agitated, panicky, irritable, hostile, aggressive, impulsive, severely restless, overly excited and hyperactive,  or not being able to sleep. If this happens, especially at the beginning of treatment or after a change in dose, call your health care professional. Dennis Bast may get drowsy or dizzy. Do not drive, use machinery, or do anything that needs mental alertness until you know how this medicine affects you. Do not stand or sit up quickly, especially if you are an older patient. This reduces the risk of dizzy or fainting spells. Alcohol may interfere with the effect of this medicine. Avoid alcoholic drinks. Your mouth may get dry. Chewing sugarless gum or sucking hard candy, and drinking plenty of water may help. Contact your doctor if the problem does not go away or is severe. What side effects may I notice from receiving this medicine? Side effects that you should report to your doctor or health care professional as soon as possible: -allergic reactions like skin rash, itching or hives, swelling of the face, lips, or tongue -anxious -black, tarry stools -changes in vision -confusion -elevated mood, decreased need for sleep, racing thoughts, impulsive behavior -eye pain -fast, irregular heartbeat -feeling faint or lightheaded, falls -feeling agitated, angry, or irritable -hallucination, loss of contact with reality -loss of balance or coordination -loss of memory -painful or prolonged erections -restlessness, pacing, inability to keep still -seizures -stiff muscles -suicidal thoughts or other mood changes -trouble sleeping -unusual bleeding or bruising -unusually weak or tired -vomiting Side effects that usually do not require medical attention (report to your doctor or health care professional if they continue or are bothersome): -change in appetite or weight -change in sex drive or performance -diarrhea -increased sweating -indigestion, nausea -tremors This list may not describe all possible side effects. Call your doctor for medical advice about side effects. You may report side effects to  FDA at 1-800-FDA-1088. Where should I keep my medicine? Keep out of the reach of children. Store at room temperature between 15 and 30 degrees C (59 and 86 degrees F). Throw away any unused medicine after the expiration date. NOTE: This sheet is a summary. It may not cover all possible information. If you have questions about this medicine, talk to your doctor, pharmacist, or health care provider.  2018 Elsevier/Gold Standard (2016-06-24 14:17:49)

## 2018-02-12 NOTE — Progress Notes (Signed)
Nicholas Murillo is a 72 y.o. male who presents to [facility] today for rectal fissure and depression symptoms.  Nicholas Murillo has been battling with a rectal fissure recently causing significant pain.  He was seen in the emergency room got quite bad on June 15.  He was symptomatic enough that a CT scan of the abdomen was performed which was largely unremarkable.  He was referred to gastroneurology and has a follow-up appoint with gastrology in about a week.  He notes his symptoms have waxed and waned but are bothering him again.  He denies significant blood in the stool but does note pain with bowel movements.  He has been eating a higher fiber diet and using MiraLAX which does help.  He has soft bowel movements that no longer very painful.  Additionally he is using sitz bath's 3 times daily.  He notes some depression symptoms.  His wife is currently battling peritoneal carcinomatosis and notes that he is having irritability and sleeping and lack of pleasure in normal activities.  He also notes that he is bothered by the current political climate and worried about [place] devolving into fascism.    ROS as above:  Exam:  BP (!) 118/55   Pulse 74   Ht 5\' 10"  (1.778 m)   Wt 169 lb (76.7 kg)   BMI 24.25 kg/m  Gen: Well NAD HEENT: EOMI,  MMM Lungs: Normal work of breathing. CTABL Heart: RRR no MRG Abd: NABS, Soft. Nondistended, Nontender Exts: Brisk capillary refill, warm and well perfused.  Anus: Small fissure at 6 position.  Tender to palpation no masses. Psych alert and oriented normal speech thought process and affect.  No SI or HI expressed.  Depression screen [facility] 2/9 02/12/2018 11/09/2017 11/09/2017 10/13/2015 10/01/2013  Decreased Interest 3 1 1  0 1  Down, Depressed, Hopeless 2 1 1  0 1  PHQ - 2 Score 5 2 2  0 2  Altered sleeping 3 0 0 - -  Tired, decreased energy 3 1 1  - 0    Change in appetite 0 0 0 - 0  Feeling bad or failure about yourself  0 0 0 - 0  Trouble concentrating 0 0 0 - 0  Moving slowly or fidgety/restless 0 0 0 - 0  Suicidal thoughts 0 0 0 - 0  PHQ-9 Score 11 3 3  - -  Difficult doing work/chores Somewhat difficult Not difficult at all Not difficult at all - -     Lab and Radiology Results   Chemistry      Component Value Date/Time   NA 139 12/16/2017 1602   K 4.5 12/16/2017 1602   CL 105 12/16/2017 1602   CO2 27 12/16/2017 1602   BUN 30 (H) 12/16/2017 1602   CREATININE 0.90 12/16/2017 1602   CREATININE 0.83 10/05/2017 1404   GLU 79 03/15/2011      Component Value Date/Time   CALCIUM 8.9 12/16/2017 1602   ALKPHOS 66 12/16/2017 1602   AST 23 12/16/2017 1602   ALT 19 12/16/2017 1602   BILITOT 0.9 12/16/2017 1602        Assessment and Plan: 72 y.o. male with  Rectal fissure: Continue current regimen add diltiazem gel.  Follow-up with gastroenterology and recheck with me in 1 month.  Mood: Worsening recently.  His current mood symptoms are not typical.  Discussed options.  Recommend counseling.  Patient will research sertraline.  Recheck in 1 month or sooner if  needed.  Consider medications if not improving.   No orders of the defined types were placed in this encounter.  Meds ordered this encounter  Medications  . diltiazem 2 % GEL    Sig: Apply 1 application topically 2 (two) times daily.    Dispense:  30 g    Refill:  12     Historical information moved to improve visibility of documentation.  Past Medical History:  Diagnosis Date  . Esophageal stricture   . Hiatal hernia   . Hyperlipidemia    Past Surgical History:  Procedure Laterality Date  . APPENDECTOMY  1960  . arterial catheter  1997   Social History   Tobacco Use  . Smoking status: Never Smoker  . Smokeless tobacco: Never Used  Substance Use Topics  . Alcohol use: No    Alcohol/week: 0.0 standard drinks    Comment: quit 20 years ago   family  history includes Colon polyps in his mother; Dementia in his mother; Skin cancer in his father.  Medications: Current Outpatient Medications  Medication Sig Dispense Refill  . aspirin EC 81 MG tablet Take 81 mg by mouth daily.    Marland Kitchen EPINEPHrine (EPIPEN 2-PAK) 0.3 mg/0.3 mL IJ SOAJ injection Inject 0.3 mLs (0.3 mg total) into the muscle once. 1 Device 0  . lisinopril (PRINIVIL,ZESTRIL) 10 MG tablet Take 1 tablet (10 mg total) by mouth daily. 90 tablet 1  . magnesium oxide (MAG-OX) 400 MG tablet Take 800 mg by mouth daily.    . polyethylene glycol (MIRALAX) packet Take 17 g by mouth daily. 14 each 0  . simvastatin (ZOCOR) 20 MG tablet Take 1 tablet (20 mg total) by mouth daily. 90 tablet 3  . diltiazem 2 % GEL Apply 1 application topically 2 (two) times daily. 30 g 12   No current facility-administered medications for this visit.    No Known Allergies   Discussed warning signs or symptoms. Please see discharge instructions. Patient expresses understanding.

## 2018-02-20 ENCOUNTER — Ambulatory Visit (INDEPENDENT_AMBULATORY_CARE_PROVIDER_SITE_OTHER): Payer: Medicare Other | Admitting: Internal Medicine

## 2018-02-20 ENCOUNTER — Encounter: Payer: Self-pay | Admitting: Internal Medicine

## 2018-02-20 VITALS — BP 112/68 | HR 64 | Ht 70.0 in | Wt 169.2 lb

## 2018-02-20 DIAGNOSIS — Z1211 Encounter for screening for malignant neoplasm of colon: Secondary | ICD-10-CM | POA: Diagnosis not present

## 2018-02-20 DIAGNOSIS — K222 Esophageal obstruction: Secondary | ICD-10-CM | POA: Diagnosis not present

## 2018-02-20 DIAGNOSIS — K2100 Gastro-esophageal reflux disease with esophagitis, without bleeding: Secondary | ICD-10-CM

## 2018-02-20 DIAGNOSIS — K602 Anal fissure, unspecified: Secondary | ICD-10-CM

## 2018-02-20 NOTE — Progress Notes (Signed)
HISTORY OF PRESENT ILLNESS:  Nicholas Murillo is a 72 y.o. male with GERD, complicated by peptic stricture who is sent today by his primary care provider Dr. Georgina Murillo regarding persistent symptomatic rectal fissure and the need for colonoscopy. The patient was last seen in this office January 2014 regarding intermittent solid food dysphagia and GERD. See that dictation for details. He subsequently underwent upper endoscopy with esophageal dilation ([address] [place] [address]) 09/03/2012. Examination revealed esophageal stricture with esophagitis. He has since been maintained on omeprazole 20 mg daily. Currently taking the over-the-counter version. He denies recurrent dysphagia or active reflux symptoms. The patient has been encouraged to undergo colon cancer screening but has not. He thinks he may have had a stool test done 6 or 7 years ago but is uncertain. In late May 2019 he developed rectal pain with bleeding after passing a hard stool. Thereafter severe pain with defecation. One was seen in the emergency room. I have reviewed that encounter. CT scan of the abdomen and pelvis with contrast was performed and revealed no acute abnormalities. Left-sided diverticulosis present. Review of outside blood work from that same date revealed normal CBC with hemoglobin 15.1. He was recently seen by his primary provider for follow-up of previously diagnosed anal fissure. Diltiazem gel 2% twice daily was prescribed. The patient has been on MiraLAX which has softened his stools. His symptoms are slowly improving though he still has some discomfort. Mother with colon polyps. No family history of colon cancer. He is interested in colonoscopy. His wife is battling ovarian cancer.  REVIEW OF SYSTEMS:  All non-GI ROS negative unless otherwise stated in the history of present illness except for muscle cramps  Past Medical History:  Diagnosis Date  . Esophageal stricture   . Hiatal hernia   . Hyperlipidemia     Past Surgical  History:  Procedure Laterality Date  . APPENDECTOMY  1960  . arterial catheter  1997    Social History Nicholas Murillo  reports that he has never smoked. He has never used smokeless tobacco. He reports that he does not drink alcohol or use drugs.  family history includes Colon polyps in his mother; Dementia in his mother; Skin cancer in his father.  No Known Allergies     PHYSICAL EXAMINATION: Vital signs: BP 112/68   Pulse 64   Ht 5\' 10"  (1.778 m)   Wt 169 lb 4 oz (76.8 kg)   BMI 24.28 kg/m   Constitutional: generally well-appearing, no acute distress Psychiatric: alert and oriented x3, cooperative Eyes: extraocular movements intact, anicteric, conjunctiva pink Mouth: oral pharynx moist, no lesions Neck: supple no lymphadenopathy Cardiovascular: heart regular rate and rhythm, no murmur Lungs: clear to auscultation bilaterally Abdomen: soft, nontender, nondistended, no obvious ascites, no peritoneal signs, normal bowel sounds, no organomegaly Rectal:posterior fissure which is tender Extremities: no clubbing, cyanosis, or lower extremity edema bilaterally Skin: no lesions on visible extremities Neuro: No focal deficits. Cranial nerves intact  ASSESSMENT:  #1. Posterior anal fissure. Active #2. Colon cancer screening. Appropriate candidate without contraindication. Motivated. #3. GERD complicated by esophagitis and peptic stricture. Asymptomatic post dilation on PPI   PLAN:  #1. Continue MiraLAX daily #2. Advised to use diltiazem gel 5 times daily for 6 weeks #3. Discussed the role of surgery, specifically lateral sphincterotomy, if he does not respond to medical management #4. Colonoscopy to provide colon cancer screening and rule out other causes for fissure such as Crohn's (less likely).The nature of the procedure, as well as the risks, benefits, and  alternatives were carefully and thoroughly reviewed with the patient. Ample time for discussion and questions allowed.  The patient understood, was satisfied, and agreed to proceed. #5. Reflux precautions #6. Continue PPI #7. Repeat upper endoscopy with esophageal dilation should he develop recurrent dysphagia. Advised #8. Ongoing general medical care with Dr. Georgina Murillo. Copy of this consult note has been sent to Dr. Georgina Murillo.  45 minutes spent face-to-face with the patient. Greater than 50% a time use for counseling regarding his symptomatic anal fissure. The need for colonoscopy. An importance of ongoing PPI therapy for complicated GERD

## 2018-02-20 NOTE — Patient Instructions (Signed)
Increase your diltiazem to five times a day  Call back when you are ready to schedule a colonsocopy

## 2018-03-12 ENCOUNTER — Ambulatory Visit (INDEPENDENT_AMBULATORY_CARE_PROVIDER_SITE_OTHER): Payer: Medicare Other | Admitting: Family Medicine

## 2018-03-12 ENCOUNTER — Encounter: Payer: Self-pay | Admitting: Family Medicine

## 2018-03-12 VITALS — BP 133/65 | HR 79 | Ht 70.0 in | Wt 172.0 lb

## 2018-03-12 DIAGNOSIS — Z23 Encounter for immunization: Secondary | ICD-10-CM

## 2018-03-12 DIAGNOSIS — T464X5A Adverse effect of angiotensin-converting-enzyme inhibitors, initial encounter: Secondary | ICD-10-CM | POA: Insufficient documentation

## 2018-03-12 DIAGNOSIS — F320 Major depressive disorder, single episode, mild: Secondary | ICD-10-CM | POA: Diagnosis not present

## 2018-03-12 DIAGNOSIS — R05 Cough: Secondary | ICD-10-CM

## 2018-03-12 DIAGNOSIS — R058 Other specified cough: Secondary | ICD-10-CM | POA: Insufficient documentation

## 2018-03-12 DIAGNOSIS — K602 Anal fissure, unspecified: Secondary | ICD-10-CM

## 2018-03-12 MED ORDER — BENZONATATE 200 MG PO CAPS: 200.0000 mg | ORAL_CAPSULE | Freq: Three times a day (TID) | ORAL | 1 refills | Status: DC | PRN

## 2018-03-12 MED ORDER — LOSARTAN POTASSIUM 50 MG PO TABS: 50.0000 mg | ORAL_TABLET | Freq: Every day | ORAL | 3 refills | Status: DC

## 2018-03-12 NOTE — Progress Notes (Signed)
Nicholas Murillo is a 72 y.o. male who presents to [facility] [place] today for depression, anal fissure.   Depression: He says that his depressive symptoms have improved since the last visit. He speaks as if he has done some self reflection and is feeling a lot better. His wife is battling cancer and he is very stressed and overwhelmed with taking care of her. He now seems to be in a better place mentally.   Fissure: he recently saw GI who recommended increasing diltiazem gel to 5x daily which he says is helping. He has not had a bad episode in over a month and says he now only has some "slight irritation."   Cough: He has had an on and off cough for a few weeks. He says that he has had this cough before and he cannot think of an obvious association. Of note, he started lisinopril about 1 year ago but says that he had the cough before starting the medicine.    ROS as above:  Exam:  BP 133/65   Pulse 79   Ht 5\' 10"  (1.778 m)   Wt 172 lb (78 kg)   BMI 24.68 kg/m  Wt Readings from Last 5 Encounters:  03/12/18 172 lb (78 kg)  02/20/18 169 lb 4 oz (76.8 kg)  02/12/18 169 lb (76.7 kg)  12/16/17 164 lb 14.5 oz (74.8 kg)  12/16/17 165 lb 4 oz (75 kg)    Gen: Well NAD HEENT: EOMI,  MMM. Non erythematous pharynx with some post nasal discharge  Lungs: Normal work of breathing. CTABL Heart: RRR no MRG Abd: NABS, Soft. Nondistended, Nontender Exts: Brisk capillary refill, warm and well perfused.  Psych alert and oriented normal speech thought process and affect. Depression screen [facility] 2/9 03/12/2018 02/12/2018 11/09/2017 11/09/2017 10/13/2015  Decreased Interest 1 3 1 1  0  Down, Depressed, Hopeless 1 2 1 1  0  PHQ - 2 Score 2 5 2 2  0  Altered sleeping 1 3 0 0 -  Tired, decreased energy 0 3 1 1  -  Change in appetite 0 0 0 0 -  Feeling bad or failure about yourself  0 0 0 0 -  Trouble  concentrating 0 0 0 0 -  Moving slowly or fidgety/restless 0 0 0 0 -  Suicidal thoughts 0 0 0 0 -  PHQ-9 Score 3 11 3 3  -  Difficult doing work/chores Not difficult at all Somewhat difficult Not difficult at all Not difficult at all -   GAD 7 : Generalized Anxiety Score 03/12/2018  Nervous, Anxious, on Edge 1  Control/stop worrying 0  Worry too much - different things 1  Trouble relaxing 0  Restless 1  Easily annoyed or irritable 0  Afraid - awful might happen 0  Total GAD 7 Score 3  Anxiety Difficulty Not difficult at all       Assessment and Plan: 72 y.o. male with depression, fissure cough.   Depression: his depression symptoms have significantly improved without medication. We talked at length about the benefits of self care and reflection and seems to be doing very well. He will return to see me if his symptoms return or worsen.   Fissure: his fissure symptoms are also resolving. He is doing well with the diltiazem gel 5x daily and has not had any major pain. He should continue with his current regimen and continue to follow up with GI.   Cough: This cough  could be a side effect of lisinopril. However post nasal drip and seasonal allergies could also be contributing. The plan will be to d/c his lisinopril and start an equivalent dose of losartan. We will continue to monitor this and consider intranasal sprays in the future if no resolving.   Influenza vaccine given today    Orders Placed This Encounter  Procedures  . Flu Vaccine QUAD 36+ mos IM   Meds ordered this encounter  Medications  . losartan (COZAAR) 50 MG tablet    Sig: Take 1 tablet (50 mg total) by mouth daily.    Dispense:  90 tablet    Refill:  3    Replaces lisinopril  . benzonatate (TESSALON) 200 MG capsule    Sig: Take 1 capsule (200 mg total) by mouth 3 (three) times daily as needed for cough.    Dispense:  90 capsule    Refill:  1     Historical information moved to improve visibility of  documentation.  Past Medical History:  Diagnosis Date  . Esophageal stricture   . Hiatal hernia   . Hyperlipidemia    Past Surgical History:  Procedure Laterality Date  . APPENDECTOMY  1960  . arterial catheter  1997   Social History   Tobacco Use  . Smoking status: Never Smoker  . Smokeless tobacco: Never Used  Substance Use Topics  . Alcohol use: No    Alcohol/week: 0.0 standard drinks    Comment: quit 20 years ago   family history includes Colon polyps in his mother; Dementia in his mother; Skin cancer in his father.  Medications: Current Outpatient Medications  Medication Sig Dispense Refill  . aspirin EC 81 MG tablet Take 81 mg by mouth daily.    Marland Kitchen diltiazem 2 % GEL Apply 1 application topically 2 (two) times daily. 30 g 12  . EPINEPHrine (EPIPEN 2-PAK) 0.3 mg/0.3 mL IJ SOAJ injection Inject 0.3 mLs (0.3 mg total) into the muscle once. 1 Device 0  . magnesium oxide (MAG-OX) 400 MG tablet Take 800 mg by mouth daily.    . polyethylene glycol (MIRALAX) packet Take 17 g by mouth daily. 14 each 0  . simvastatin (ZOCOR) 20 MG tablet Take 1 tablet (20 mg total) by mouth daily. 90 tablet 3  . benzonatate (TESSALON) 200 MG capsule Take 1 capsule (200 mg total) by mouth 3 (three) times daily as needed for cough. 90 capsule 1  . losartan (COZAAR) 50 MG tablet Take 1 tablet (50 mg total) by mouth daily. 90 tablet 3   No current facility-administered medications for this visit.    No Known Allergies   Discussed warning signs or symptoms. Please see discharge instructions. Patient expresses understanding.

## 2018-03-12 NOTE — Patient Instructions (Addendum)
Thank you for coming in today. Continue self care.  Read about self guided CBT (cognative behavioral therapy) Consider formal therapy.  If not improveing or worsening let me know.  We can help with medicine if needed.   Recheck as needed.    Stop Lisinopril and start Losartan.  Keep track of blood pressure and let me know a few measurements in a few weeks.   Use tessalon pearles as needed for cough.

## 2018-03-13 DIAGNOSIS — Z23 Encounter for immunization: Secondary | ICD-10-CM

## 2018-05-22 ENCOUNTER — Telehealth: Payer: Self-pay | Admitting: Internal Medicine

## 2018-05-22 NOTE — Telephone Encounter (Signed)
Left message for pt to call back  °

## 2018-05-23 NOTE — Telephone Encounter (Signed)
Ok for direct colon in [place]

## 2018-05-23 NOTE — Telephone Encounter (Signed)
Pt called and states his fissure is much better but there is still some irritation. Pt wants to know if Dr. Henrene Pastor thinks he is ok to go ahead and schedule the colon or does he need to be seen. Please advise.

## 2018-05-24 NOTE — Telephone Encounter (Signed)
Pt notified to call and schedule colon via mychart message.

## 2018-05-30 ENCOUNTER — Encounter: Payer: Self-pay | Admitting: Internal Medicine

## 2018-06-20 NOTE — Progress Notes (Signed)
Subjective:   Nicholas Murillo is a 72 y.o. male who presents for Medicare Annual/Subsequent preventive examination.  Review of Systems:  No ROS.  Medicare Wellness Visit. Additional risk factors are reflected in the social history.  Cardiac Risk Factors include: advanced age (>50men, >32 women);male gender;dyslipidemia;hypertension  Sleep patterns:  Getting about 8 hours of sleep a night. Wakes up 1 time during the night to go to the bathroom. Wakes up feeling rested. Home Safety/Smoke Alarms: Feels safe in home. Smoke alarms in place.  Living environment Lives with significant other in a 2 story home. Steps in the home have hand rails in place. Shower  Is a step over tub and no grab bars in place. Seat Belt Safety/Bike Helmet: Wears seat belt.    Male:   [facility]-  Has colonoscopy scheduled with Dr. Henrene Murillo on January 6th    PSA- utd Lab Results  Component Value Date   PSA 3.69 08/10/2017   PSA 5.43 03/17/2017   PSA 4.96 (H) 10/19/2015        Objective:    Vitals: BP 140/78 (BP Location: Left Arm, Patient Position: Sitting, Cuff Size: Normal)   Pulse 73   Ht 5\' 10"  (1.778 m)   Wt 175 lb (79.4 kg)   SpO2 99%   BMI 25.11 kg/m   Body mass index is 25.11 kg/m.  Advanced Directives 07/02/2018 12/16/2017 06/22/2015  Does Patient Have a Medical Advance Directive? No No No  Would patient like information on creating a medical advance directive? No - Patient declined No - Patient declined -    Tobacco Social History   Tobacco Use  Smoking Status Never Smoker  Smokeless Tobacco Never Used     Counseling given: Not Answered   Clinical Intake:  Pre-visit preparation completed: Yes  Pain : No/denies pain     Nutritional Risks: None Diabetes: No  How often do you need to have someone help you when you read instructions, pamphlets, or other written materials from your doctor or pharmacy?: 1 - Never What is the last grade level you completed in school?: 19  Interpreter  Needed?: No  Information entered by :: Nicholas Dakin, LPN  Past Medical History:  Diagnosis Date  . Arthritis   . Esophageal stricture   . GERD (gastroesophageal reflux disease)   . Hiatal hernia   . Hyperlipidemia   . Hypertension    Past Surgical History:  Procedure Laterality Date  . APPENDECTOMY  1960  . arterial catheter  1997  . UPPER GASTROINTESTINAL ENDOSCOPY     Family History  Problem Relation Age of Onset  . Skin cancer Father   . Colon polyps Mother        benign  . Dementia Mother   . Colon cancer Neg Hx   . Stomach cancer Neg Hx   . Esophageal cancer Neg Hx   . Rectal cancer Neg Hx    Social History   Socioeconomic History  . Marital status: Not on file    Spouse name: Nicholas Murillo  . Number of children: 2  . Years of education: 4  . Highest education level: Master's degree (e.g., MA, MS, MEng, MEd, MSW, MBA)  Occupational History  . Occupation: semi-retired   . Occupation: [name]  Social Needs  . Financial resource strain: Not hard at all  . Food insecurity:    Worry: Never true    Inability: Never true  . Transportation needs:    Medical: No    Non-medical: No  Tobacco Use  . Smoking status: Never Smoker  . Smokeless tobacco: Never Used  Substance and Sexual Activity  . Alcohol use: No    Alcohol/week: 0.0 standard drinks    Comment: quit 20 years ago  . Drug use: No    Comment: quit 1994 (cocaine,pot,"anything could get hands on")  . Sexual activity: Yes  Lifestyle  . Physical activity:    Days per week: 6 days    Minutes per session: 20 min  . Stress: Not at all  Relationships  . Social connections:    Talks on phone: Three times a week    Gets together: Never    Attends religious service: Never    Active member of club or organization: No    Attends meetings of clubs or organizations: Never    Relationship status: Living with partner  Other Topics Concern  . Not on file  Social History Narrative   Works part time at [employer]  just to keep his self social. 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 Drinks 2 cupsa of caffeine during the day    Outpatient Encounter Medications as of 07/02/2018  Medication Sig  . aspirin EC 81 MG tablet Take 81 mg by mouth daily.  Marland Kitchen diltiazem 2 % GEL Apply 1 application topically 2 (two) times daily.  Marland Kitchen EPINEPHrine (EPIPEN 2-PAK) 0.3 mg/0.3 mL IJ SOAJ injection Inject 0.3 mLs (0.3 mg total) into the muscle once.  Marland Kitchen losartan (COZAAR) 50 MG tablet Take 1 tablet (50 mg total) by mouth daily.  . magnesium oxide (MAG-OX) 400 MG tablet Take 800 mg by mouth daily.  . polyethylene glycol (MIRALAX) packet Take 17 g by mouth daily.  . simvastatin (ZOCOR) 20 MG tablet Take 1 tablet (20 mg total) by mouth daily.  . [DISCONTINUED] benzonatate (TESSALON) 200 MG capsule Take 1 capsule (200 mg total) by mouth 3 (three) times daily as needed for cough. (Patient not taking: Reported on 06/25/2018)   No facility-administered encounter medications on file as of 07/02/2018.     Activities of Daily Living In your present state of health, do you have any difficulty performing the following activities: 07/02/2018  Hearing? 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 N  Difficulty concentrating or making decisions? N  Walking or climbing stairs? N  Dressing or bathing? N  Doing errands, shopping? N  Preparing Food and eating ? N  Using the Toilet? N  In the past six months, have you accidently leaked urine? N  Do you have problems with loss of bowel control? N  Managing your Medications? N  Managing your Finances? N  Housekeeping or managing your Housekeeping? N  Some recent data might be hidden    Patient Care Team: Nicholas Hams, MD as PCP - General (Family Medicine)   Assessment:   This is a routine wellness examination for Nicholas Murillo.Physical assessment deferred to PCP. Patient reads in the morning and reads at  night. Still continues to work part time for [employer]    Exercise Activities and Dietary recommendations Current Exercise Habits: The patient does not participate in regular exercise at present;The patient has a physically strenuous job, but has no regular exercise apart from work., Exercise limited by: None identified Diet Tries to eat healthy. Eats out one time a week. Breakfast: cereal and a yogurt with fruit juice Lunch: [place] sandwich Dinner:  Nut, raisons mostly snacks  at night     Goals    . DIET - INCREASE WATER INTAKE     Wants to increase water intake up to the 64 ounces daily       Fall Risk Fall Risk  07/02/2018 02/12/2018 11/09/2017 03/01/2016 10/01/2013  Falls in the past year? 0 No No No No  Comment - - - Emmi Telephone Survey: data to providers prior to load -  Follow up Falls prevention discussed - - - -   Is the patient's home free of loose throw rugs in walkways, pet beds, electrical cords, etc?   yes      Grab bars in the bathroom? no      Handrails on the stairs?   yes      Adequate lighting?   yes  Depression Screen PHQ 2/9 Scores 07/02/2018 03/12/2018 02/12/2018 11/09/2017  PHQ - 2 Score 0 2 5 2   PHQ- 9 Score - 3 11 3     Cognitive Function     6CIT Screen 07/02/2018  What Year? 0 points  What month? 0 points  What time? 0 points  Count back from 20 0 points  Months in reverse 0 points  Repeat phrase 2 points  Total Score 2    Immunization History  Administered Date(s) Administered  . Influenza,inj,Quad PF,6+ Mos 03/13/2018  . Pneumococcal Conjugate-13 09/06/2016  . Pneumococcal Polysaccharide-23 07/18/2012  . Tdap 02/07/2013  . Zoster 09/03/2012    Screening Tests Health Maintenance  Topic Date Due  . Fecal DNA (Cologuard)  10/27/2018  . TETANUS/TDAP  02/08/2023  . INFLUENZA VACCINE  Completed  . Hepatitis C Screening  Completed  . PNA vac Low Risk Adult  Completed        Plan:    Mr. Asche , Thank you for taking time to come  for your Medicare Wellness Visit. I appreciate your ongoing commitment to your health goals. Please review the following plan we discussed and let me know if I can assist you in the future.   Please schedule your next medicare wellness visit with me in 1 yr. Continue doing brain stimulating activities (puzzles, reading, adult coloring books, staying active) to keep memory sharp.    These are the goals we discussed: Goals    . DIET - INCREASE WATER INTAKE     Wants to increase water intake up to the 64 ounces daily       This is a list of the screening recommended for you and due dates:  Health Maintenance  Topic Date Due  . Cologuard (Stool DNA test)  10/27/2018  . Tetanus Vaccine  02/08/2023  . Flu Shot  Completed  .  Hepatitis C: One time screening is recommended by Center for Disease Control  (CDC) for  adults born from [birth year] through 1965.   Completed  . Pneumonia vaccines  Completed     I have personally reviewed and noted the following in the patient's chart:   . Medical and social history . Use of alcohol, tobacco or illicit drugs  . Current medications and supplements . Functional ability and status . Nutritional status . Physical activity . Advanced directives . List of other physicians . Hospitalizations, surgeries, and ER visits in previous 12 months . Vitals . Screenings to include cognitive, depression, and falls . Referrals and appointments  In addition, I have reviewed and discussed with patient certain preventive protocols, quality metrics, and best practice recommendations. A written personalized care plan for preventive services as well as  general preventive health recommendations were provided to patient.     Joanne Chars, LPN  [id number]

## 2018-06-25 ENCOUNTER — Ambulatory Visit (AMBULATORY_SURGERY_CENTER): Payer: Self-pay | Admitting: *Deleted

## 2018-06-25 ENCOUNTER — Other Ambulatory Visit: Payer: Self-pay

## 2018-06-25 VITALS — Ht 70.0 in | Wt 176.0 lb

## 2018-06-25 DIAGNOSIS — Z1211 Encounter for screening for malignant neoplasm of colon: Secondary | ICD-10-CM

## 2018-06-25 MED ORDER — PLENVU 140 G PO SOLR: 1.0000 | Freq: Once | ORAL | 0 refills | Status: AC

## 2018-06-25 NOTE — Progress Notes (Signed)
No egg or soy allergy known to patient  No issues with past sedation with any surgeries  or procedures, no intubation problems  No diet pills per patient No home 02 use per patient  No blood thinners per patient  Pt denies issues with constipation  No A fib or A flutter  EMMI video sent to pt's e mail  

## 2018-07-02 ENCOUNTER — Ambulatory Visit (INDEPENDENT_AMBULATORY_CARE_PROVIDER_SITE_OTHER): Payer: Medicare Other | Admitting: *Deleted

## 2018-07-02 VITALS — BP 140/78 | HR 73 | Ht 70.0 in | Wt 175.0 lb

## 2018-07-02 DIAGNOSIS — Z0000 Encounter for general adult medical examination without abnormal findings: Secondary | ICD-10-CM | POA: Diagnosis not present

## 2018-07-02 NOTE — Patient Instructions (Signed)
Nicholas Murillo , Thank you for taking time to come for your Medicare Wellness Visit. I appreciate your ongoing commitment to your health goals. Please review the following plan we discussed and let me know if I can assist you in the future.   Please schedule your next medicare wellness visit with me in 1 yr. Continue doing brain stimulating activities (puzzles, reading, adult coloring books, staying active) to keep memory sharp.  These are the goals we discussed: Goals    . DIET - INCREASE WATER INTAKE     Wants to increase water intake up to the 64 ounces daily       Health Maintenance After Age 46 After age 5, you are at a higher risk for certain long-term diseases and infections as well as injuries from falls. Falls are a major cause of broken bones and head injuries in people who are older than age 21. Getting regular preventive care can help to keep you healthy and well. Preventive care includes getting regular testing and making lifestyle changes as recommended by your health care provider. Talk with your health care provider about:  Which screenings and tests you should have. A screening is a test that checks for a disease when you have no symptoms.  A diet and exercise plan that is right for you. What should I know about screenings and tests to prevent falls? Screening and testing are the best ways to find a health problem early. Early diagnosis and treatment give you the best chance of managing medical conditions that are common after age 9. Certain conditions and lifestyle choices may make you more likely to have a fall. Your health care provider may recommend:  Regular vision checks. Poor vision and conditions such as cataracts can make you more likely to have a fall. If you wear glasses, make sure to get your prescription updated if your vision changes.  Medicine review. Work with your health care provider to regularly review all of the medicines you are taking, including  over-the-counter medicines. Ask your health care provider about any side effects that may make you more likely to have a fall. Tell your health care provider if any medicines that you take make you feel dizzy or sleepy.  Osteoporosis screening. Osteoporosis is a condition that causes the bones to get weaker. This can make the bones weak and cause them to break more easily.  Blood pressure screening. Blood pressure changes and medicines to control blood pressure can make you feel dizzy.  Strength and balance checks. Your health care provider may recommend certain tests to check your strength and balance while standing, walking, or changing positions.  Foot health exam. Foot pain and numbness, as well as not wearing proper footwear, can make you more likely to have a fall.  Depression screening. You may be more likely to have a fall if you have a fear of falling, feel emotionally low, or feel unable to do activities that you used to do.  Alcohol use screening. Using too much alcohol can affect your balance and may make you more likely to have a fall. What actions can I take to lower my risk of falls? General instructions  Talk with your health care provider about your risks for falling. Tell your health care provider if: ? You fall. Be sure to tell your health care provider about all falls, even ones that seem minor. ? You feel dizzy, sleepy, or off-balance.  Take over-the-counter and prescription medicines only as told by your  health care provider. These include any supplements.  Eat a healthy diet and maintain a healthy weight. A healthy diet includes low-fat dairy products, low-fat (lean) meats, and fiber from whole grains, beans, and lots of fruits and vegetables. Home safety  Remove any tripping hazards, such as rugs, cords, and clutter.  Install safety equipment such as grab bars in bathrooms and safety rails on stairs.  Keep rooms and walkways well-lit. Activity   Follow a  regular exercise program to stay fit. This will help you maintain your balance. Ask your health care provider what types of exercise are appropriate for you.  If you need a cane or walker, use it as recommended by your health care provider.  Wear supportive shoes that have nonskid soles. Lifestyle  Do not drink alcohol if your health care provider tells you not to drink.  If you drink alcohol, limit how much you have: ? 0-1 drink a day for women. ? 0-2 drinks a day for men.  Be aware of how much alcohol is in your drink. In the [place] one drink equals one typical bottle of beer (12 oz), one-half glass of wine (5 oz), or one shot of hard liquor (1 oz).  Do not use any products that contain nicotine or tobacco, such as cigarettes and e-cigarettes. If you need help quitting, ask your health care provider. Summary  Having a healthy lifestyle and getting preventive care can help to protect your health and wellness after age 79.  Screening and testing are the best way to find a health problem early and help you avoid having a fall. Early diagnosis and treatment give you the best chance for managing medical conditions that are more common for people who are older than age 80.  Falls are a major cause of broken bones and head injuries in people who are older than age 10. Take precautions to prevent a fall at home.  Work with your health care provider to learn what changes you can make to improve your health and wellness and to prevent falls. This information is not intended to replace advice given to you by your health care provider. Make sure you discuss any questions you have with your health care provider. Document Released: 05/03/2017 Document Revised: 05/03/2017 Document Reviewed: 05/03/2017 Elsevier Interactive Patient Education  2019 [employer].

## 2018-07-09 ENCOUNTER — Encounter: Payer: Medicare Other | Admitting: Internal Medicine

## 2018-07-13 ENCOUNTER — Telehealth: Payer: Self-pay | Admitting: Internal Medicine

## 2018-07-13 NOTE — Telephone Encounter (Signed)
Spoke with patient, answered his prep questions. Explained his new prep times. patient verbalizes understanding. No further questions at this time.

## 2018-07-13 NOTE — Telephone Encounter (Signed)
Pt is returning your call

## 2018-07-13 NOTE — Telephone Encounter (Signed)
Pt call with some questions for the nurse about pre op instructions

## 2018-07-13 NOTE — Telephone Encounter (Signed)
Called patient. No answer,left message for him to call me back.  

## 2018-07-16 ENCOUNTER — Encounter: Payer: Self-pay | Admitting: Internal Medicine

## 2018-07-16 ENCOUNTER — Ambulatory Visit (AMBULATORY_SURGERY_CENTER): Payer: Medicare Other | Admitting: Internal Medicine

## 2018-07-16 VITALS — BP 96/44 | HR 60 | Temp 97.8°F | Resp 10 | Ht 70.0 in | Wt 172.0 lb

## 2018-07-16 DIAGNOSIS — Z1211 Encounter for screening for malignant neoplasm of colon: Secondary | ICD-10-CM

## 2018-07-16 DIAGNOSIS — D122 Benign neoplasm of ascending colon: Secondary | ICD-10-CM

## 2018-07-16 DIAGNOSIS — K219 Gastro-esophageal reflux disease without esophagitis: Secondary | ICD-10-CM | POA: Diagnosis not present

## 2018-07-16 DIAGNOSIS — I10 Essential (primary) hypertension: Secondary | ICD-10-CM | POA: Diagnosis not present

## 2018-07-16 DIAGNOSIS — D120 Benign neoplasm of cecum: Secondary | ICD-10-CM

## 2018-07-16 DIAGNOSIS — D123 Benign neoplasm of transverse colon: Secondary | ICD-10-CM

## 2018-07-16 HISTORY — PX: COLONOSCOPY: SHX174

## 2018-07-16 MED ORDER — SODIUM CHLORIDE 0.9 % IV SOLN: 500.0000 mL | Freq: Once | INTRAVENOUS | Status: DC

## 2018-07-16 NOTE — Patient Instructions (Signed)
Impression/Recommendations:  Polyp handout given to patient. Hemorrhoid handout given to patient. Diverticulosis handout given to patient.  Repeat colonoscopy in 3-5 year for surveillance.  Resume previous diet. Continue present medications.  Await pathology results.  YOU HAD AN ENDOSCOPIC PROCEDURE TODAY AT [facility]:   Refer to the procedure report that was given to you for any specific questions about what was found during the examination.  If the procedure report does not answer your questions, please call your gastroenterologist to clarify.  If you requested that your care partner not be given the details of your procedure findings, then the procedure report has been included in a sealed envelope for you to review at your convenience later.  YOU SHOULD EXPECT: Some feelings of bloating in the abdomen. Passage of more gas than usual.  Walking can help get rid of the air that was put into your GI tract during the procedure and reduce the bloating. If you had a lower endoscopy (such as a colonoscopy or flexible sigmoidoscopy) you may notice spotting of blood in your stool or on the toilet paper. If you underwent a bowel prep for your procedure, you may not have a normal bowel movement for a few days.  Please Note:  You might notice some irritation and congestion in your nose or some drainage.  This is from the oxygen used during your procedure.  There is no need for concern and it should clear up in a day or so.  SYMPTOMS TO REPORT IMMEDIATELY:   Following lower endoscopy (colonoscopy or flexible sigmoidoscopy):  Excessive amounts of blood in the stool  Significant tenderness or worsening of abdominal pains  Swelling of the abdomen that is new, acute  Fever of 100F or higher For urgent or emergent issues, a gastroenterologist can be reached at any hour by calling [phone].   DIET:  We do recommend a small meal at first, but then you may proceed to your  regular diet.  Drink plenty of fluids but you should avoid alcoholic beverages for 24 hours.  ACTIVITY:  You should plan to take it easy for the rest of today and you should NOT DRIVE or use heavy machinery until tomorrow (because of the sedation medicines used during the test).    FOLLOW UP: Our staff will call the number listed on your records the next business day following your procedure to check on you and address any questions or concerns that you may have regarding the information given to you following your procedure. If we do not reach you, we will leave a message.  However, if you are feeling well and you are not experiencing any problems, there is no need to return our call.  We will assume that you have returned to your regular daily activities without incident.  If any biopsies were taken you will be contacted by phone or by letter within the next 1-3 weeks.  Please call us at [phone] if you have not heard about the biopsies in 3 weeks.    SIGNATURES/CONFIDENTIALITY: You and/or your care partner have signed paperwork which will be entered into your electronic medical record.  These signatures attest to the fact that that the information above on your After Visit Summary has been reviewed and is understood.  Full responsibility of the confidentiality of this discharge information lies with you and/or your care-partner.

## 2018-07-16 NOTE — Op Note (Signed)
[place] [facility] Patient Name: Nicholas Murillo Procedure Date: 07/16/2018 1:03 PM MRN: [id number] Endoscopist: Docia Chuck. Henrene Pastor , MD Age: 73 Referring MD:  Date of Birth: 23-May-1946 Gender: Male Account #: [id number] Procedure:                Colonoscopy with cold snare polypectomy x 5 Indications:              Screening for colorectal malignant neoplasm Medicines:                Monitored Anesthesia Care Procedure:                Pre-Anesthesia Assessment:                           - Prior to the procedure, a History and Physical                            was performed, and patient medications and                            allergies were reviewed. The patient's tolerance of                            previous anesthesia was also reviewed. The risks                            and benefits of the procedure and the sedation                            options and risks were discussed with the patient.                            All questions were answered, and informed consent                            was obtained. Prior Anticoagulants: The patient has                            taken no previous anticoagulant or antiplatelet                            agents. ASA Grade Assessment: II - A patient with                            mild systemic disease. After reviewing the risks                            and benefits, the patient was deemed in                            satisfactory condition to undergo the procedure.                           After obtaining informed consent, the colonoscope  was passed under direct vision. Throughout the                            procedure, the patient's blood pressure, pulse, and                            oxygen saturations were monitored continuously. The                            Model CF-HQ190L [id number]) scope was introduced                            through the anus and advanced to the the cecum,              identified by appendiceal orifice and ileocecal                            valve. The ileocecal valve, appendiceal orifice,                            and rectum were photographed. The quality of the                            bowel preparation was good. The colonoscopy was                            performed without difficulty. The patient tolerated                            the procedure well. The bowel preparation used was                            SUPREP. Scope In: 1:24:27 PM Scope Out: 1:46:43 PM Scope Withdrawal Time: 0 hours 19 minutes 55 seconds  Total Procedure Duration: 0 hours 22 minutes 16 seconds  Findings:                 Five polyps were found in the transverse colon,                            ascending colon and cecum. The polyps were 2 to 4                            mm in size. These polyps were removed with a cold                            snare. Resection and retrieval were complete.                           Multiple small and large-mouthed diverticula were                            found in the left colon.  Internal hemorrhoids were found during                            retroflexion. The hemorrhoids were small.                           The exam was otherwise without abnormality on                            direct and retroflexion views. Complications:            No immediate complications. Estimated blood loss:                            None. Estimated Blood Loss:     Estimated blood loss: none. Impression:               - Five 2 to 4 mm polyps in the transverse colon, in                            the ascending colon and in the cecum, removed with                            a cold snare. Resected and retrieved.                           - Diverticulosis in the left colon.                           - Internal hemorrhoids.                           - The examination was otherwise normal on direct                             and retroflexion views. Recommendation:           - Repeat colonoscopy in 3 - 5 years for                            surveillance.                           - Patient has a contact number available for                            emergencies. The signs and symptoms of potential                            delayed complications were discussed with the                            patient. Return to normal activities tomorrow.                            Written discharge instructions were provided to the  patient.                           - Resume previous diet.                           - Continue present medications.                           - Await pathology results. Docia Chuck. Henrene Pastor, MD 07/16/2018 1:54:19 PM This report has been signed electronically.

## 2018-07-16 NOTE — Progress Notes (Signed)
Called to room to assist during endoscopic procedure.  Patient ID and intended procedure confirmed with present staff. Received instructions for my participation in the procedure from the performing physician.  

## 2018-07-16 NOTE — Progress Notes (Signed)
PT taken to PACU. Monitors in place. VSS. Report given to RN. 

## 2018-07-16 NOTE — Progress Notes (Signed)
Pt's states no medical or surgical changes since previsit or office visit. 

## 2018-07-17 ENCOUNTER — Telehealth: Payer: Self-pay | Admitting: *Deleted

## 2018-07-17 ENCOUNTER — Telehealth: Payer: Self-pay

## 2018-07-17 NOTE — Telephone Encounter (Signed)
  Follow up Call-  Call back number 07/16/2018  Post procedure Call Back phone  # [phone]  Permission to leave phone message Yes  Some recent data might be hidden     Patient questions:  Do you have a fever, pain , or abdominal swelling? No. Pain Score  0 *  Have you tolerated food without any problems? Yes.    Have you been able to return to your normal activities? Yes.    Do you have any questions about your discharge instructions: Diet   No. Medications  No. Follow up visit  No.  Do you have questions or concerns about your Care? No.  Actions: * If pain score is 4 or above: No action needed, pain <4.

## 2018-07-17 NOTE — Telephone Encounter (Signed)
First follow up call attempt.  Reached voicemail with number identified.  Message left to call if any questions or concerns.

## 2018-07-17 NOTE — Telephone Encounter (Deleted)
  Follow up Call-  Call back number 07/16/2018  Post procedure Call Back phone  # [phone]  Permission to leave phone message Yes  Some recent data might be hidden     Patient questions:  Do you have a fever, pain , or abdominal swelling? {yes no:314532} Pain Score  {NUMBERS; 0-10:5044} *  Have you tolerated food without any problems? {yes no:314532}  Have you been able to return to your normal activities? {yes no:314532}  Do you have any questions about your discharge instructions: Diet   {yes no:314532} Medications  {yes no:314532} Follow up visit  {yes no:314532}  Do you have questions or concerns about your Care? {yes no:314532}  Actions: * If pain score is 4 or above: {ACTION; [facility] ENDO PAIN >4:21563::"No action needed, pain <4."}

## 2018-07-20 ENCOUNTER — Encounter: Payer: Self-pay | Admitting: Internal Medicine

## 2018-08-27 DIAGNOSIS — R972 Elevated prostate specific antigen [PSA]: Secondary | ICD-10-CM | POA: Diagnosis not present

## 2018-09-03 DIAGNOSIS — R972 Elevated prostate specific antigen [PSA]: Secondary | ICD-10-CM | POA: Diagnosis not present

## 2018-09-03 DIAGNOSIS — N400 Enlarged prostate without lower urinary tract symptoms: Secondary | ICD-10-CM | POA: Diagnosis not present

## 2018-09-24 ENCOUNTER — Telehealth: Payer: Self-pay | Admitting: Family Medicine

## 2018-09-24 NOTE — Telephone Encounter (Signed)
Losartan really should not be causing the cough.  I think it probably some other cause.  Consider using maximum allergy medications and let me know how it goes.

## 2018-09-24 NOTE — Telephone Encounter (Signed)
Pt called and stated he has continued to have a cough with the new medication. He is taking Losartin and is wanting to know if another medication can tried. Will forward message to provider for review.

## 2018-09-25 NOTE — Telephone Encounter (Signed)
Left VM with recommendation  

## 2018-10-23 ENCOUNTER — Other Ambulatory Visit: Payer: Self-pay | Admitting: Family Medicine

## 2018-10-23 DIAGNOSIS — I10 Essential (primary) hypertension: Secondary | ICD-10-CM

## 2018-10-23 DIAGNOSIS — E785 Hyperlipidemia, unspecified: Secondary | ICD-10-CM

## 2018-10-23 DIAGNOSIS — Z0000 Encounter for general adult medical examination without abnormal findings: Secondary | ICD-10-CM

## 2018-10-23 DIAGNOSIS — R739 Hyperglycemia, unspecified: Secondary | ICD-10-CM

## 2018-10-25 NOTE — Telephone Encounter (Signed)
I advised patient to schedule a follow up soon.

## 2018-10-30 ENCOUNTER — Ambulatory Visit (INDEPENDENT_AMBULATORY_CARE_PROVIDER_SITE_OTHER): Payer: Medicare Other | Admitting: Family Medicine

## 2018-10-30 ENCOUNTER — Other Ambulatory Visit: Payer: Self-pay

## 2018-10-30 ENCOUNTER — Encounter: Payer: Self-pay | Admitting: Family Medicine

## 2018-10-30 VITALS — BP 130/79 | HR 66 | Temp 98.1°F | Wt 167.0 lb

## 2018-10-30 DIAGNOSIS — I77810 Thoracic aortic ectasia: Secondary | ICD-10-CM | POA: Diagnosis not present

## 2018-10-30 DIAGNOSIS — R739 Hyperglycemia, unspecified: Secondary | ICD-10-CM | POA: Diagnosis not present

## 2018-10-30 DIAGNOSIS — R972 Elevated prostate specific antigen [PSA]: Secondary | ICD-10-CM

## 2018-10-30 DIAGNOSIS — Z0000 Encounter for general adult medical examination without abnormal findings: Secondary | ICD-10-CM

## 2018-10-30 DIAGNOSIS — E782 Mixed hyperlipidemia: Secondary | ICD-10-CM | POA: Diagnosis not present

## 2018-10-30 DIAGNOSIS — E785 Hyperlipidemia, unspecified: Secondary | ICD-10-CM | POA: Diagnosis not present

## 2018-10-30 DIAGNOSIS — I10 Essential (primary) hypertension: Secondary | ICD-10-CM | POA: Diagnosis not present

## 2018-10-30 MED ORDER — SIMVASTATIN 20 MG PO TABS: 20.0000 mg | ORAL_TABLET | Freq: Every day | ORAL | 3 refills | Status: DC

## 2018-10-30 MED ORDER — LOSARTAN POTASSIUM 50 MG PO TABS: 50.0000 mg | ORAL_TABLET | Freq: Every day | ORAL | 3 refills | Status: DC

## 2018-10-30 NOTE — Progress Notes (Signed)
Nicholas Murillo is a 73 y.o. male who presents to [facility] today for hypertension and hyperlipidemia and elevated PSA.Belenda Cruise has hypertension which is managed with losartan.  He previously had cough with lisinopril but is been doing quite well with losartan.  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  He is at home self isolating now and notes that his cough is completely resolved.  He checks his blood pressure at home and notes blood pressures typically pretty well controlled with no chest pain palpitations shortness of breath lightheadedness or dizziness.  He takes simvastatin for hyperlipidemia and tolerates it well with no issues.  Additionally he has a history of elevated PSA.  He notes he thinks it was last checked at [facility] in late 2019 but is not sure.  He feels fine without significant lower urinary tract symptoms.  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  He denies any chest pain.  ROS as above:  Exam:  BP 130/79   Pulse 66   Temp 98.1 F (36.7 C) (Oral)   Wt 167 lb (75.8 kg)   BMI 23.96 kg/m  Wt Readings from Last 5 Encounters:  10/30/18 167 lb (75.8 kg)  07/16/18 172 lb (78 kg)  07/02/18 175 lb (79.4 kg)  06/25/18 176 lb (79.8 kg)  03/12/18 172 lb (78 kg)    Gen: Well NAD HEENT: EOMI,  MMM Lungs: Normal work of breathing. CTABL Heart: RRR no MRG Abd: NABS, Soft. Nondistended, Nontender Exts: Brisk capillary refill, warm and well perfused.      Assessment and Plan: 73 y.o. male with \ hypertension: Doing well continue current regimen check basic fasting labs in near future.  Hyperlipidemia: Lipids doing well last check about a year ago.   Will recheck fasting labs in near future.  PSA: We will recheck PSA and send results to [facility].  Thoracic aneurysm: Due for recheck.  Will order echocardiogram to be done in late May if able to do COVID-19.  This can be delayed until safer if needed.  Additionally discussed strategies to minimize COVID-19 risk.  PDMP not reviewed this encounter. Orders Placed This Encounter  Procedures  . CBC  . COMPLETE METABOLIC PANEL WITH GFR  . Lipid Panel w/reflex Direct LDL  . PSA  . ECHOCARDIOGRAM COMPLETE    Standing Status:   Future    Standing Expiration Date:   01/29/2020    Order Specific Question:   Where should this test be performed    Answer:   [facility]    Order Specific Question:   Perflutren DEFINITY (image enhancing agent) should be administered unless hypersensitivity or allergy exist    Answer:   Administer Perflutren    Order Specific Question:   Reason for exam-Echo    Answer:   Ascending aortic aneurysm  441.9 / I71    Order Specific Question:   Other Comments    Answer:   After May 31st   Meds ordered this encounter  Medications  . losartan (COZAAR) 50 MG tablet    Sig: Take 1 tablet (50 mg total) by mouth daily.    Dispense:  90 tablet    Refill:  3    Replaces lisinopril  . simvastatin (ZOCOR)  20 MG tablet    Sig: Take 1 tablet (20 mg total) by mouth daily.    Dispense:  90 tablet    Refill:  3     Historical information moved to improve visibility of documentation.  Past Medical History:  Diagnosis Date  . Arthritis   . Esophageal stricture   . GERD (gastroesophageal reflux disease)   . Hiatal hernia   . Hyperlipidemia   . Hypertension    Past Surgical History:  Procedure Laterality Date  . APPENDECTOMY  1960  . arterial catheter  1997  . UPPER GASTROINTESTINAL ENDOSCOPY     Social History   Tobacco Use  . Smoking status: Never Smoker  . Smokeless tobacco: Never Used  Substance Use Topics  . Alcohol use: No     Alcohol/week: 0.0 standard drinks    Comment: quit 20 years ago   family history includes Colon polyps in his mother; Dementia in his mother; Skin cancer in his father.  Medications: Current Outpatient Medications  Medication Sig Dispense Refill  . aspirin EC 81 MG tablet Take 81 mg by mouth daily.    Marland Kitchen EPINEPHrine (EPIPEN 2-PAK) 0.3 mg/0.3 mL IJ SOAJ injection Inject 0.3 mLs (0.3 mg total) into the muscle once. 1 Device 0  . losartan (COZAAR) 50 MG tablet Take 1 tablet (50 mg total) by mouth daily. 90 tablet 3  . magnesium oxide (MAG-OX) 400 MG tablet Take 800 mg by mouth daily.    . polyethylene glycol (MIRALAX) packet Take 17 g by mouth daily. 14 each 0  . simvastatin (ZOCOR) 20 MG tablet Take 1 tablet (20 mg total) by mouth daily. 90 tablet 3   No current facility-administered medications for this visit.    No Known Allergies   Discussed warning signs or symptoms. Please see discharge instructions. Patient expresses understanding.

## 2018-10-30 NOTE — Patient Instructions (Signed)
Thank you for coming in today.  Get labs fasting in the near future.   Recheck sooner if needed.  We should do ECHO in near future after May 31st.   I will get results to you soon after tests.   Continue current medicines.

## 2018-10-31 DIAGNOSIS — I10 Essential (primary) hypertension: Secondary | ICD-10-CM | POA: Diagnosis not present

## 2018-10-31 DIAGNOSIS — R972 Elevated prostate specific antigen [PSA]: Secondary | ICD-10-CM | POA: Diagnosis not present

## 2018-10-31 DIAGNOSIS — I77810 Thoracic aortic ectasia: Secondary | ICD-10-CM | POA: Diagnosis not present

## 2018-10-31 DIAGNOSIS — E782 Mixed hyperlipidemia: Secondary | ICD-10-CM | POA: Diagnosis not present

## 2018-11-01 LAB — COMPLETE METABOLIC PANEL WITH GFR
AG Ratio: 1.9 (calc) (ref 1.0–2.5)
ALT: 27 U/L (ref 9–46)
AST: 22 U/L (ref 10–35)
Albumin: 4 g/dL (ref 3.6–5.1)
Alkaline phosphatase (APISO): 67 U/L (ref 35–144)
BUN: 23 mg/dL (ref 7–25)
CO2: 25 mmol/L (ref 20–32)
Calcium: 8.9 mg/dL (ref 8.6–10.3)
Chloride: 111 mmol/L — ABNORMAL HIGH (ref 98–110)
Creat: 0.8 mg/dL (ref 0.70–1.18)
GFR, Est African American: 103 mL/min/{1.73_m2} (ref >=60)
GFR, Est Non African American: 89 mL/min/{1.73_m2} (ref >=60)
Globulin: 2.1 g/dL (calc) (ref 1.9–3.7)
Glucose, Bld: 101 mg/dL — ABNORMAL HIGH (ref 65–99)
Potassium: 4.4 mmol/L (ref 3.5–5.3)
Sodium: 143 mmol/L (ref 135–146)
Total Bilirubin: 0.5 mg/dL (ref 0.2–1.2)
Total Protein: 6.1 g/dL (ref 6.1–8.1)

## 2018-11-01 LAB — CBC
HCT: 43.9 % (ref 38.5–50.0)
Hemoglobin: 14.9 g/dL (ref 13.2–17.1)
MCH: 32.3 pg (ref 27.0–33.0)
MCHC: 33.9 g/dL (ref 32.0–36.0)
MCV: 95.2 fL (ref 80.0–100.0)
MPV: 10 fL (ref 7.5–12.5)
Platelets: 163 10*3/uL (ref 140–400)
RBC: 4.61 10*6/uL (ref 4.20–5.80)
RDW: 12 % (ref 11.0–15.0)
WBC: 6.4 10*3/uL (ref 3.8–10.8)

## 2018-11-01 LAB — PSA: PSA: 4.1 ng/mL — ABNORMAL HIGH (ref <=4.0)

## 2018-11-01 LAB — LIPID PANEL W/REFLEX DIRECT LDL
Cholesterol: 144 mg/dL (ref <200)
HDL: 45 mg/dL (ref >=40)
LDL Cholesterol (Calc): 84 mg/dL (calc)
Non-HDL Cholesterol (Calc): 99 mg/dL (calc) (ref <130)
Total CHOL/HDL Ratio: 3.2 (calc) (ref <5.0)
Triglycerides: 73 mg/dL (ref <150)

## 2018-11-27 ENCOUNTER — Other Ambulatory Visit: Payer: Self-pay

## 2018-11-27 ENCOUNTER — Ambulatory Visit (HOSPITAL_BASED_OUTPATIENT_CLINIC_OR_DEPARTMENT_OTHER)
Admission: RE | Admit: 2018-11-27 | Discharge: 2018-11-27 | Disposition: A | Discharge status: Home / Self Care | Payer: Medicare Other | Source: Ambulatory Visit | Attending: Family Medicine | Admitting: Family Medicine

## 2018-11-27 DIAGNOSIS — I77810 Thoracic aortic ectasia: Secondary | ICD-10-CM | POA: Insufficient documentation

## 2018-11-27 DIAGNOSIS — E782 Mixed hyperlipidemia: Secondary | ICD-10-CM | POA: Diagnosis not present

## 2018-11-27 DIAGNOSIS — I10 Essential (primary) hypertension: Secondary | ICD-10-CM | POA: Insufficient documentation

## 2018-11-27 NOTE — Progress Notes (Signed)
  Echocardiogram 2D Echocardiogram has been performed.  Nicholas Murillo 11/27/2018, 2:06 PM

## 2018-11-28 ENCOUNTER — Encounter: Payer: Self-pay | Admitting: Family Medicine

## 2018-11-28 DIAGNOSIS — I351 Nonrheumatic aortic (valve) insufficiency: Secondary | ICD-10-CM

## 2018-11-28 HISTORY — DX: Nonrheumatic aortic (valve) insufficiency: I35.1

## 2018-12-07 ENCOUNTER — Encounter: Payer: Self-pay | Admitting: Family Medicine

## 2019-02-04 ENCOUNTER — Encounter: Payer: Self-pay | Admitting: Family Medicine

## 2019-02-07 ENCOUNTER — Ambulatory Visit: Payer: Medicare Other | Admitting: Family Medicine

## 2019-02-11 ENCOUNTER — Encounter: Payer: Self-pay | Admitting: Family Medicine

## 2019-02-11 ENCOUNTER — Other Ambulatory Visit: Payer: Self-pay

## 2019-02-11 ENCOUNTER — Ambulatory Visit (INDEPENDENT_AMBULATORY_CARE_PROVIDER_SITE_OTHER): Payer: Medicare Other | Admitting: Family Medicine

## 2019-02-11 VITALS — BP 149/59 | HR 67 | Wt 168.0 lb

## 2019-02-11 DIAGNOSIS — I77810 Thoracic aortic ectasia: Secondary | ICD-10-CM

## 2019-02-11 DIAGNOSIS — I499 Cardiac arrhythmia, unspecified: Secondary | ICD-10-CM | POA: Diagnosis not present

## 2019-02-11 DIAGNOSIS — I491 Atrial premature depolarization: Secondary | ICD-10-CM | POA: Insufficient documentation

## 2019-02-11 DIAGNOSIS — I10 Essential (primary) hypertension: Secondary | ICD-10-CM | POA: Diagnosis not present

## 2019-02-11 MED ORDER — METOPROLOL SUCCINATE ER 25 MG PO TB24: 25.0000 mg | ORAL_TABLET | Freq: Every day | ORAL | 1 refills | Status: DC

## 2019-02-11 NOTE — Progress Notes (Signed)
Nicholas Murillo is a 73 y.o. male who presents to [facility] today for palpitations and follow-up heart risk.  Patient notes that has been more aware of palpitations over the last few days.  He has had increased stress.  His wife is currently undergoing cancer treatment.  He also drinks about 2 cups of caffeine a day.  He denies shortness of breath chest pain or significant or severe fatigue associated with palpitations.  He denies severe lightheadedness or dizziness.  He notes his blood pressure at home is usually 130s over 50s.  He takes losartan 50 mg daily.  He also has a history of an ascending aortic aneurysm measuring 41 mm seen on echocardiogram.  This did not change in 1 year from 2019-2020.  CT angiogram chest was delayed or deferred due to COVID-19.   ROS as above:  Exam:  BP (!) 149/59    Pulse 67    Wt 168 lb (76.2 kg)    SpO2 98%    BMI 24.11 kg/m  Wt Readings from Last 5 Encounters:  02/11/19 168 lb (76.2 kg)  10/30/18 167 lb (75.8 kg)  07/16/18 172 lb (78 kg)  07/02/18 175 lb (79.4 kg)  06/25/18 176 lb (79.8 kg)    Gen: Well NAD HEENT: EOMI,  MMM Lungs: Normal work of breathing. CTABL Heart: RRR no MRG Abd: NABS, Soft. Nondistended, Nontender Exts: Brisk capillary refill, warm and well perfused.   Lab and Radiology Results Twelve-lead EKG shows sinus rhythm with frequent PACs in a bigeminy pattern.  Rate is 64 bpm.  No ST segment elevation or depression.  QTc 392.  No other arrhythmias noted.   Assessment and Plan: 73 y.o. male with  Palpitations very likely PACs based on current EKG.  After discussion plan for starting low-dose metoprolol.  Given overall risk think is reasonable for [name] to see cardiology.  Plan to refer to cardiology for further evaluation and further treatment if needed.  Effectively does he need a Holter monitor or event  monitor?  Does he need stress test?  Additionally he really like to avoid CT scan of his chest is that reasonable?   Recheck back with me as needed.  Monitor hypertension.  PDMP not reviewed this encounter. Orders Placed This Encounter  Procedures   EKG 12-Lead   No orders of the defined types were placed in this encounter.    Historical information moved to improve visibility of documentation.  Past Medical History:  Diagnosis Date   Aortic valve regurgitation 11/28/2018   Moderate echocardiogram May 2020   Arthritis    Esophageal stricture    GERD (gastroesophageal reflux disease)    Hiatal hernia    Hyperlipidemia    Hypertension    Past Surgical History:  Procedure Laterality Date   APPENDECTOMY  1960   arterial catheter  1997   UPPER GASTROINTESTINAL ENDOSCOPY     Social History   Tobacco Use   Smoking status: Never Smoker   Smokeless tobacco: Never Used  Substance Use Topics   Alcohol use: No    Alcohol/week: 0.0 standard drinks    Comment: quit 20 years ago   family history includes Colon polyps in his mother; Dementia in his mother; Skin cancer in his father.  Medications: Current Outpatient Medications  Medication Sig Dispense Refill   aspirin EC 81 MG tablet Take 81 mg by mouth daily.     EPINEPHrine (EPIPEN 2-PAK) 0.3 mg/0.3  mL IJ SOAJ injection Inject 0.3 mLs (0.3 mg total) into the muscle once. 1 Device 0   losartan (COZAAR) 50 MG tablet Take 1 tablet (50 mg total) by mouth daily. 90 tablet 3   magnesium oxide (MAG-OX) 400 MG tablet Take 800 mg by mouth daily.     polyethylene glycol (MIRALAX) packet Take 17 g by mouth daily. 14 each 0   simvastatin (ZOCOR) 20 MG tablet Take 1 tablet (20 mg total) by mouth daily. 90 tablet 3   No current facility-administered medications for this visit.    No Known Allergies   Discussed warning signs or symptoms. Please see discharge instructions. Patient expresses understanding.

## 2019-02-11 NOTE — Patient Instructions (Addendum)
Thank you for coming in today.  I think the palpations are PAC.  Take metoprolol 25 mg daily. Ok to take in the evening or the morning.  You should hear about cardiology referral. Let me know if you do not hear anything in 1 week.  Start metoprolol. Reduce caffeine.  Keep me updated.   I think you have PAC.   Premature Atrial Contraction  A premature atrial contraction [facility]) is a kind of irregular heartbeat (arrhythmia). It happens when the heart beats too early and then pauses before beating again. The heart has four areas, or chambers. Normally, electrical signals spread across the heart and make all the chambers beat together. During a PAC, the upper chambers of the heart (atria) beat too early, before they have had time to fill with blood. The heartbeat pauses afterward so the heart can fill with blood for the next beat. Sometimes PAC can be a warning sign of another type of arrhythmia called atrial fibrillation. Atrial fibrillation may allow blood to pool in the atria and form clots. If a clot travels to the brain, it can cause a stroke. What are the causes? The cause of this condition is often unknown. Sometimes, this condition may be caused by heart disease or injury to the heart. What increases the risk? You are more likely to develop this condition if:  You are a child.  You are an adult who is 70 years of age or older. Episodes may be triggered by:  Caffeine.  Alcohol.  Tobacco use.  Stimulant drugs.  Some medicines or supplements.  Stress.  Heart disease. What are the signs or symptoms? Symptoms of this condition include:  A feeling that your heart skipped a beat. The first heartbeat after the "skipped" beat may feel more forceful.  A feeling that your heart is fluttering. How is this diagnosed? This condition is diagnosed based on:  Your symptoms.  A physical exam. Your health care provider may listen to your heart.  An electrocardiogram (ECG). This is a  test that records the electrical impulses of the heart.  An ambulatory cardiac monitor. This device records your heartbeats for 24 hours or more. You may also have:  An echocardiogram to check for any heart conditions. This is a type of imaging test that uses sound waves (ultrasound) to make images of your heart.  Blood tests. How is this treated? Treatment depends on the frequency of your symptoms and other risk factors. Treatments may include:  Medicines (beta-blockers).  Catheter ablation. This is done to destroy the part of the heart tissue that sends abnormal signals. In some cases, treatment may not be needed for this condition. Follow these instructions at home: Lifestyle  Do not use any products that contain nicotine or tobacco, such as cigarettes, e-cigarettes, and chewing tobacco. If you need help quitting, ask your health care provider.  Exercise regularly. Ask your health care provider what type of exercise is safe for you.  Find healthy ways to manage stress.  Try to get at least 7-9 hours of sleep each night, or as much as recommended by your health care provider. Alcohol use  Do not drink alcohol if: ? Your health care provider tells you not to drink. ? You are pregnant, may be pregnant, or are planning to become pregnant. ? Alcohol triggers your episodes.  If you drink alcohol: ? Limit how much you use to:  0-1 drink a day for women.  0-2 drinks a day for men. ? Be  aware of how much alcohol is in your drink. In the [place] one drink equals one 12 oz bottle of beer (355 mL), one 5 oz glass of wine (148 mL), or one 1 oz glass of hard liquor (44 mL). General instructions  Take over-the-counter and prescription medicines only as told by your health care provider.  If caffeine triggers episodes, do not eat, drink, or use anything with caffeine in it.  Keep all follow-up visits as told by your health care provider. This is important. Contact a health care  provider if:  You feel your heart skipping beats.  Your heart skips beats and you feel dizzy, light-headed, or very tired. Get help right away if you have:  Chest pain.  Trouble breathing.  Any symptoms of a stroke. "BE FAST" is an easy way to remember the main warning signs of a stroke. ? B - Balance. Signs are dizziness, sudden trouble walking, or loss of balance. ? E - Eyes. Signs are trouble seeing or a sudden change in vision. ? F - Face. Signs are sudden weakness or numbness of the face, or the face or eyelid drooping on one side. ? A - Arms. Signs are weakness or numbness in an arm. This happens suddenly and usually on one side of the body. ? S - Speech. Signs are sudden trouble speaking, slurred speech, or trouble understanding what people say. ? T - Time. Time to call emergency services. Write down what time symptoms started.  Other signs of stroke, such as: ? A sudden, severe headache with no known cause. ? Nausea or vomiting. ? Seizure. These symptoms may represent a serious problem that is an emergency. Do not wait to see if the symptoms will go away. Get medical help right away. Call your local emergency services (911 in the [place] Do not drive yourself to the hospital. Summary  A premature atrial contraction [facility]) is a kind of irregular heartbeat (arrhythmia). It happens when the heart beats too early and then pauses before beating again.  Treatment depends on your symptoms and whether you have other underlying heart conditions.  Contact a health care provider if your heart skips beats and you feel dizzy, light-headed, or very tired.  In some cases, this condition may lead to a stroke. "BE FAST" is an easy way to remember the warning signs of stroke. Get help right away if you have any of the "BE FAST" signs. This information is not intended to replace advice given to you by your health care provider. Make sure you discuss any questions you have with your health care  provider. Document Released: 02/21/2014 Document Revised: 03/15/2018 Document Reviewed: 03/15/2018 Elsevier Patient Education  [place].  Metoprolol extended-release capsules What is this medicine? METOPROLOL (me TOE proe lole) is a beta-blocker. Beta-blockers reduce the workload on the heart and help it to beat more regularly. This medicine is used to treat high blood pressure and to prevent chest pain. It is also used after a heart attack to prevent an additional heart attack from occurring. This medicine may be used for other purposes; ask your health care provider or pharmacist if you have questions. COMMON BRAND NAME(S): KAPSPARGO What should I tell my health care provider before I take this medicine? They need to know if you have any of these conditions:  diabetes  heart disease  liver disease  lung or breathing disease, like asthma  pheochromocytoma  thyroid disease  an unusual or allergic reaction to metoprolol, other beta-blockers,  medicines, foods, dyes, or preservatives  pregnant or trying to get pregnant  breast-feeding How should I use this medicine? Take this medicine by mouth. The capsules can be swallowed whole or opened carefully and the contents sprinkled over a small amount (teaspoonful) of soft food, such as applesauce, pudding, or yogurt. This mixture must be swallowed within 60 minutes and not stored for future use. Do not chew this medicine. You can take it with or without food. If it upsets your stomach, take it with food. Take your medicine at regular intervals. Do not take it more often than directed. Do not stop taking except on your doctor's advice. Talk to your pediatrician regarding the use of this medicine in children. While this drug may be prescribed for children as young as 6 for selected conditions, precautions do apply. Overdosage: If you think you have taken too much of this medicine contact a poison control center or emergency room at  once. NOTE: This medicine is only for you. Do not share this medicine with others. What if I miss a dose? If you miss a dose, take it as soon as you can. If it is almost time for your next dose, take only that dose. Do not take double or extra doses. What may interact with this medicine? This medicine may interact with the following medications:  certain medicines for blood pressure, heart disease, irregular heart beat  epinephrine  fluoxetine  MAOIs like Carbex, Eldepryl, Marplan, Nardil, and Parnate  paroxetine  reserpine This list may not describe all possible interactions. Give your health care provider a list of all the medicines, herbs, non-prescription drugs, or dietary supplements you use. Also tell them if you smoke, drink alcohol, or use illegal drugs. Some items may interact with your medicine. What should I watch for while using this medicine? You may get drowsy or dizzy. Do not drive, use machinery, or do anything that needs mental alertness until you know how this medicine affects you. Do not stand or sit up quickly, especially if you are an older patient. This reduces the risk of dizzy or fainting spells. Alcohol may interfere with the effect of this medicine. Avoid alcoholic drinks. Visit your doctor or health care professional for regular checks on your progress. Check your blood pressure as directed. Ask your doctor or health care professional what your blood pressure should be and when you should contact him or her. Do not treat yourself for coughs, colds, or pain while you are using this medicine without asking your doctor or health care professional for advice. Some ingredients may increase your blood pressure. This medicine may increase blood sugar. Ask your healthcare provider if changes in diet or medicines are needed if you have diabetes. What side effects may I notice from receiving this medicine? Side effects that you should report to your doctor or health care  professional as soon as possible:  allergic reactions like skin rash, itching or hives, swelling of the face, lips, or tongue  cold hands or feet  signs and symptoms of high blood sugar such as being more thirsty or hungry or having to urinate more than normal. You may also feel very tired or have blurry vision.  signs and symptoms of low blood pressure like dizziness; feeling faint or lightheaded, falls; unusually weak or tired  signs of worsening heart failure like breathing problems, swelling in your legs and feet  suicidal thoughts or other mood changes  unusually slow heartbeat Side effects that usually do not  require medical attention (report these to your doctor or health care professional if they continue or are bothersome):  anxious  change in sex drive or performance  diarrhea  headache  trouble sleeping  upset stomach This list may not describe all possible side effects. Call your doctor for medical advice about side effects. You may report side effects to FDA at 1-800-FDA-1088. Where should I keep my medicine? Keep out of the reach of children. Store at room temperature between 15 and 30 degrees C (59 and 86 degrees F). Throw away any unused medicine after the expiration date. NOTE: This sheet is a summary. It may not cover all possible information. If you have questions about this medicine, talk to your doctor, pharmacist, or health care provider.  2020 Elsevier/Gold Standard (2018-04-10 11:07:10)

## 2019-03-06 ENCOUNTER — Ambulatory Visit (INDEPENDENT_AMBULATORY_CARE_PROVIDER_SITE_OTHER): Payer: Medicare Other | Admitting: Family Medicine

## 2019-03-06 DIAGNOSIS — Z23 Encounter for immunization: Secondary | ICD-10-CM

## 2019-04-08 NOTE — Progress Notes (Signed)
Nicholas Prose MD Reason for referral-palpitations  HPI: 73 year old male for evaluation of palpitations at request of Lynne Leader, MD. CT scan June 2019 showed no abdominal aortic aneurysm.  Echocardiogram May 2020 showed normal LV function, grade 1 diastolic dysfunction, moderate aortic insufficiency and mildly dilated aortic root at 41 mm.  Patient denies dyspnea on exertion, orthopnea, PND, pedal edema, syncope or chest pain.  His palpitations are described as a skip and have improved with the addition of Toprol.  Current Outpatient Medications  Medication Sig Dispense Refill  . aspirin EC 81 MG tablet Take 81 mg by mouth daily.    Marland Kitchen EPINEPHrine (EPIPEN 2-PAK) 0.3 mg/0.3 mL IJ SOAJ injection Inject 0.3 mLs (0.3 mg total) into the muscle once. 1 Device 0  . losartan (COZAAR) 50 MG tablet Take 1 tablet (50 mg total) by mouth daily. 90 tablet 3  . magnesium oxide (MAG-OX) 400 MG tablet Take 800 mg by mouth daily.    . metoprolol succinate (TOPROL-XL) 25 MG 24 hr tablet Take 1 tablet (25 mg total) by mouth daily. 90 tablet 1  . polyethylene glycol (MIRALAX) packet Take 17 g by mouth daily. 14 each 0  . simvastatin (ZOCOR) 20 MG tablet Take 1 tablet (20 mg total) by mouth daily. 90 tablet 3   No current facility-administered medications for this visit.     No Known Allergies   Past Medical History:  Diagnosis Date  . Aortic valve regurgitation 11/28/2018   Moderate echocardiogram May 2020  . Arthritis   . Esophageal stricture   . GERD (gastroesophageal reflux disease)   . Hiatal hernia   . Hyperlipidemia   . Hypertension     Past Surgical History:  Procedure Laterality Date  . APPENDECTOMY  1960  . arterial catheter  1997  . UPPER GASTROINTESTINAL ENDOSCOPY      Social History   Socioeconomic History  . Marital status: Significant Other    Spouse name: [name]  . Number of children: 2  . Years of education: 80  . Highest education level: Master's degree (e.g., MA,  MS, MEng, MEd, MSW, MBA)  Occupational History  . Occupation: semi-retired   . Occupation: [name]  Social Needs  . Financial resource strain: Not hard at all  . Food insecurity    Worry: Never true    Inability: Never true  . Transportation needs    Medical: No    Non-medical: No  Tobacco Use  . Smoking status: Former Research scientist (life sciences)  . Smokeless tobacco: Never Used  Substance and Sexual Activity  . Alcohol use: No    Alcohol/week: 0.0 standard drinks    Comment: quit 20 years ago  . Drug use: No    Comment: quit 1994 (cocaine,pot,"anything could get hands on")  . Sexual activity: Yes  Lifestyle  . Physical activity    Days per week: 6 days    Minutes per session: 20 min  . Stress: Not at all  Relationships  . Social Herbalist on phone: Three times a week    Gets together: Never    Attends religious service: Never    Active member of club or organization: No    Attends meetings of clubs or organizations: Never    Relationship status: Living with partner  . Intimate partner violence    Fear of current or ex partner: No    Emotionally abused: No    Physically abused: No    Forced sexual activity: No  Other Topics Concern  . Not on file  Social History Narrative   Works part time at [employer] just to keep his self social. 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 Drinks 2 cupsa of caffeine during the day    Family History  Problem Relation Age of Onset  . Skin cancer Father   . Colon polyps Mother        benign  . Dementia Mother   . Colon cancer Neg Hx   . Stomach cancer Neg Hx   . Esophageal cancer Neg Hx   . Rectal cancer Neg Hx     ROS: no fevers or chills, productive cough, hemoptysis, dysphasia, odynophagia, melena, hematochezia, dysuria, hematuria, rash, seizure activity, orthopnea, PND, pedal edema, claudication. Remaining systems are negative.  Physical Exam:   Blood pressure (!) 145/69, pulse  65, height 5\' 10"  (1.778 m), weight 172 lb (78 kg).  General:  Well developed/well nourished in NAD Skin warm/dry Patient not depressed No peripheral clubbing Back-normal HEENT-normal/normal eyelids Neck supple/normal carotid upstroke bilaterally; no bruits; no JVD; no thyromegaly chest - CTA/ normal expansion CV - RRR/normal S1 and S2; no rubs or gallops;  PMI nondisplaced; 2/6 diastolic murmur left sternal border. Abdomen -NT/ND, no HSM, no mass, + bowel sounds, no bruit 2+ femoral pulses, no bruits Ext-no edema, chords, 2+ DP Neuro-grossly nonfocal  ECG -sinus rhythm with occasional PAC, no ST changes.  Personally reviewed  A/P  1 palpitations-continue present dose of beta-blocker.  Symptoms are reasonably well controlled.  2 mildly dilated aortic root-I will arrange CTA to more fully assess.  3 moderate aortic insufficiency-patient will need a follow-up echocardiogram May 2021.  4 hypertension-blood pressure is elevated.  Increase losartan to 100 mg daily and follow.  Check potassium and renal function in 1 week.  5 hyperlipidemia-continue statin.  Kirk Ruths, MD

## 2019-04-17 ENCOUNTER — Ambulatory Visit (INDEPENDENT_AMBULATORY_CARE_PROVIDER_SITE_OTHER): Payer: Medicare Other | Admitting: Cardiology

## 2019-04-17 ENCOUNTER — Other Ambulatory Visit: Payer: Self-pay

## 2019-04-17 ENCOUNTER — Encounter: Payer: Self-pay | Admitting: Cardiology

## 2019-04-17 VITALS — BP 145/69 | HR 65 | Ht 70.0 in | Wt 172.0 lb

## 2019-04-17 DIAGNOSIS — I712 Thoracic aortic aneurysm, without rupture, unspecified: Secondary | ICD-10-CM

## 2019-04-17 DIAGNOSIS — R002 Palpitations: Secondary | ICD-10-CM

## 2019-04-17 DIAGNOSIS — I351 Nonrheumatic aortic (valve) insufficiency: Secondary | ICD-10-CM | POA: Diagnosis not present

## 2019-04-17 DIAGNOSIS — I10 Essential (primary) hypertension: Secondary | ICD-10-CM

## 2019-04-17 MED ORDER — LOSARTAN POTASSIUM 100 MG PO TABS: 100.0000 mg | ORAL_TABLET | Freq: Every day | ORAL | 3 refills | Status: DC

## 2019-04-17 NOTE — Patient Instructions (Signed)
Medication Instructions:  INCREASE LOSARTAN TO 100 MG ONCE DAILY=2 OF THE 50 MG TABLETS ONCE DAILY    If you need a refill on your cardiac medications before your next appointment, please call your pharmacy.   Lab work: Your physician recommends that you return for lab work in:ONE WEEK  If you have labs (blood work) drawn today and your tests are completely normal, you will receive your results only by: Marland Kitchen MyChart Message (if you have MyChart) OR . A paper copy in the mail If you have any lab test that is abnormal or we need to change your treatment, we will call you to review the results.  Testing/Procedures: CTA OF THE CHEST TO FOLLOW UP ON THORACIC ANEURYSM IN THE [place] OFFICE= [facility]  Follow-Up: At [facility], you and your health needs are our priority.  As part of our continuing mission to provide you with exceptional heart care, we have created designated Provider Care Teams.  These Care Teams include your primary Cardiologist (physician) and Advanced Practice Providers (APPs -  Physician Assistants and Nurse Practitioners) who all work together to provide you with the care you need, when you need it. Your physician wants you to follow-up in: [place] will receive a reminder letter in the mail two months in advance. If you don't receive a letter, please call our office to schedule the follow-up appointment.

## 2019-04-23 DIAGNOSIS — I10 Essential (primary) hypertension: Secondary | ICD-10-CM | POA: Diagnosis not present

## 2019-04-24 ENCOUNTER — Other Ambulatory Visit: Payer: Self-pay | Admitting: Cardiology

## 2019-04-24 LAB — BASIC METABOLIC PANEL
BUN: 24 mg/dL (ref 7–25)
CO2: 23 mmol/L (ref 20–32)
Calcium: 9.1 mg/dL (ref 8.6–10.3)
Chloride: 107 mmol/L (ref 98–110)
Creat: 1 mg/dL (ref 0.70–1.18)
Glucose, Bld: 105 mg/dL — ABNORMAL HIGH (ref 65–99)
Potassium: 4.3 mmol/L (ref 3.5–5.3)
Sodium: 140 mmol/L (ref 135–146)

## 2019-04-29 ENCOUNTER — Other Ambulatory Visit: Payer: Self-pay

## 2019-04-29 ENCOUNTER — Ambulatory Visit (INDEPENDENT_AMBULATORY_CARE_PROVIDER_SITE_OTHER): Payer: Medicare Other

## 2019-04-29 DIAGNOSIS — I712 Thoracic aortic aneurysm, without rupture, unspecified: Secondary | ICD-10-CM

## 2019-04-29 IMAGING — CT CT ANGIO CHEST
2 of 6 series · 18 of 46 positions shown · IV contrast (APPLIED)
Comparison: Prior CT scan of the abdomen and pelvis [DATE]

CLINICAL DATA: 73-year-old male with thoracic aortic aneurysm

EXAM:
CT ANGIOGRAPHY CHEST WITH CONTRAST
TECHNIQUE: Multidetector CT imaging of the chest was performed using the
standard protocol during bolus administration of intravenous
contrast. Multiplanar CT image reconstructions and MIPs were
obtained to evaluate the vascular anatomy.
CONTRAST:  100mL OMNIPAQUE IOHEXOL 350 MG/ML SOLN

[Series 6: axial arterial · axial · arterial · 0.67mm/px · z∈[+952,+1258]mm · 15 of 114 slices shown]
[im 6/114  lung]
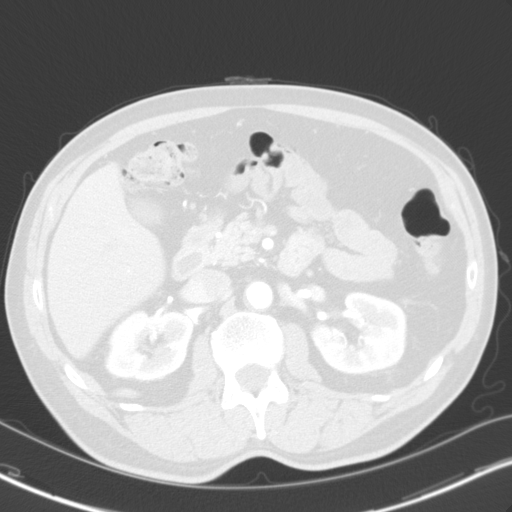
[im 12/114  soft-tissue]
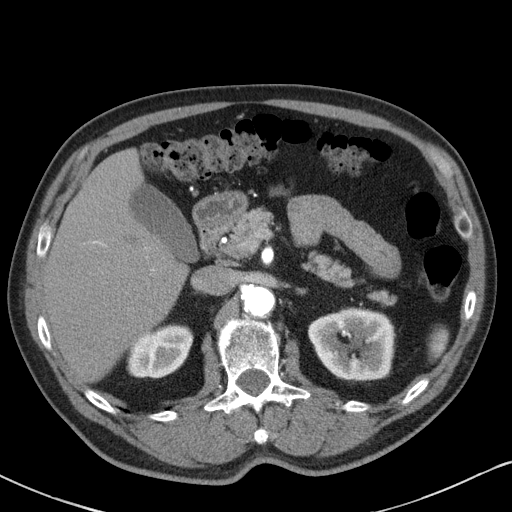
[im 24/114  lung]
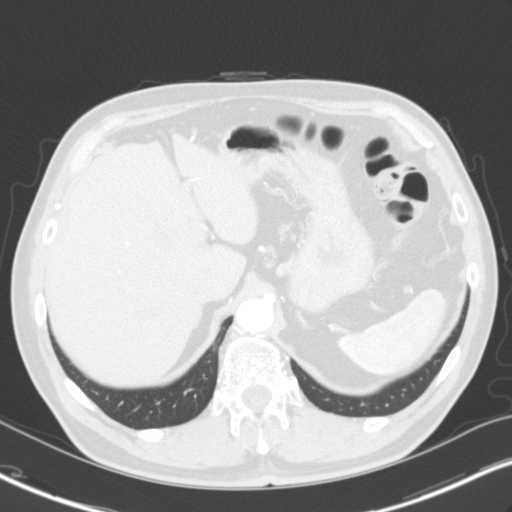
[im 30/114  soft-tissue]
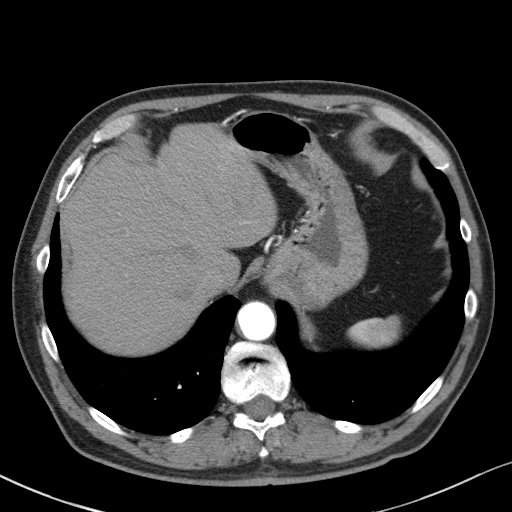
[im 36/114  lung]
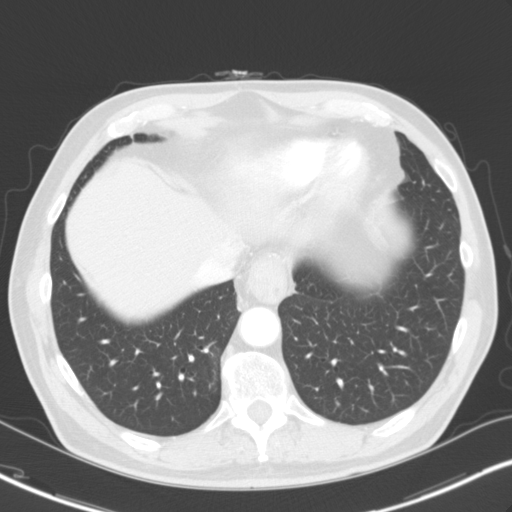
[im 42/114  soft-tissue]
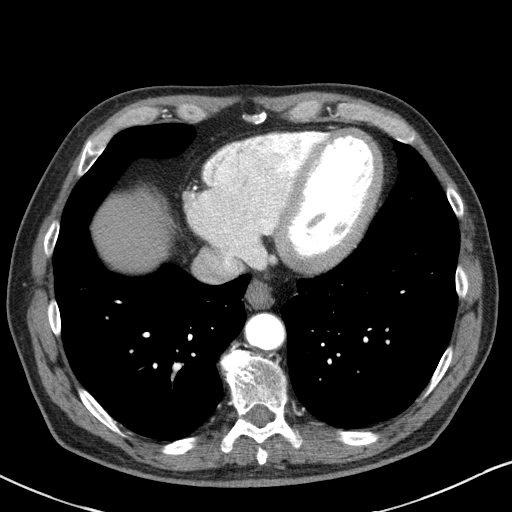
[im 48/114  lung]
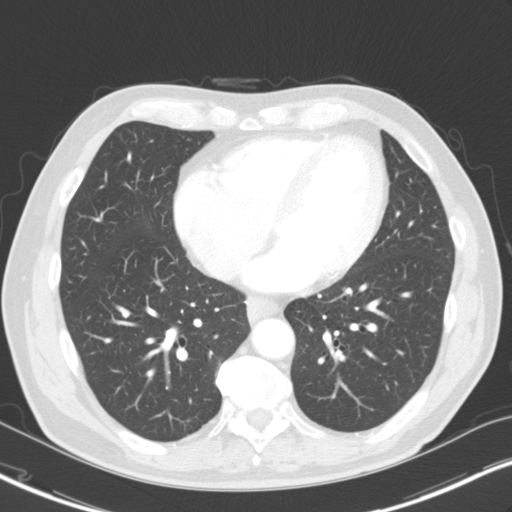
[im 60/114  soft-tissue]
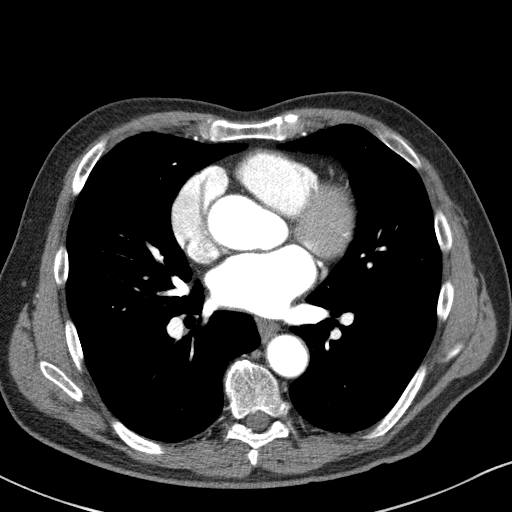
[im 66/114  lung]
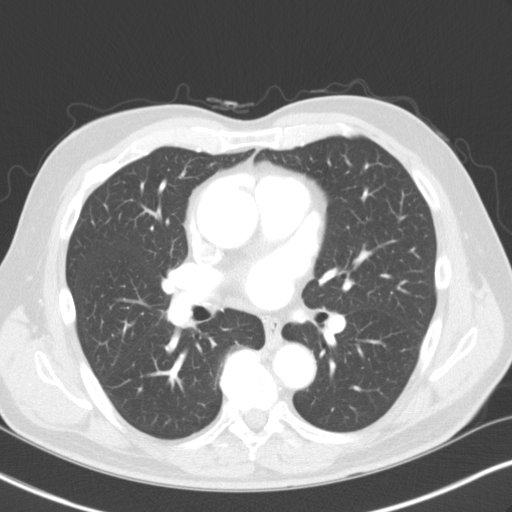
[im 72/114  soft-tissue]
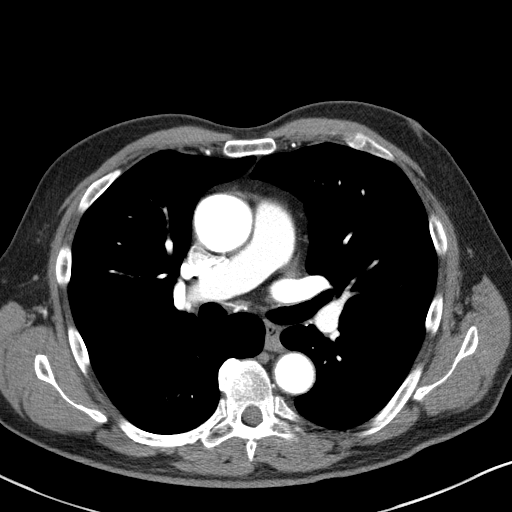
[im 78/114  lung]
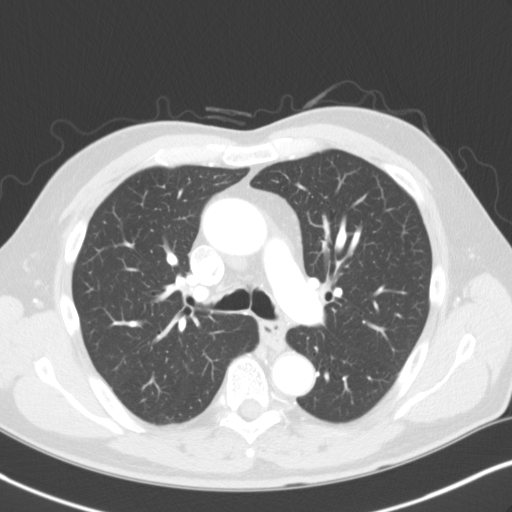
[im 84/114  soft-tissue]
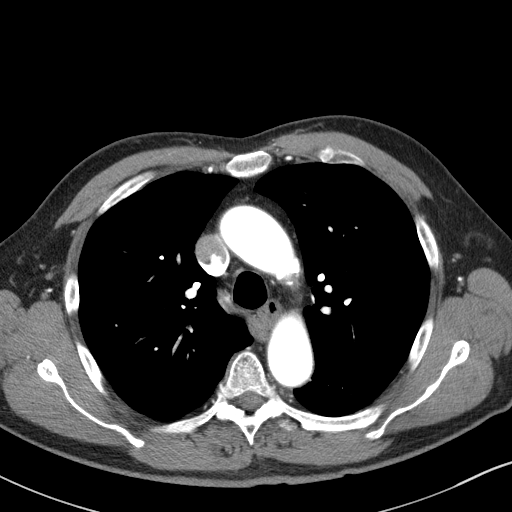
[im 96/114  lung]
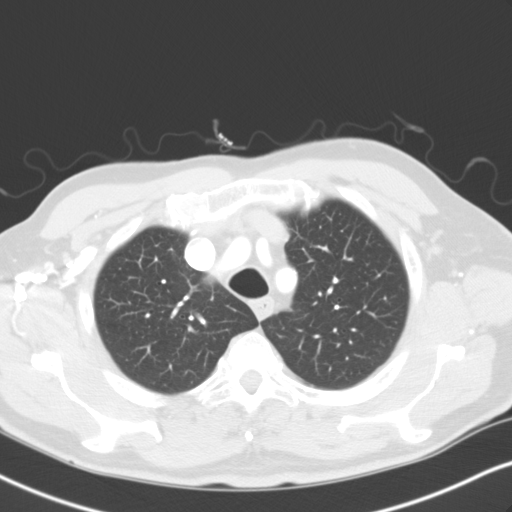
[im 102/114  soft-tissue]
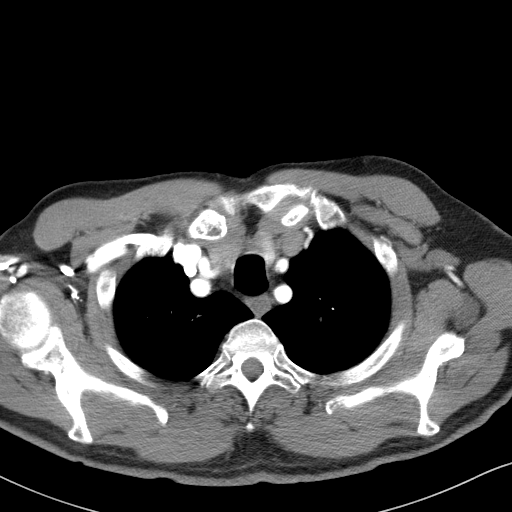
[im 108/114  lung]
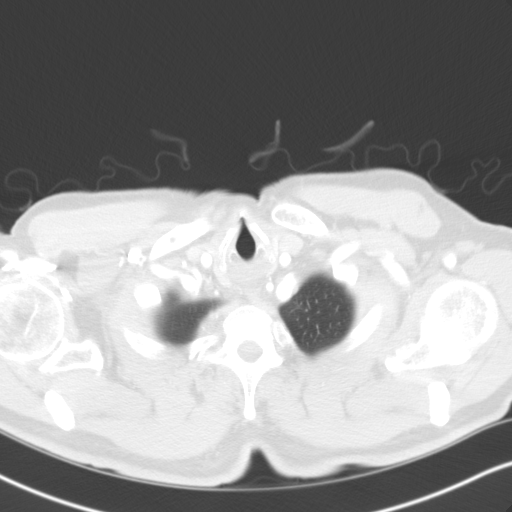

[Series 8: coronal · coronal · 0.69mm/px · 3 of 90 slices shown]
[im 23/90  soft-tissue]
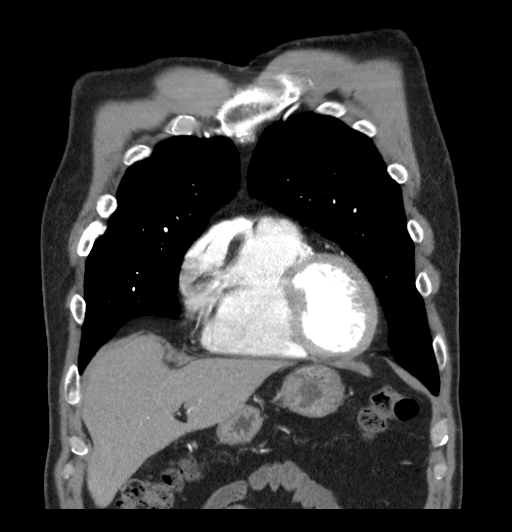
[im 45/90  soft-tissue]
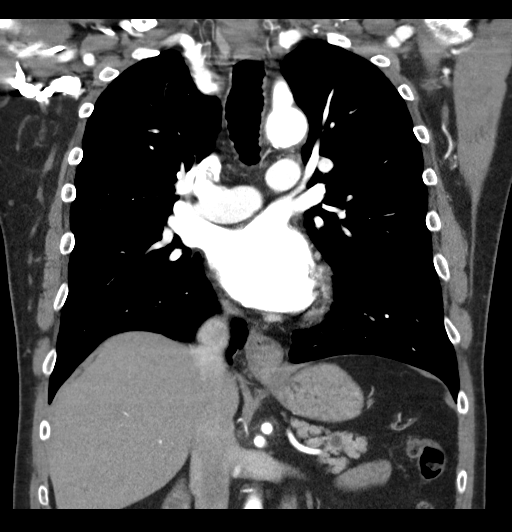
[im 67/90  soft-tissue]
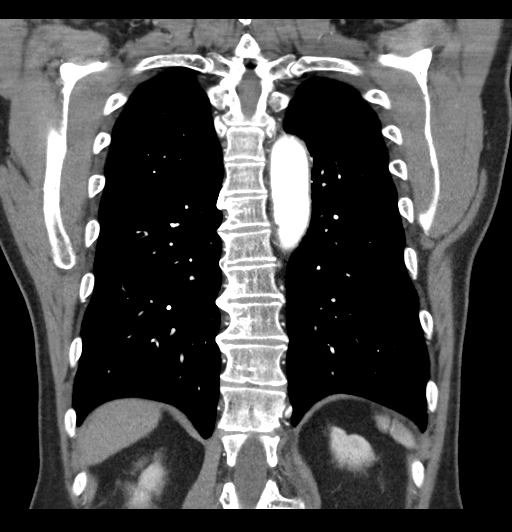

[18 of 46 positions shown; findings below may reference images not displayed]

FINDINGS: Cardiovascular: Conventional 3 vessel aortic arch. Scattered
heterogeneous atherosclerotic calcifications. The aortic root is
normal in caliber. There is no effacement of the IDOLINA
junction. Mild fusiform aneurysmal dilation of the tubular portion
of the ascending thoracic aorta with a maximal transverse diameter
of 4.0 cm. No evidence of aortic dissection. Calcifications are
visualized along the coronary arteries. The heart is at the upper
limits of normal for size. No pericardial effusion. The main
pulmonary artery is normal in size.

Mediastinum/Nodes: Unremarkable CT appearance of the thyroid gland.
No suspicious mediastinal or hilar adenopathy. No soft tissue
mediastinal mass. Small hiatal hernia.

Lungs/Pleura: Lungs are clear. No pleural effusion or pneumothorax.

Upper Abdomen: No acute abnormality within the upper abdomen. A 1 cm
hypoechoic lesion is identified in the pancreatic tail. Comparison
is made to the prior CT scan of the abdomen and pelvis. A similar
lesion was present at that time measuring approximately 0.8 cm.
Differences in slice selection may be responsible for the slight
change in size.

Musculoskeletal: No acute fracture or aggressive appearing lytic or
blastic osseous lesion.

Review of the MIP images confirms the above findings.
IMPRESSION: 1. Mild fusiform aneurysmal dilation of the tubular portion of the
ascending thoracic aorta with a maximal transverse diameter of
cm. Recommend annual imaging followup by CTA or MRA. This
recommendation follows [IJ]
ACCF/AHA/AATS/ACR/ASA/SCA/IDOLINA/IDOLINA/IDOLINA/IDOLINA Guidelines for the
Diagnosis and Management of Patients with Thoracic Aortic Disease.
Circulation. [IJ]; 121: [id number]. Aortic aneurysm NOS
([IJ]-[IJ]).
2. Incidental note is made of a small 1 cm circumscribed
low-attenuation lesion in the posterior aspect of the pancreatic
tail. Recommended surveillance for a less than 1.5 cm incidentally
detected pancreatic cyst in a patient age 65-69 is by dedicated CT
or MRI scan of the abdomen using pancreas protocol every 2 years in
till 10 year stability is confirmed. This recommendation follows ACR
consensus guidelines: Management of Incidental Pancreatic Cysts: A
White Paper of the ACR Incidental Findings Committee. [HOSPITAL] [IJ];[DATE].
3. Borderline cardiomegaly.
4. Coronary artery calcifications.
5. Small hiatal hernia.

## 2019-04-29 MED ORDER — IOHEXOL 350 MG/ML SOLN
100.0000 mL | Freq: Once | INTRAVENOUS | Status: AC | PRN
  Administered 2019-04-29: 10:00:00 100 mL via INTRAVENOUS

## 2019-04-30 ENCOUNTER — Other Ambulatory Visit: Payer: Self-pay | Admitting: *Deleted

## 2019-04-30 DIAGNOSIS — R933 Abnormal findings on diagnostic imaging of other parts of digestive tract: Secondary | ICD-10-CM

## 2019-04-30 DIAGNOSIS — K869 Disease of pancreas, unspecified: Secondary | ICD-10-CM

## 2019-05-21 ENCOUNTER — Ambulatory Visit
Admission: RE | Admit: 2019-05-21 | Discharge: 2019-05-21 | Disposition: A | Discharge status: Home / Self Care | Payer: Medicare Other | Source: Ambulatory Visit | Attending: Cardiology | Admitting: Cardiology

## 2019-05-21 ENCOUNTER — Other Ambulatory Visit: Payer: Self-pay

## 2019-05-21 DIAGNOSIS — R933 Abnormal findings on diagnostic imaging of other parts of digestive tract: Secondary | ICD-10-CM

## 2019-05-21 DIAGNOSIS — K862 Cyst of pancreas: Secondary | ICD-10-CM | POA: Diagnosis not present

## 2019-05-21 DIAGNOSIS — K869 Disease of pancreas, unspecified: Secondary | ICD-10-CM

## 2019-05-21 IMAGING — MR MR ABDOMEN WO/W CM
12 of 20 series · 25 of 48 positions shown · IV contrast (15ml Multihance)
Comparison: [DATE] and [DATE]

CLINICAL DATA: Indeterminate pancreatic lesion on prior CT.

EXAM:
MRI ABDOMEN WITHOUT AND WITH CONTRAST
TECHNIQUE: Multiplanar multisequence MR imaging of the abdomen was performed
both before and after the administration of intravenous contrast.
CONTRAST:  15mL MULTIHANCE GADOBENATE DIMEGLUMINE 529 MG/ML IV SOLN

[Series 3: cor haste · coronal · 5.0mm · 0.70mm/px · 2 of 40 slices shown]
[im 1/40]
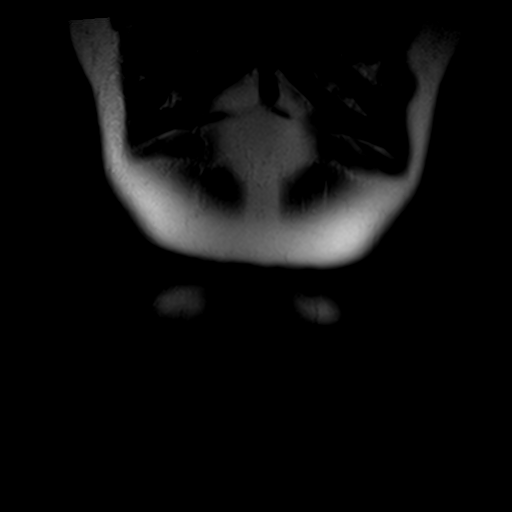
[im 40/40]
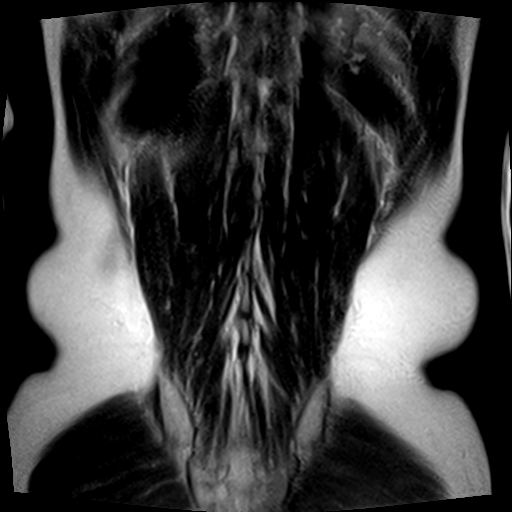

[Series 4: haste axial · axial · 5.0mm · 0.70mm/px · z∈[-84,+129]mm · 2 of 35 slices shown]
[im 1/35]
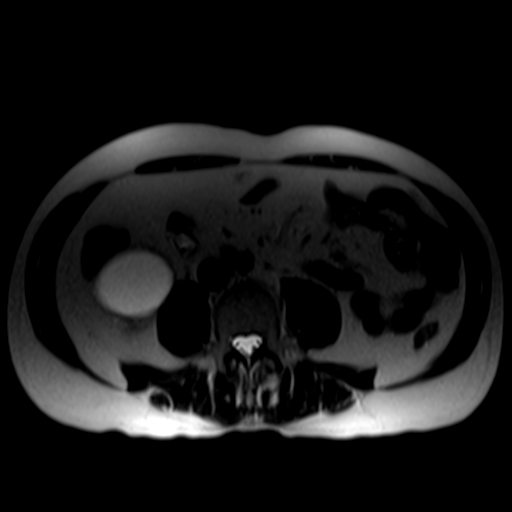
[im 35/35]
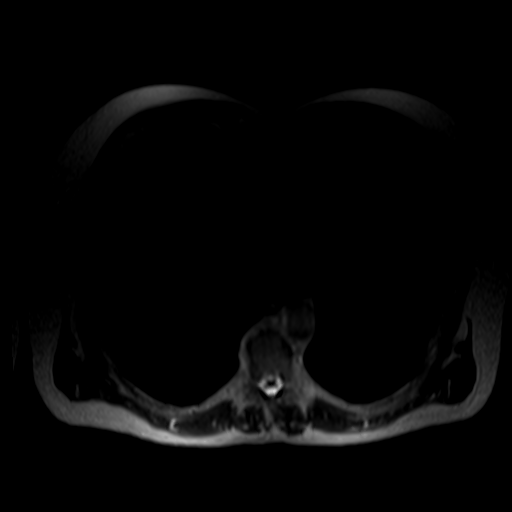

[Series 5: T1 · axial · 5.0mm · 0.70mm/px · z∈[-105,+129]mm · 3 of 80 slices shown]
[im 1/80]
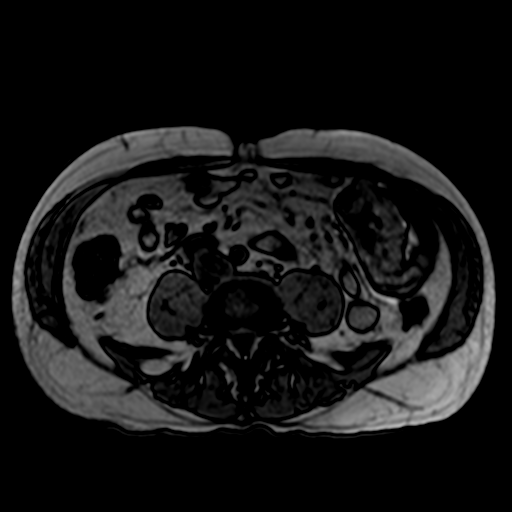
[im 40/80]
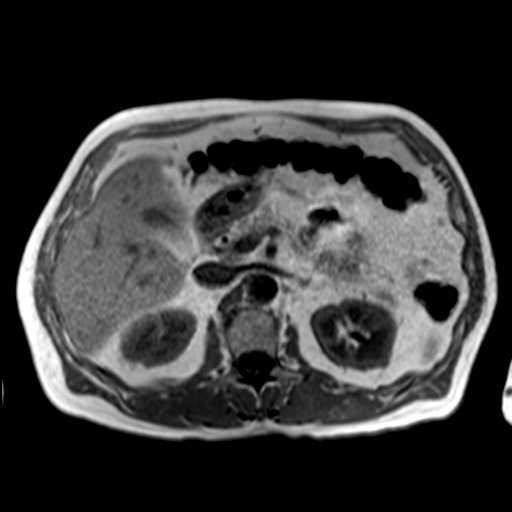
[im 80/80]
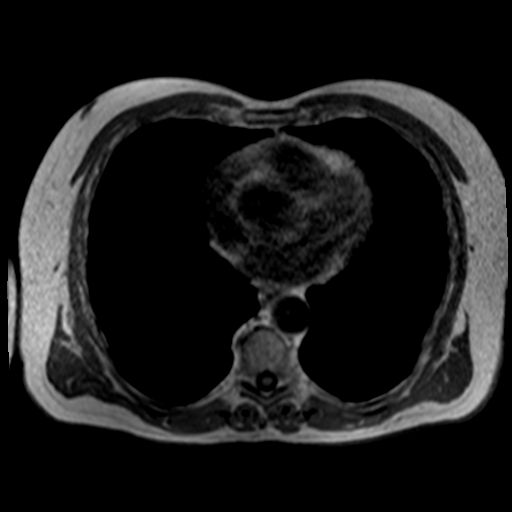

[Series 6: bSSFP · axial · 5.5mm · 0.70mm/px · 1 of 35 slices shown]
[im 1/35]
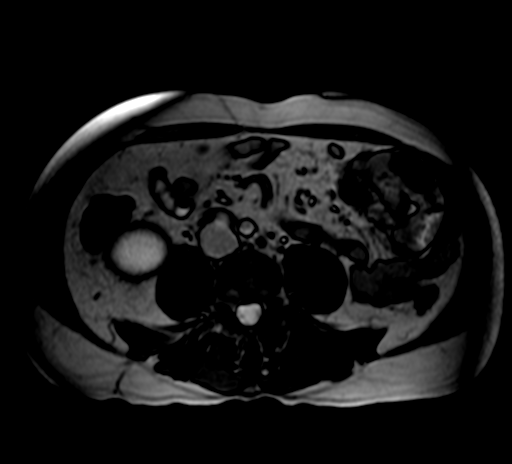

[Series 9: T2 · axial · 5.0mm · 0.94mm/px · 1 of 35 slices shown]
[im 1/35]
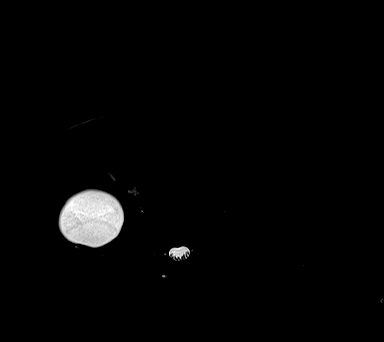

[Series 12: MRCP · sagittal · 0.89mm/px · 1 of 19 slices shown (1 of 2)]
[im 1/19]
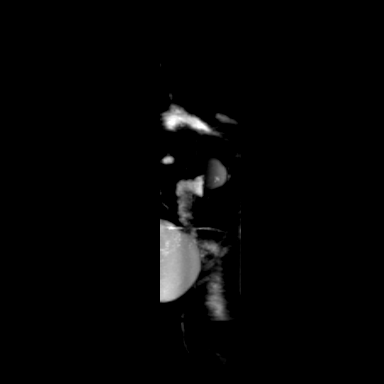

[Series 13: MRCP · coronal · 0.89mm/px · 1 of 6 slices shown (2 of 2)]
[im 1/6]
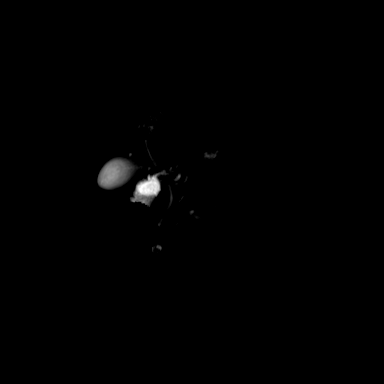

[Series 15: [id number]_trig · axial · 5.0mm · 1.88mm/px · z∈[-51,+161]mm · 4 of 105 slices shown]
[im 1/105]
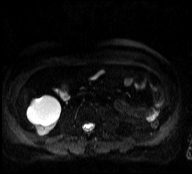
[im 35/105]
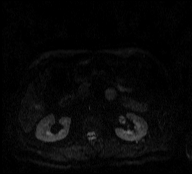
[im 70/105]
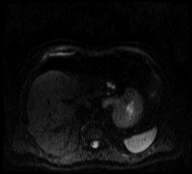
[im 105/105]
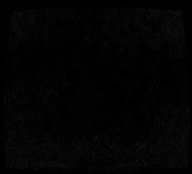

[Series 16: [id number]_trig_adc · axial · 5.0mm · 1.88mm/px · 1 of 35 slices shown]
[im 1/35]
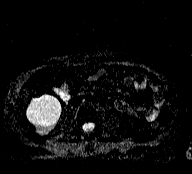

[Series 17: T1 dynamic · axial · non-contrast · 2.5mm · 0.70mm/px · z∈[-90,+128]mm · 3 of 88 slices shown]
[im 1/88]
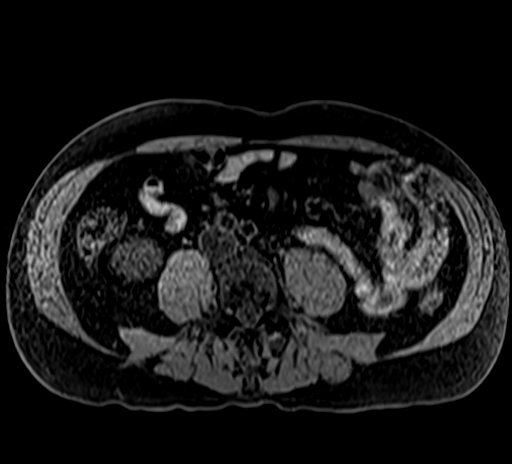
[im 44/88]
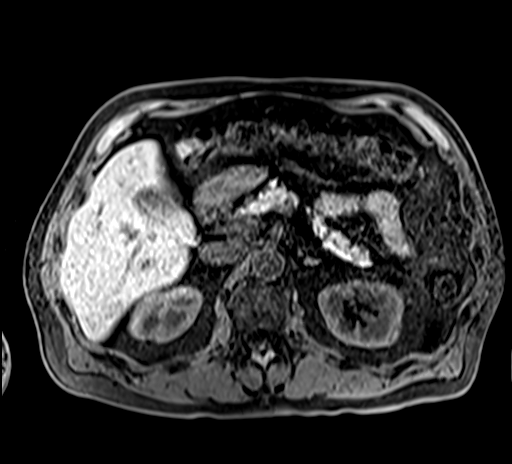
[im 88/88]
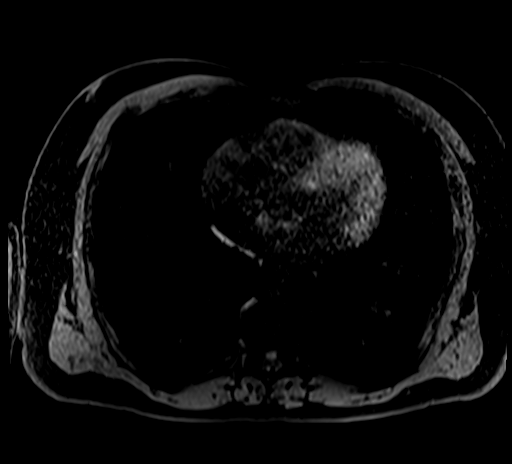

[Series 18: T1 dynamic post-contrast · axial · 2.5mm · 0.70mm/px · z∈[-90,+128]mm · 3 of 88 slices shown (1 of 2)]
[im 1/88]
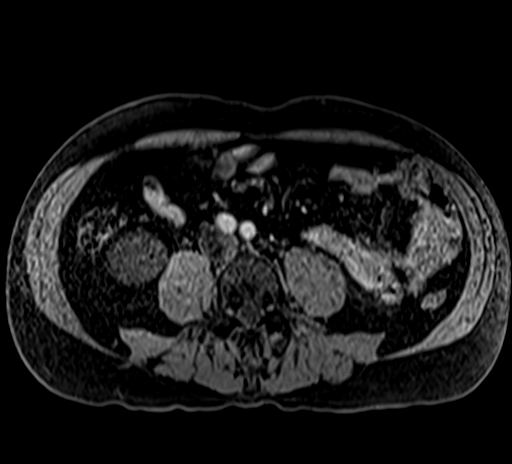
[im 44/88]
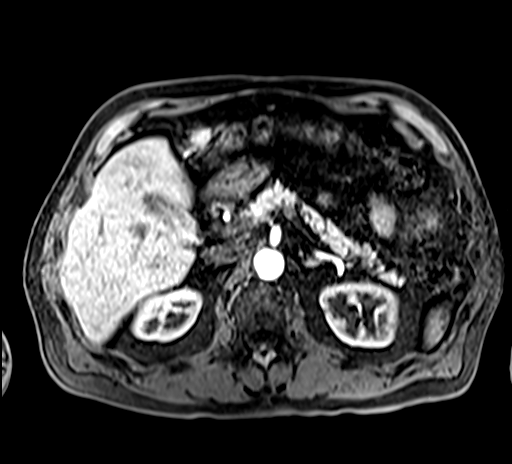
[im 88/88]
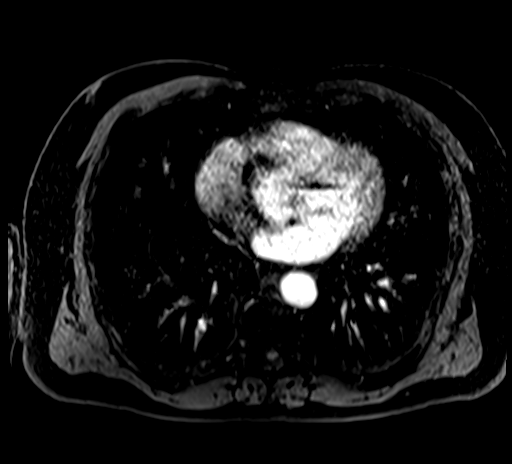

[Series 19: T1 dynamic post-contrast · axial · 2.5mm · 0.70mm/px · z∈[-90,+128]mm · 3 of 88 slices shown (2 of 2)]
[im 1/88]
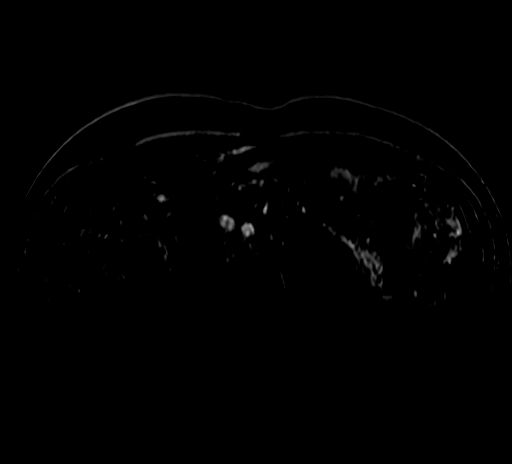
[im 44/88]
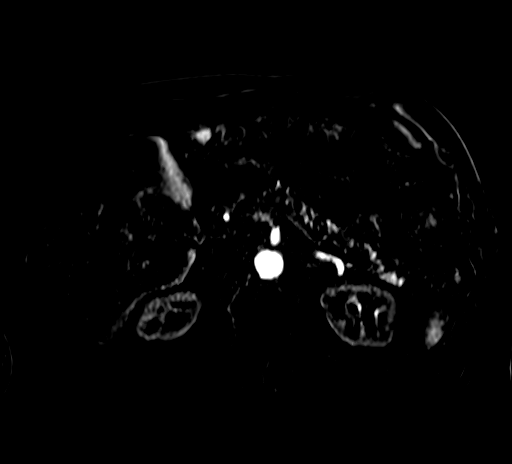
[im 88/88]
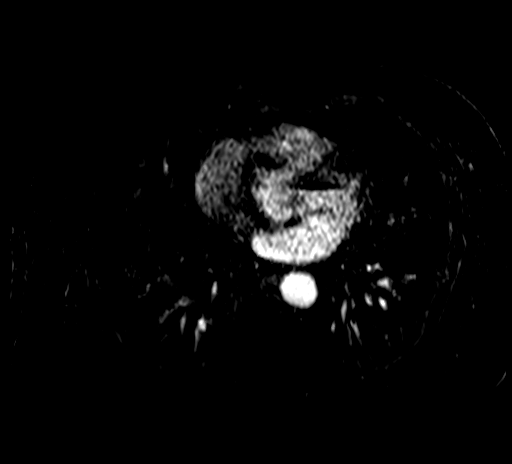

[25 of 48 positions shown; findings below may reference images not displayed]

FINDINGS: Lower chest: No acute findings.

Hepatobiliary: Several small hepatic cysts are noted in both lobes.
No liver masses are identified. Gallbladder is unremarkable. No
evidence of biliary ductal dilatation.

Pancreas: A 9 mm simple appearing cyst is seen in the pancreatic
tail on image [DATE]. This is better seen on today's study, but was
present on previous CT of [DATE]. No other pancreatic lesions
identified. No evidence of pancreatic ductal dilatation or pancreas
divisum.

Spleen:  Within normal limits in size and appearance.

Adrenals/Urinary Tract: No masses identified. Benign-appearing right
renal cyst noted. No evidence of hydronephrosis.

Stomach/Bowel: Visualized portion unremarkable.

Vascular/Lymphatic: No pathologically enlarged lymph nodes
identified. No abdominal aortic aneurysm.

Other:  None.

Musculoskeletal:  No suspicious bone lesions identified.
IMPRESSION: 9 mm simple appearing cystic lesion in the pancreatic tail is better
visualized by MRI, but was present on previous CT of [DATE].
This is suspicious for an indolent cystic pancreatic neoplasm such
as a side-branch IPMN. Recommend continued followup by abdomen MRI
without and with contrast in 2 years. This recommendation follows
ACR consensus guidelines: Management of Incidental Pancreatic Cysts:
A White Paper of the ACR Incidental Findings Committee. [HOSPITAL] [7P];[DATE].

## 2019-05-21 MED ORDER — GADOBENATE DIMEGLUMINE 529 MG/ML IV SOLN
15.0000 mL | Freq: Once | INTRAVENOUS | Status: AC | PRN
  Administered 2019-05-21: 15 mL via INTRAVENOUS

## 2019-06-24 NOTE — Progress Notes (Signed)
Subjective:   Nicholas Murillo is a 73 y.o. male who presents for Medicare Annual/Subsequent preventive examination.  Review of Systems:  No ROS.  Medicare Wellness Virtual Visit.  Visual/audio telehealth visit, UTA vital signs.   See social history for additional risk factors.    Cardiac Risk Factors include: advanced age (>61men, >70 women);hypertension;male gender  Sleep patterns: Getting on average 8 hours of sleep a night.Wakes up 1 time to void. Wakes up and feels rested and ready fopr the day.  Home Safety/Smoke Alarms: Feels safe in home. Smoke alarms in place.  Living environment; Lives with significant other in 3 story home. Stairs have hand rails on them. Shower is a step over tub combo and no grab bars in place. Seat Belt Safety/Bike Helmet: Wears seat belt.    Male:   CCS- UTD     PSA- UTD Lab Results  Component Value Date   PSA 4.1 (H) 10/31/2018   PSA 3.69 08/10/2017   PSA 5.43 03/17/2017        Objective:    Vitals: BP 128/60   Pulse 63   Ht 5\' 10"  (1.778 m)   Wt 177 lb (80.3 kg)   SpO2 98%   BMI 25.40 kg/m   Body mass index is 25.4 kg/m.  Advanced Directives 07/08/2019 07/02/2018 12/16/2017 06/22/2015  Does Patient Have a Medical Advance Directive? No No No No  Would patient like information on creating a medical advance directive? No - Patient declined No - Patient declined No - Patient declined -    Tobacco Social History   Tobacco Use  Smoking Status Former Smoker  Smokeless Tobacco Never Used     Counseling given: No   Clinical Intake:  Pre-visit preparation completed: Yes  Pain : No/denies pain     Nutritional Risks: None Diabetes: No  How often do you need to have someone help you when you read instructions, pamphlets, or other written materials from your doctor or pharmacy?: 1 - Never What is the last grade level you completed in school?: 16  Interpreter Needed?: No  Information entered by :: [name], LPN  Past Medical  History:  Diagnosis Date  . Aortic valve regurgitation 11/28/2018   Moderate echocardiogram May 2020  . Arthritis   . Esophageal stricture   . GERD (gastroesophageal reflux disease)   . Hiatal hernia   . Hyperlipidemia   . Hypertension    Past Surgical History:  Procedure Laterality Date  . APPENDECTOMY  1960  . arterial catheter  1997  . UPPER GASTROINTESTINAL ENDOSCOPY     Family History  Problem Relation Age of Onset  . Skin cancer Father   . Colon polyps Mother        benign  . Dementia Mother   . Colon cancer Neg Hx   . Stomach cancer Neg Hx   . Esophageal cancer Neg Hx   . Rectal cancer Neg Hx    Social History   Socioeconomic History  . Marital status: Significant Other    Spouse name: [name]  . Number of children: 2  . Years of education: 59  . Highest education level: Master's degree (e.g., MA, MS, MEng, MEd, MSW, MBA)  Occupational History  . Occupation: [employer]    Comment: retired  Tobacco Use  . Smoking status: Former Research scientist (life sciences)  . Smokeless tobacco: Never Used  Substance and Sexual Activity  . Alcohol use: No    Alcohol/week: 0.0 standard drinks    Comment: quit 20 years ago  .  Drug use: No    Comment: quit 1994 (cocaine,pot,"anything could get hands on")  . Sexual activity: Yes  Other Topics Concern  . 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 Drinks 2 cupsa of caffeine during the day.   Social Determinants of Health   Financial Resource Strain:   . Difficulty of Paying Living Expenses: Not on file  Food Insecurity:   . Worried About Charity fundraiser in the Last Year: Not on file  . Ran Out of Food in the Last Year: Not on file  Transportation Needs:   . Lack of Transportation (Medical): Not on file  . Lack of Transportation (Non-Medical): Not on file  Physical Activity:   . Days of Exercise per Week: Not on file  . Minutes of Exercise per Session: Not on  file  Stress:   . Feeling of Stress : Not on file  Social Connections:   . Frequency of Communication with Friends and Family: Not on file  . Frequency of Social Gatherings with Friends and Family: Not on file  . Attends Religious Services: Not on file  . Active Member of Clubs or Organizations: Not on file  . Attends Archivist Meetings: Not on file  . Marital Status: Not on file    Outpatient Encounter Medications as of 07/08/2019  Medication Sig  . aspirin EC 81 MG tablet Take 81 mg by mouth daily.  Marland Kitchen EPINEPHrine (EPIPEN 2-PAK) 0.3 mg/0.3 mL IJ SOAJ injection Inject 0.3 mLs (0.3 mg total) into the muscle once.  Marland Kitchen losartan (COZAAR) 100 MG tablet Take 1 tablet (100 mg total) by mouth daily.  . magnesium oxide (MAG-OX) 400 MG tablet Take 800 mg by mouth daily.  . metoprolol succinate (TOPROL-XL) 25 MG 24 hr tablet Take 1 tablet (25 mg total) by mouth daily.  . polyethylene glycol (MIRALAX) packet Take 17 g by mouth daily.  . simvastatin (ZOCOR) 20 MG tablet Take 1 tablet (20 mg total) by mouth daily.   No facility-administered encounter medications on file as of 07/08/2019.    Activities of Daily Living In your present state of health, do you have any difficulty performing the following activities: 07/08/2019  Hearing? Y  Comment left ear hearing loss  Vision? Y  Difficulty concentrating or making decisions? N  Walking or climbing stairs? N  Dressing or bathing? N  Doing errands, shopping? N  Preparing Food and eating ? N  Using the Toilet? N  In the past six months, have you accidently leaked urine? N  Do you have problems with loss of bowel control? N  Managing your Medications? N  Managing your Finances? N  Housekeeping or managing your Housekeeping? N  Some recent data might be hidden    Patient Care Team: Gregor Hams, MD as PCP - General (Family Medicine)   Assessment:   This is a routine wellness examination for Nicholas Murillo.Physical assessment deferred to  PCP.   Exercise Activities and Dietary recommendations Current Exercise Habits: The patient does not participate in regular exercise at present(works his land and cutting wood), Exercise limited by: None identified Diet  Eats a healthy diet of vegetables, fruits and proteins. Breakfast: cereal with banana, yogurt and V8 juice. Lunch: sandwich Dinner: Meat and vegetables Drinks 64 ounces of water daily      Goals    . DIET - INCREASE WATER INTAKE  Wants to increase water intake up to the 64 ounces daily    . Patient Stated     Patient stated he would like to be able to increase his ability to communicate with others again and not isolate hisself       Fall Risk Fall Risk  07/08/2019 10/30/2018 07/02/2018 02/12/2018 11/09/2017  Falls in the past year? 0 0 0 No No  Comment - - - - -  Number falls in past yr: 0 0 - - -  Injury with Fall? 0 0 - - -  Follow up Falls prevention discussed Falls evaluation completed Falls prevention discussed - -   Is the patient's home free of loose throw rugs in walkways, pet beds, electrical cords, etc?   yes      Grab bars in the bathroom? no      Handrails on the stairs?   yes      Adequate lighting?   yes    Depression Screen PHQ 2/9 Scores 07/08/2019 07/02/2018 03/12/2018 02/12/2018  PHQ - 2 Score 0 0 2 5  PHQ- 9 Score - - 3 11    Cognitive Function     6CIT Screen 07/08/2019 07/02/2018  What Year? 0 points 0 points  What month? 0 points 0 points  What time? 0 points 0 points  Count back from 20 0 points 0 points  Months in reverse 0 points 0 points  Repeat phrase 0 points 2 points  Total Score 0 2    Immunization History  Administered Date(s) Administered  . Fluad Quad(high Dose 65+) 03/06/2019  . Influenza,inj,Quad PF,6+ Mos 03/13/2018  . Pneumococcal Conjugate-13 09/06/2016  . Pneumococcal Polysaccharide-23 07/18/2012  . Tdap 02/07/2013  . Zoster 09/03/2012    Screening Tests Health Maintenance  Topic Date Due  . Fecal DNA  (Cologuard)  10/27/2018  . TETANUS/TDAP  02/08/2023  . INFLUENZA VACCINE  Completed  . Hepatitis C Screening  Completed  . PNA vac Low Risk Adult  Completed        Plan:    Please schedule your next medicare wellness visit with me in 1 yr.  Nicholas Murillo , Thank you for taking time to come for your Medicare Wellness Visit. I appreciate your ongoing commitment to your health goals. Please review the following plan we discussed and let me know if I can assist you in the future.  Continue doing brain stimulating activities (puzzles, reading, adult coloring books, staying active) to keep memory sharp.    These are the goals we discussed: Goals    . DIET - INCREASE WATER INTAKE     Wants to increase water intake up to the 64 ounces daily    . Patient Stated     Patient stated he would like to be able to increase his ability to communicate with others again and not isolate hisself       This is a list of the screening recommended for you and due dates:  Health Maintenance  Topic Date Due  . Cologuard (Stool DNA test)  10/27/2018  . Tetanus Vaccine  02/08/2023  . Flu Shot  Completed  .  Hepatitis C: One time screening is recommended by Center for Disease Control  (CDC) for  adults born from [birth year] through 1965.   Completed  . Pneumonia vaccines  Completed     I have personally reviewed and noted the following in the patient's chart:   . Medical and social history . Use of alcohol, tobacco or  illicit drugs  . Current medications and supplements . Functional ability and status . Nutritional status . Physical activity . Advanced directives . List of other physicians . Hospitalizations, surgeries, and ER visits in previous 12 months . Vitals . Screenings to include cognitive, depression, and falls . Referrals and appointments  In addition, I have reviewed and discussed with patient certain preventive protocols, quality metrics, and best practice recommendations. A written  personalized care plan for preventive services as well as general preventive health recommendations were provided to patient.     Joanne Chars, LPN  [id number]

## 2019-07-08 ENCOUNTER — Other Ambulatory Visit: Payer: Self-pay

## 2019-07-08 ENCOUNTER — Ambulatory Visit (INDEPENDENT_AMBULATORY_CARE_PROVIDER_SITE_OTHER): Payer: Medicare Other | Admitting: *Deleted

## 2019-07-08 VITALS — BP 128/60 | HR 63 | Ht 70.0 in | Wt 177.0 lb

## 2019-07-08 DIAGNOSIS — Z0000 Encounter for general adult medical examination without abnormal findings: Secondary | ICD-10-CM

## 2019-07-08 NOTE — Patient Instructions (Addendum)
Please schedule your next medicare wellness visit with me in 1 yr.  Nicholas Murillo , Thank you for taking time to come for your Medicare Wellness Visit. I appreciate your ongoing commitment to your health goals. Please review the following plan we discussed and let me know if I can assist you in the future.  Continue doing brain stimulating activities (puzzles, reading, adult coloring books, staying active) to keep memory sharp.  These are the goals we discussed: Goals    . DIET - INCREASE WATER INTAKE     Wants to increase water intake up to the 64 ounces daily    . Patient Stated     Patient stated Nicholas Murillo would like to be able to increase Nicholas Murillo ability to communicate with others again and not isolate hisself

## 2019-08-12 ENCOUNTER — Other Ambulatory Visit: Payer: Self-pay | Admitting: Cardiology

## 2019-08-12 MED ORDER — METOPROLOL SUCCINATE ER 25 MG PO TB24: 25.0000 mg | ORAL_TABLET | Freq: Every day | ORAL | 1 refills | Status: DC

## 2019-08-12 NOTE — Telephone Encounter (Signed)
*  STAT* If patient is at the pharmacy, call can be transferred to refill team.   1. Which medications need to be refilled? (please list name of each medication and dose if known) metoprolol succinate (TOPROL-XL) 25 MG 24 hr tablet  2. Which pharmacy/location (including street and city if local pharmacy) is medication to be sent to? [place], [place]  3. Do they need a 30 day or 90 day supply? [place]

## 2019-08-12 NOTE — Telephone Encounter (Signed)
Rx has been sent to the pharmacy electronically. ° °

## 2019-08-15 ENCOUNTER — Encounter: Payer: Self-pay | Admitting: Family Medicine

## 2019-08-15 ENCOUNTER — Ambulatory Visit (INDEPENDENT_AMBULATORY_CARE_PROVIDER_SITE_OTHER): Payer: Medicare Other | Admitting: Family Medicine

## 2019-08-15 ENCOUNTER — Other Ambulatory Visit: Payer: Self-pay

## 2019-08-15 VITALS — BP 138/86 | HR 64 | Temp 98.2°F | Ht 70.0 in | Wt 185.0 lb

## 2019-08-15 DIAGNOSIS — Z0000 Encounter for general adult medical examination without abnormal findings: Secondary | ICD-10-CM

## 2019-08-15 DIAGNOSIS — R739 Hyperglycemia, unspecified: Secondary | ICD-10-CM | POA: Diagnosis not present

## 2019-08-15 DIAGNOSIS — I10 Essential (primary) hypertension: Secondary | ICD-10-CM

## 2019-08-15 DIAGNOSIS — E785 Hyperlipidemia, unspecified: Secondary | ICD-10-CM | POA: Diagnosis not present

## 2019-08-15 DIAGNOSIS — E782 Mixed hyperlipidemia: Secondary | ICD-10-CM

## 2019-08-15 DIAGNOSIS — D485 Neoplasm of uncertain behavior of skin: Secondary | ICD-10-CM

## 2019-08-15 MED ORDER — SIMVASTATIN 20 MG PO TABS: 20.0000 mg | ORAL_TABLET | Freq: Every day | ORAL | 3 refills | Status: DC

## 2019-08-15 NOTE — Assessment & Plan Note (Addendum)
Lab Results  Component Value Date   LDLCALC 84 10/31/2018  Continue simvastatin at current dose.  Simvastatin refilled.  Update labs at next follow up visit.

## 2019-08-15 NOTE — Assessment & Plan Note (Signed)
History of significant sun exposure in early life.  Family history of metastatic skin cancer Referral to dermatology for suspicious lesion to R cheek area.

## 2019-08-15 NOTE — Assessment & Plan Note (Signed)
Blood pressure is at goal at for age and co-morbidities.  I recommend he continue current medications.  In addition they were instructed to follow a low sodium diet with regular exercise to help to maintain adequate control of blood pressure.   

## 2019-08-15 NOTE — Patient Instructions (Addendum)
Great to meet you today! I have entered a referral for you to see dermatology, you will be contacted with appt.  Follow up with me in 6 months.   DASH Eating Plan DASH stands for "Dietary Approaches to Stop Hypertension." The DASH eating plan is a healthy eating plan that has been shown to reduce high blood pressure (hypertension). It may also reduce your risk for type 2 diabetes, heart disease, and stroke. The DASH eating plan may also help with weight loss. What are tips for following this plan?  General guidelines  Avoid eating more than 2,300 mg (milligrams) of salt (sodium) a day. If you have hypertension, you may need to reduce your sodium intake to 1,500 mg a day.  Limit alcohol intake to no more than 1 drink a day for nonpregnant women and 2 drinks a day for men. One drink equals 12 oz of beer, 5 oz of wine, or 1 oz of hard liquor.  Work with your health care provider to maintain a healthy body weight or to lose weight. Ask what an ideal weight is for you.  Get at least 30 minutes of exercise that causes your heart to beat faster (aerobic exercise) most days of the week. Activities may include walking, swimming, or biking.  Work with your health care provider or diet and nutrition specialist (dietitian) to adjust your eating plan to your individual calorie needs. Reading food labels   Check food labels for the amount of sodium per serving. Choose foods with less than 5 percent of the Daily Value of sodium. Generally, foods with less than 300 mg of sodium per serving fit into this eating plan.  To find whole grains, look for the word "whole" as the first word in the ingredient list. Shopping  Buy products labeled as "low-sodium" or "no salt added."  Buy fresh foods. Avoid canned foods and premade or frozen meals. Cooking  Avoid adding salt when cooking. Use salt-free seasonings or herbs instead of table salt or sea salt. Check with your health care provider or pharmacist  before using salt substitutes.  Do not fry foods. Cook foods using healthy methods such as baking, boiling, grilling, and broiling instead.  Cook with heart-healthy oils, such as olive, canola, soybean, or sunflower oil. Meal planning  Eat a balanced diet that includes: ? 5 or more servings of fruits and vegetables each day. At each meal, try to fill half of your plate with fruits and vegetables. ? Up to 6-8 servings of whole grains each day. ? Less than 6 oz of lean meat, poultry, or fish each day. A 3-oz serving of meat is about the same size as a deck of cards. One egg equals 1 oz. ? 2 servings of low-fat dairy each day. ? A serving of nuts, seeds, or beans 5 times each week. ? Heart-healthy fats. Healthy fats called Omega-3 fatty acids are found in foods such as flaxseeds and coldwater fish, like sardines, salmon, and mackerel.  Limit how much you eat of the following: ? Canned or prepackaged foods. ? Food that is high in trans fat, such as fried foods. ? Food that is high in saturated fat, such as fatty meat. ? Sweets, desserts, sugary drinks, and other foods with added sugar. ? Full-fat dairy products.  Do not salt foods before eating.  Try to eat at least 2 vegetarian meals each week.  Eat more home-cooked food and less restaurant, buffet, and fast food.  When eating at a restaurant, ask  that your food be prepared with less salt or no salt, if possible. What foods are recommended? The items listed may not be a complete list. Talk with your dietitian about what dietary choices are best for you. Grains Whole-grain or whole-wheat bread. Whole-grain or whole-wheat pasta. Brown rice. [place]. Bulgur. Whole-grain and low-sodium cereals. Pita bread. Low-fat, low-sodium crackers. Whole-wheat flour tortillas. Vegetables Fresh or frozen vegetables (raw, steamed, roasted, or grilled). Low-sodium or reduced-sodium tomato and vegetable juice. Low-sodium or reduced-sodium tomato  sauce and tomato paste. Low-sodium or reduced-sodium canned vegetables. Fruits All fresh, dried, or frozen fruit. Canned fruit in natural juice (without added sugar). Meat and other protein foods Skinless chicken or [place]. Ground chicken or [place]. Pork with fat trimmed off. Fish and seafood. Egg whites. Dried beans, peas, or lentils. Unsalted nuts, nut butters, and seeds. Unsalted canned beans. Lean cuts of beef with fat trimmed off. Low-sodium, lean deli meat. Dairy Low-fat (1%) or fat-free (skim) milk. Fat-free, low-fat, or reduced-fat cheeses. Nonfat, low-sodium ricotta or cottage cheese. Low-fat or nonfat yogurt. Low-fat, low-sodium cheese. Fats and oils Soft margarine without trans fats. Vegetable oil. Low-fat, reduced-fat, or light mayonnaise and salad dressings (reduced-sodium). Canola, safflower, olive, soybean, and sunflower oils. Avocado. Seasoning and other foods Herbs. Spices. Seasoning mixes without salt. Unsalted popcorn and pretzels. Fat-free sweets. What foods are not recommended? The items listed may not be a complete list. Talk with your dietitian about what dietary choices are best for you. Grains Baked goods made with fat, such as croissants, muffins, or some breads. Dry pasta or rice meal packs. Vegetables Creamed or fried vegetables. Vegetables in a cheese sauce. Regular canned vegetables (not low-sodium or reduced-sodium). Regular canned tomato sauce and paste (not low-sodium or reduced-sodium). Regular tomato and vegetable juice (not low-sodium or reduced-sodium). Angie Fava. Olives. Fruits Canned fruit in a light or heavy syrup. Fried fruit. Fruit in cream or butter sauce. Meat and other protein foods Fatty cuts of meat. Ribs. Fried meat. Berniece Salines. Sausage. Bologna and other processed lunch meats. Salami. Fatback. Hotdogs. Bratwurst. Salted nuts and seeds. Canned beans with added salt. Canned or smoked fish. Whole eggs or egg yolks. Chicken or [place] with skin. Dairy Whole  or 2% milk, cream, and half-and-half. Whole or full-fat cream cheese. Whole-fat or sweetened yogurt. Full-fat cheese. Nondairy creamers. Whipped toppings. Processed cheese and cheese spreads. Fats and oils Butter. Stick margarine. Lard. Shortening. Ghee. Bacon fat. Tropical oils, such as coconut, palm kernel, or palm oil. Seasoning and other foods Salted popcorn and pretzels. Onion salt, garlic salt, seasoned salt, table salt, and sea salt. Worcestershire sauce. Tartar sauce. Barbecue sauce. Teriyaki sauce. Soy sauce, including reduced-sodium. Steak sauce. Canned and packaged gravies. Fish sauce. Oyster sauce. Cocktail sauce. Horseradish that you find on the shelf. Ketchup. Mustard. Meat flavorings and tenderizers. Bouillon cubes. Hot sauce and Tabasco sauce. Premade or packaged marinades. Premade or packaged taco seasonings. Relishes. Regular salad dressings. Where to find more information:  National Heart, Lung, and [place]: https://wilson-eaton.com/  American Heart Association: www.heart.org Summary  The DASH eating plan is a healthy eating plan that has been shown to reduce high blood pressure (hypertension). It may also reduce your risk for type 2 diabetes, heart disease, and stroke.  With the DASH eating plan, you should limit salt (sodium) intake to 2,300 mg a day. If you have hypertension, you may need to reduce your sodium intake to 1,500 mg a day.  When on the DASH eating plan, aim to eat more fresh fruits  and vegetables, whole grains, lean proteins, low-fat dairy, and heart-healthy fats.  Work with your health care provider or diet and nutrition specialist (dietitian) to adjust your eating plan to your individual calorie needs. This information is not intended to replace advice given to you by your health care provider. Make sure you discuss any questions you have with your health care provider. Document Revised: 06/02/2017 Document Reviewed: 06/13/2016 Elsevier Patient Education   2020 [employer].

## 2019-08-15 NOTE — Progress Notes (Signed)
Nicholas Murillo - 74 y.o. male MRN [id number]  Date of birth: October 19, 1945  Subjective Chief Complaint  Patient presents with  . Hypertension    HPI Nicholas Murillo is a 74 y.o. male with history of HTN, HLD, and GERD here today for follow up.  He also has concern regarding skin lesion on his face.  Reports father died from metastatic SCC of the skin.  Skin lesion on face present for about 6 months.  Mild itching.  Denies bleeding. He is a retired Secondary school teacher, previously lived in the [place] for 31 years.  He reports rarely wearing sunscreen.    -HTN:  Current management with losartan and toprol-xl.  He is followed by cardiology (Dr. Stanford Breed) as well.  He is compliant with medications.  He stays fairly active and feels that he follows a healthy diet.   -HLD:  Doing well with simvastatin. Denies myalgias.  ROS:  A comprehensive ROS was completed and negative except as noted per HPI  No Known Allergies  Past Medical History:  Diagnosis Date  . Aortic valve regurgitation 11/28/2018   Moderate echocardiogram May 2020  . Arthritis   . Esophageal stricture   . GERD (gastroesophageal reflux disease)   . Hiatal hernia   . Hyperlipidemia   . Hypertension     Past Surgical History:  Procedure Laterality Date  . APPENDECTOMY  1960  . arterial catheter  1997  . UPPER GASTROINTESTINAL ENDOSCOPY      Social History   Socioeconomic History  . Marital status: Significant Other    Spouse name: [name]  . Number of children: 2  . Years of education: 15  . Highest education level: Master's degree (e.g., MA, MS, MEng, MEd, MSW, MBA)  Occupational History  . Occupation: [employer]    Comment: retired  Tobacco Use  . Smoking status: Former Research scientist (life sciences)  . Smokeless tobacco: Never Used  Substance and Sexual Activity  . Alcohol use: No    Alcohol/week: 0.0 standard drinks    Comment: quit 20 years ago  . Drug use: No    Comment: quit 1994 (cocaine,pot,"anything could get hands on")  .  Sexual activity: Yes  Other Topics Concern  . 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 Drinks 2 cupsa of caffeine during the day.   Social Determinants of Health   Financial Resource Strain:   . Difficulty of Paying Living Expenses: Not on file  Food Insecurity:   . Worried About Charity fundraiser in the Last Year: Not on file  . Ran Out of Food in the Last Year: Not on file  Transportation Needs:   . Lack of Transportation (Medical): Not on file  . Lack of Transportation (Non-Medical): Not on file  Physical Activity:   . Days of Exercise per Week: Not on file  . Minutes of Exercise per Session: Not on file  Stress:   . Feeling of Stress : Not on file  Social Connections:   . Frequency of Communication with Friends and Family: Not on file  . Frequency of Social Gatherings with Friends and Family: Not on file  . Attends Religious Services: Not on file  . Active Member of Clubs or Organizations: Not on file  . Attends Archivist Meetings: Not on file  . Marital Status: Not on file    Family History  Problem Relation Age of  Onset  . Skin cancer Father   . Colon polyps Mother        benign  . Dementia Mother   . Colon cancer Neg Hx   . Stomach cancer Neg Hx   . Esophageal cancer Neg Hx   . Rectal cancer Neg Hx     Health Maintenance  Topic Date Due  . Fecal DNA (Cologuard)  10/27/2018  . TETANUS/TDAP  02/08/2023  . INFLUENZA VACCINE  Completed  . Hepatitis C Screening  Completed  . PNA vac Low Risk Adult  Completed    ----------------------------------------------------------------------------------------------------------------------------------------------------------------------------------------------------------------- Physical Exam BP 138/86   Pulse 64   Temp 98.2 F (36.8 C) (Oral)   Ht 5\' 10"  (1.778 m)   Wt 185 lb (83.9 kg)   BMI 26.54 kg/m   Physical  Exam Constitutional:      Appearance: Normal appearance.  HENT:     Head: Normocephalic and atraumatic.  Eyes:     General: No scleral icterus. Cardiovascular:     Rate and Rhythm: Normal rate and regular rhythm.  Pulmonary:     Effort: Pulmonary effort is normal.     Breath sounds: Normal breath sounds.  Musculoskeletal:     Cervical back: Neck supple.  Skin:    Comments: Telangiectasias along bilateral cheeks.  Scaly, ulcerated area to R cheek.    Neurological:     General: No focal deficit present.     Mental Status: He is alert.  Psychiatric:        Mood and Affect: Mood normal.        Behavior: Behavior normal.     ------------------------------------------------------------------------------------------------------------------------------------------------------------------------------------------------------------------- Assessment and Plan  Essential hypertension, benign Blood pressure is at goal at for age and co-morbidities.  I recommend he continue current medications.  In addition they were instructed to follow a low sodium diet with regular exercise to help to maintain adequate control of blood pressure.    Hyperlipidemia Lab Results  Component Value Date   LDLCALC 84 10/31/2018  Continue simvastatin at current dose.  Simvastatin refilled.  Update labs at next follow up visit.    Neoplasm of uncertain behavior of skin of face History of significant sun exposure in early life.  Family history of metastatic skin cancer Referral to dermatology for suspicious lesion to R cheek area.     This visit occurred during the SARS-CoV-2 public health emergency.  Safety protocols were in place, including screening questions prior to the visit, additional usage of staff PPE, and extensive cleaning of exam room while observing appropriate contact time as indicated for disinfecting solutions.

## 2019-08-16 ENCOUNTER — Ambulatory Visit: Payer: Medicare Other

## 2019-08-21 DIAGNOSIS — C44329 Squamous cell carcinoma of skin of other parts of face: Secondary | ICD-10-CM | POA: Diagnosis not present

## 2019-08-21 DIAGNOSIS — L570 Actinic keratosis: Secondary | ICD-10-CM | POA: Diagnosis not present

## 2019-08-21 DIAGNOSIS — L578 Other skin changes due to chronic exposure to nonionizing radiation: Secondary | ICD-10-CM | POA: Diagnosis not present

## 2019-08-21 DIAGNOSIS — L821 Other seborrheic keratosis: Secondary | ICD-10-CM | POA: Diagnosis not present

## 2019-09-04 DIAGNOSIS — R972 Elevated prostate specific antigen [PSA]: Secondary | ICD-10-CM | POA: Diagnosis not present

## 2019-09-10 DIAGNOSIS — N400 Enlarged prostate without lower urinary tract symptoms: Secondary | ICD-10-CM | POA: Diagnosis not present

## 2019-09-10 DIAGNOSIS — R972 Elevated prostate specific antigen [PSA]: Secondary | ICD-10-CM | POA: Diagnosis not present

## 2019-11-11 ENCOUNTER — Telehealth: Payer: Self-pay | Admitting: Cardiology

## 2019-11-11 NOTE — Telephone Encounter (Signed)
New message   *STAT* If patient is at the pharmacy, call can be transferred to refill team.   1. Which medications need to be refilled? (please list name of each medication and dose if known)metoprolol succinate (TOPROL-XL) 25 MG 24 hr tablet  2. Which pharmacy/location (including street and city if local pharmacy) is medication to be sent to?[place], [place]   3. Do they need a 30 day or 90 day supply? [place]

## 2019-11-13 MED ORDER — METOPROLOL SUCCINATE ER 25 MG PO TB24: 25.0000 mg | ORAL_TABLET | Freq: Every day | ORAL | 1 refills | Status: DC

## 2019-11-19 ENCOUNTER — Telehealth: Payer: Self-pay | Admitting: *Deleted

## 2019-11-19 DIAGNOSIS — I351 Nonrheumatic aortic (valve) insufficiency: Secondary | ICD-10-CM

## 2019-11-19 NOTE — Telephone Encounter (Signed)
Left message for pt to call to schedule follow up echo for aortic valve disease. Order placed

## 2019-12-03 NOTE — Telephone Encounter (Signed)
Echo scheduled.

## 2019-12-04 ENCOUNTER — Ambulatory Visit (HOSPITAL_BASED_OUTPATIENT_CLINIC_OR_DEPARTMENT_OTHER)
Admission: RE | Admit: 2019-12-04 | Discharge: 2019-12-04 | Disposition: A | Discharge status: Home / Self Care | Payer: Medicare Other | Source: Ambulatory Visit | Attending: Cardiology | Admitting: Cardiology

## 2019-12-04 ENCOUNTER — Other Ambulatory Visit: Payer: Self-pay

## 2019-12-04 ENCOUNTER — Other Ambulatory Visit (HOSPITAL_BASED_OUTPATIENT_CLINIC_OR_DEPARTMENT_OTHER): Payer: Self-pay | Admitting: Orthopaedic Surgery

## 2019-12-04 DIAGNOSIS — I351 Nonrheumatic aortic (valve) insufficiency: Secondary | ICD-10-CM

## 2019-12-11 DIAGNOSIS — H5203 Hypermetropia, bilateral: Secondary | ICD-10-CM | POA: Diagnosis not present

## 2019-12-11 DIAGNOSIS — H524 Presbyopia: Secondary | ICD-10-CM | POA: Diagnosis not present

## 2019-12-11 DIAGNOSIS — H2513 Age-related nuclear cataract, bilateral: Secondary | ICD-10-CM | POA: Diagnosis not present

## 2020-02-12 ENCOUNTER — Ambulatory Visit: Payer: Medicare Other | Admitting: Family Medicine

## 2020-02-19 DIAGNOSIS — L578 Other skin changes due to chronic exposure to nonionizing radiation: Secondary | ICD-10-CM | POA: Diagnosis not present

## 2020-02-19 DIAGNOSIS — L570 Actinic keratosis: Secondary | ICD-10-CM | POA: Diagnosis not present

## 2020-02-19 DIAGNOSIS — L821 Other seborrheic keratosis: Secondary | ICD-10-CM | POA: Diagnosis not present

## 2020-02-20 ENCOUNTER — Encounter: Payer: Self-pay | Admitting: Family Medicine

## 2020-02-20 ENCOUNTER — Ambulatory Visit (INDEPENDENT_AMBULATORY_CARE_PROVIDER_SITE_OTHER): Payer: Medicare Other | Admitting: Family Medicine

## 2020-02-20 VITALS — BP 115/56 | HR 85 | Temp 98.1°F | Ht 70.08 in | Wt 166.0 lb

## 2020-02-20 DIAGNOSIS — Z0000 Encounter for general adult medical examination without abnormal findings: Secondary | ICD-10-CM | POA: Diagnosis not present

## 2020-02-20 DIAGNOSIS — R972 Elevated prostate specific antigen [PSA]: Secondary | ICD-10-CM

## 2020-02-20 DIAGNOSIS — E785 Hyperlipidemia, unspecified: Secondary | ICD-10-CM

## 2020-02-20 DIAGNOSIS — R7303 Prediabetes: Secondary | ICD-10-CM

## 2020-02-20 DIAGNOSIS — R739 Hyperglycemia, unspecified: Secondary | ICD-10-CM

## 2020-02-20 DIAGNOSIS — I10 Essential (primary) hypertension: Secondary | ICD-10-CM | POA: Diagnosis not present

## 2020-02-20 DIAGNOSIS — E782 Mixed hyperlipidemia: Secondary | ICD-10-CM

## 2020-02-20 MED ORDER — METOPROLOL SUCCINATE ER 25 MG PO TB24: 25.0000 mg | ORAL_TABLET | Freq: Every day | ORAL | 1 refills | Status: DC

## 2020-02-20 MED ORDER — LOSARTAN POTASSIUM 100 MG PO TABS: 100.0000 mg | ORAL_TABLET | Freq: Every day | ORAL | 3 refills | Status: DC

## 2020-02-20 MED ORDER — SIMVASTATIN 20 MG PO TABS: 20.0000 mg | ORAL_TABLET | Freq: Every day | ORAL | 3 refills | Status: DC

## 2020-02-20 NOTE — Assessment & Plan Note (Signed)
Update PSA levels. °

## 2020-02-20 NOTE — Patient Instructions (Addendum)
Continue current medications Have labs completed See me again in about 6 months.

## 2020-02-20 NOTE — Assessment & Plan Note (Signed)
He is tolerating simvastatin well.  Updated lipid panel ordered.

## 2020-02-20 NOTE — Assessment & Plan Note (Signed)
Update a1c

## 2020-02-20 NOTE — Progress Notes (Signed)
Nicholas Murillo - 74 y.o. male MRN [id number]  Date of birth: September 21, 1945  Subjective Chief Complaint  Patient presents with  . Hypertension    HPI Nicholas Murillo is a 74 y.o. male here today for follow up visit.  He has a history of HTN, HLD, BPH and prediabetes.  He states he is doing well today.   He has discontinued miralax, bowels are doing pretty good at this point.   BP has remained well controlled with losartan and metoprolol.  He denies side effects.  He has not had chest pain, shortness of breath, palpitations, headache or vision changes.   He is tolerating simvastatin well.  He denies myalgias of abdominal pain.   ROS:  A comprehensive ROS was completed and negative except as noted per HPI  No Known Allergies  Past Medical History:  Diagnosis Date  . Aortic valve regurgitation 11/28/2018   Moderate echocardiogram May 2020  . Arthritis   . Esophageal stricture   . GERD (gastroesophageal reflux disease)   . Hiatal hernia   . Hyperlipidemia   . Hypertension     Past Surgical History:  Procedure Laterality Date  . APPENDECTOMY  1960  . arterial catheter  1997  . UPPER GASTROINTESTINAL ENDOSCOPY      Social History   Socioeconomic History  . Marital status: Significant Other    Spouse name: [name]  . Number of children: 2  . Years of education: 30  . Highest education level: Master's degree (e.g., MA, MS, MEng, MEd, MSW, MBA)  Occupational History  . Occupation: [employer]    Comment: retired  Tobacco Use  . Smoking status: Former Research scientist (life sciences)  . Smokeless tobacco: Never Used  Vaping Use  . Vaping Use: Never used  Substance and Sexual Activity  . Alcohol use: No    Alcohol/week: 0.0 standard drinks    Comment: quit 20 years ago  . Drug use: No    Comment: quit 1994 (cocaine,pot,"anything could get hands on")  . Sexual activity: Yes  Other Topics Concern  . Not on file  Social History Narrative    Works lifting and caring wood back and forth to his house and gets  his exercise that way. Drinks 2 cupsa of caffeine during the day.   Social Determinants of Health   Financial Resource Strain:   . Difficulty of Paying Living Expenses: Not on file  Food Insecurity:   . Worried About Charity fundraiser in the Last Year: Not on file  . Ran Out of Food in the Last Year: Not on file  Transportation Needs:   . Lack of Transportation (Medical): Not on file  . Lack of Transportation (Non-Medical): Not on file  Physical Activity:   . Days of Exercise per Week: Not on file  . Minutes of Exercise per Session: Not on file  Stress:   . Feeling of Stress : Not on file  Social Connections:   . Frequency of Communication with Friends and Family: Not on file  . Frequency of Social Gatherings with Friends and Family: Not on file  . Attends Religious Services: Not on file  . Active Member of Clubs or Organizations: Not on file  . Attends Archivist Meetings: Not on file  . Marital Status: Not on file    Family History  Problem Relation Age of Onset  . Skin cancer Father   . Colon polyps Mother        benign  . Dementia Mother   .  Colon cancer Neg Hx   . Stomach cancer Neg Hx   . Esophageal cancer Neg Hx   . Rectal cancer Neg Hx     Health Maintenance  Topic Date Due  . COVID-19 Vaccine (1) Never done  . Fecal DNA (Cologuard)  10/27/2018  . INFLUENZA VACCINE  02/02/2020  . TETANUS/TDAP  02/08/2023  . Hepatitis C Screening  Completed  . PNA vac Low Risk Adult  Completed     ----------------------------------------------------------------------------------------------------------------------------------------------------------------------------------------------------------------- Physical Exam BP (!) 115/56 (BP Location: Left Arm, Patient Position: Sitting, Cuff Size: Normal)   Pulse 85   Temp 98.1 F (36.7 C) (Temporal)   Ht 5' 10.08" (1.78 m)   Wt 166 lb (75.3 kg)   SpO2 96%   BMI 23.76 kg/m   Physical Exam Constitutional:       Appearance: Normal appearance.  HENT:     Head: Normocephalic and atraumatic.  Eyes:     General: No scleral icterus. Cardiovascular:     Rate and Rhythm: Normal rate and regular rhythm.  Pulmonary:     Effort: Pulmonary effort is normal.     Breath sounds: Normal breath sounds.  Musculoskeletal:     Cervical back: Neck supple.  Neurological:     General: No focal deficit present.     Mental Status: He is alert.  Psychiatric:        Mood and Affect: Mood normal.        Behavior: Behavior normal.     ------------------------------------------------------------------------------------------------------------------------------------------------------------------------------------------------------------------- Assessment and Plan  Essential hypertension, benign Blood pressure is at goal at for age and co-morbidities.  I recommend continuation of current medications.  In addition they were instructed to follow a low sodium diet with regular exercise to help to maintain adequate control of blood pressure.    Elevated PSA Update PSA levels.   Hyperlipidemia He is tolerating simvastatin well.  Updated lipid panel ordered.   Prediabetes Update a1c   Meds ordered this encounter  Medications  . losartan (COZAAR) 100 MG tablet    Sig: Take 1 tablet (100 mg total) by mouth daily.    Dispense:  90 tablet    Refill:  3    Replaces lisinopril  . metoprolol succinate (TOPROL-XL) 25 MG 24 hr tablet    Sig: Take 1 tablet (25 mg total) by mouth daily.    Dispense:  90 tablet    Refill:  1  . simvastatin (ZOCOR) 20 MG tablet    Sig: Take 1 tablet (20 mg total) by mouth daily.    Dispense:  90 tablet    Refill:  3    Return in about 6 months (around 08/22/2020) for HTN.    This visit occurred during the SARS-CoV-2 public health emergency.  Safety protocols were in place, including screening questions prior to the visit, additional usage of staff PPE, and extensive cleaning of  exam room while observing appropriate contact time as indicated for disinfecting solutions.

## 2020-02-20 NOTE — Assessment & Plan Note (Signed)
Blood pressure is at goal at for age and co-morbidities.  I recommend continuation of current medications.  In addition they were instructed to follow a low sodium diet with regular exercise to help to maintain adequate control of blood pressure.  ? ?

## 2020-02-24 DIAGNOSIS — R972 Elevated prostate specific antigen [PSA]: Secondary | ICD-10-CM | POA: Diagnosis not present

## 2020-02-24 DIAGNOSIS — E782 Mixed hyperlipidemia: Secondary | ICD-10-CM | POA: Diagnosis not present

## 2020-02-24 DIAGNOSIS — I10 Essential (primary) hypertension: Secondary | ICD-10-CM | POA: Diagnosis not present

## 2020-02-24 DIAGNOSIS — R7303 Prediabetes: Secondary | ICD-10-CM | POA: Diagnosis not present

## 2020-02-25 LAB — COMPLETE METABOLIC PANEL WITH GFR
AG Ratio: 1.8 (calc) (ref 1.0–2.5)
ALT: 15 U/L (ref 9–46)
AST: 17 U/L (ref 10–35)
Albumin: 3.9 g/dL (ref 3.6–5.1)
Alkaline phosphatase (APISO): 69 U/L (ref 35–144)
BUN/Creatinine Ratio: 31 (calc) — ABNORMAL HIGH (ref 6–22)
BUN: 27 mg/dL — ABNORMAL HIGH (ref 7–25)
CO2: 25 mmol/L (ref 20–32)
Calcium: 9 mg/dL (ref 8.6–10.3)
Chloride: 109 mmol/L (ref 98–110)
Creat: 0.87 mg/dL (ref 0.70–1.18)
GFR, Est African American: 99 mL/min/{1.73_m2} (ref >=60)
GFR, Est Non African American: 85 mL/min/{1.73_m2} (ref >=60)
Globulin: 2.2 g/dL (calc) (ref 1.9–3.7)
Glucose, Bld: 116 mg/dL (ref 65–139)
Potassium: 4.3 mmol/L (ref 3.5–5.3)
Sodium: 140 mmol/L (ref 135–146)
Total Bilirubin: 0.8 mg/dL (ref 0.2–1.2)
Total Protein: 6.1 g/dL (ref 6.1–8.1)

## 2020-02-25 LAB — HEMOGLOBIN A1C
Hgb A1c MFr Bld: 5.7 % of total Hgb — ABNORMAL HIGH (ref <5.7)
Mean Plasma Glucose: 117 (calc)
eAG (mmol/L): 6.5 (calc)

## 2020-02-25 LAB — CBC
HCT: 45.5 % (ref 38.5–50.0)
Hemoglobin: 15.5 g/dL (ref 13.2–17.1)
MCH: 32.5 pg (ref 27.0–33.0)
MCHC: 34.1 g/dL (ref 32.0–36.0)
MCV: 95.4 fL (ref 80.0–100.0)
MPV: 10.5 fL (ref 7.5–12.5)
Platelets: 145 10*3/uL (ref 140–400)
RBC: 4.77 10*6/uL (ref 4.20–5.80)
RDW: 12 % (ref 11.0–15.0)
WBC: 5.9 10*3/uL (ref 3.8–10.8)

## 2020-02-25 LAB — LIPID PANEL
Cholesterol: 142 mg/dL (ref <200)
HDL: 41 mg/dL (ref >=40)
LDL Cholesterol (Calc): 79 mg/dL (calc)
Non-HDL Cholesterol (Calc): 101 mg/dL (calc) (ref <130)
Total CHOL/HDL Ratio: 3.5 (calc) (ref <5.0)
Triglycerides: 121 mg/dL (ref <150)

## 2020-02-25 LAB — PSA: PSA: 4.4 ng/mL — ABNORMAL HIGH (ref <=4.0)

## 2020-03-29 DIAGNOSIS — Z23 Encounter for immunization: Secondary | ICD-10-CM | POA: Diagnosis not present

## 2020-03-31 ENCOUNTER — Encounter: Payer: Self-pay | Admitting: *Deleted

## 2020-03-31 ENCOUNTER — Other Ambulatory Visit: Payer: Self-pay | Admitting: *Deleted

## 2020-03-31 DIAGNOSIS — I712 Thoracic aortic aneurysm, without rupture, unspecified: Secondary | ICD-10-CM

## 2020-04-06 ENCOUNTER — Telehealth: Payer: Self-pay | Admitting: Cardiology

## 2020-04-06 ENCOUNTER — Other Ambulatory Visit: Payer: Self-pay | Admitting: *Deleted

## 2020-04-06 DIAGNOSIS — I712 Thoracic aortic aneurysm, without rupture, unspecified: Secondary | ICD-10-CM

## 2020-04-06 NOTE — Telephone Encounter (Signed)
Left message for patient to call to discuss scheduling CTA chest/aorta and BMET ordered by Dr. Stanford Breed

## 2020-04-08 NOTE — Telephone Encounter (Signed)
Spoke with patient regarding appointment for CTA chest/aorta scheduled Monday 04/27/20 at 10:00 am at [facility] time is 9:45 am---patient may have a light breakfast.  Patient aware to get lab work on Wednesday 04/22/20, or Thursday 04/23/20.  Will mail information and lab order to patient

## 2020-04-22 DIAGNOSIS — I712 Thoracic aortic aneurysm, without rupture: Secondary | ICD-10-CM | POA: Diagnosis not present

## 2020-04-23 LAB — BASIC METABOLIC PANEL
BUN/Creatinine Ratio: 23 (ref 10–24)
BUN: 23 mg/dL (ref 8–27)
CO2: 24 mmol/L (ref 20–29)
Calcium: 9.8 mg/dL (ref 8.6–10.2)
Chloride: 104 mmol/L (ref 96–106)
Creatinine, Ser: 0.99 mg/dL (ref 0.76–1.27)
GFR calc Af Amer: 86 mL/min/{1.73_m2} (ref >59)
GFR calc non Af Amer: 75 mL/min/{1.73_m2} (ref >59)
Glucose: 100 mg/dL — ABNORMAL HIGH (ref 65–99)
Potassium: 4.7 mmol/L (ref 3.5–5.2)
Sodium: 141 mmol/L (ref 134–144)

## 2020-04-27 ENCOUNTER — Other Ambulatory Visit: Payer: Self-pay

## 2020-04-27 ENCOUNTER — Ambulatory Visit (INDEPENDENT_AMBULATORY_CARE_PROVIDER_SITE_OTHER): Payer: Medicare Other

## 2020-04-27 DIAGNOSIS — I517 Cardiomegaly: Secondary | ICD-10-CM | POA: Diagnosis not present

## 2020-04-27 DIAGNOSIS — I712 Thoracic aortic aneurysm, without rupture, unspecified: Secondary | ICD-10-CM

## 2020-04-27 DIAGNOSIS — J9811 Atelectasis: Secondary | ICD-10-CM | POA: Diagnosis not present

## 2020-04-27 DIAGNOSIS — K449 Diaphragmatic hernia without obstruction or gangrene: Secondary | ICD-10-CM | POA: Diagnosis not present

## 2020-04-27 DIAGNOSIS — R911 Solitary pulmonary nodule: Secondary | ICD-10-CM | POA: Diagnosis not present

## 2020-04-27 IMAGING — CT CT ANGIO CHEST
2 of 6 series · 18 of 46 positions shown · IV contrast (APPLIED)
Comparison: [DATE]

CLINICAL DATA: Thoracic aortic aneurysm

EXAM:
CT ANGIOGRAPHY CHEST WITH CONTRAST
TECHNIQUE: Multidetector CT imaging of the chest was performed using the
standard protocol during bolus administration of intravenous
contrast. Multiplanar CT image reconstructions and MIPs were
obtained to evaluate the vascular anatomy.
CONTRAST:  100mL OMNIPAQUE IOHEXOL 350 MG/ML SOLN

[Series 4: axial arterial · axial · arterial · 0.66mm/px · z∈[-340,-40]mm · 15 of 112 slices shown]
[im 6/112  lung]
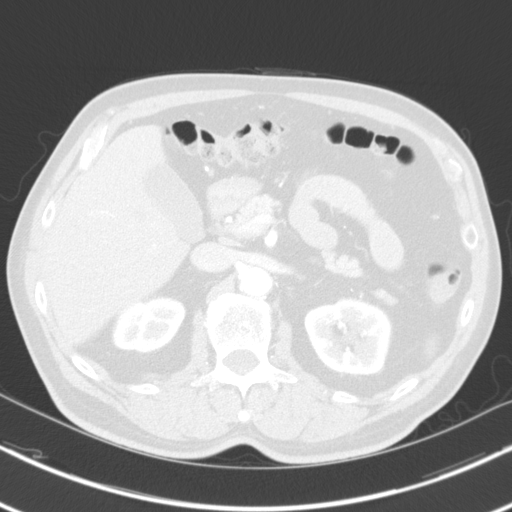
[im 12/112  soft-tissue]
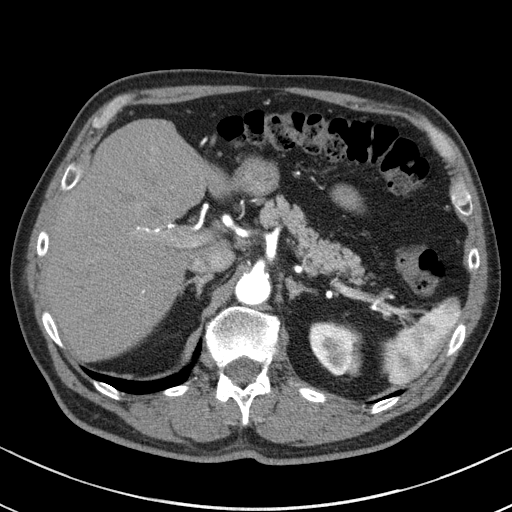
[im 24/112  lung]
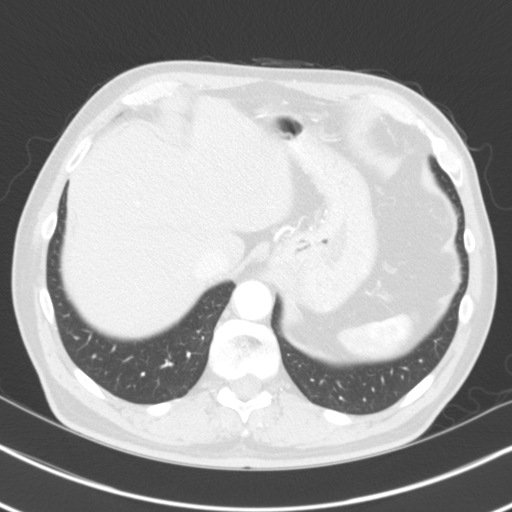
[im 30/112  soft-tissue]
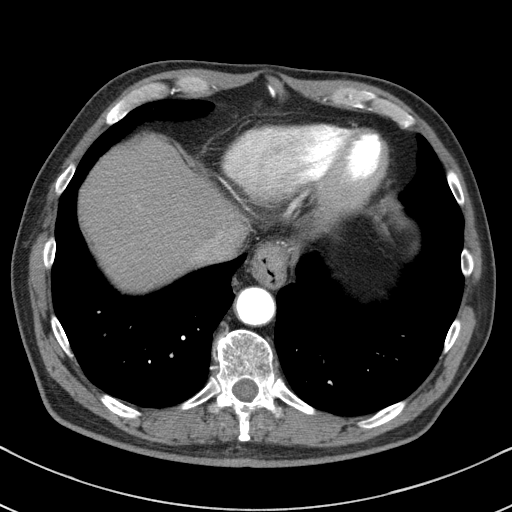
[im 36/112  lung]
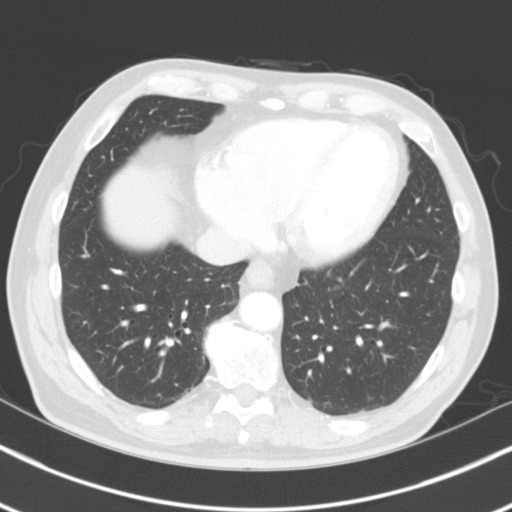
[im 41/112  soft-tissue]
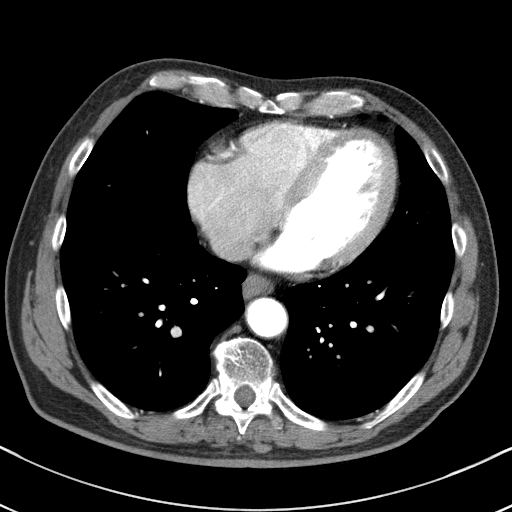
[im 47/112  lung]
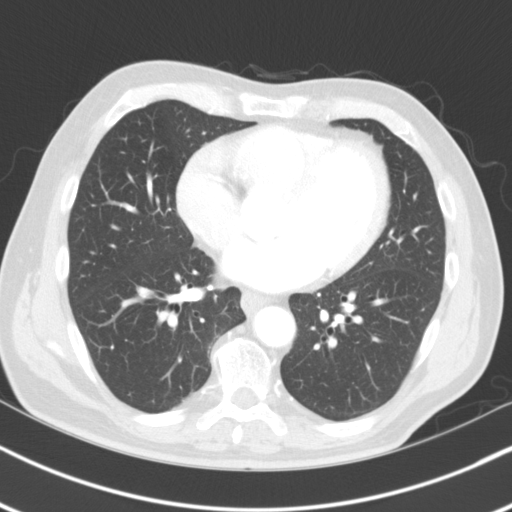
[im 59/112  soft-tissue]
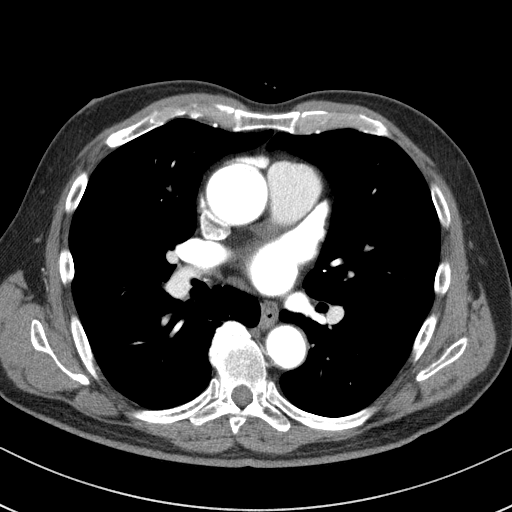
[im 65/112  lung]
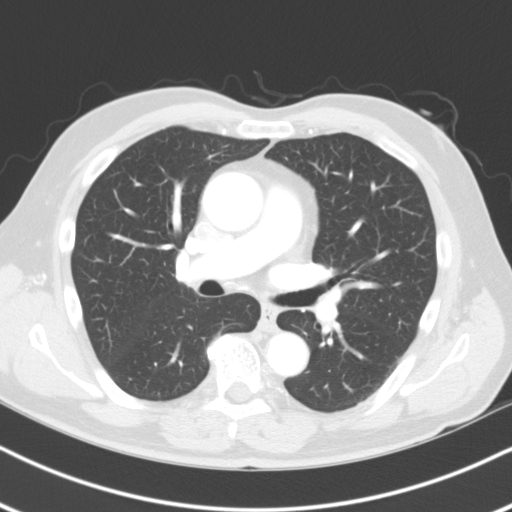
[im 71/112  soft-tissue]
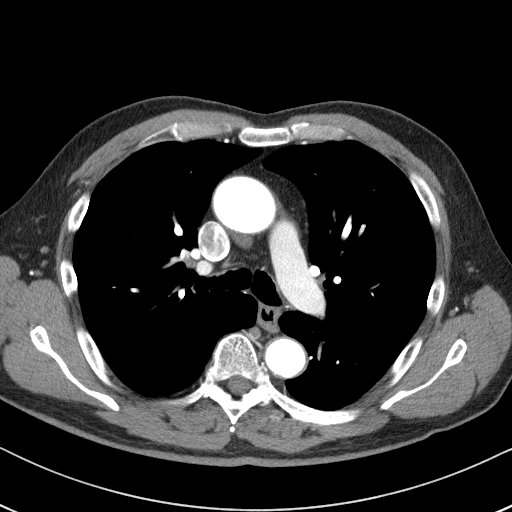
[im 76/112  lung]
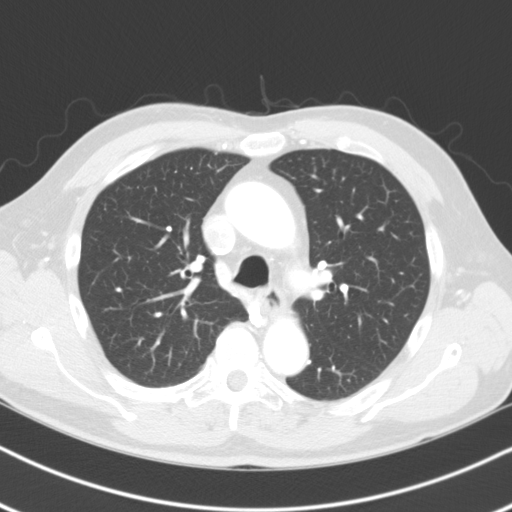
[im 82/112  soft-tissue]
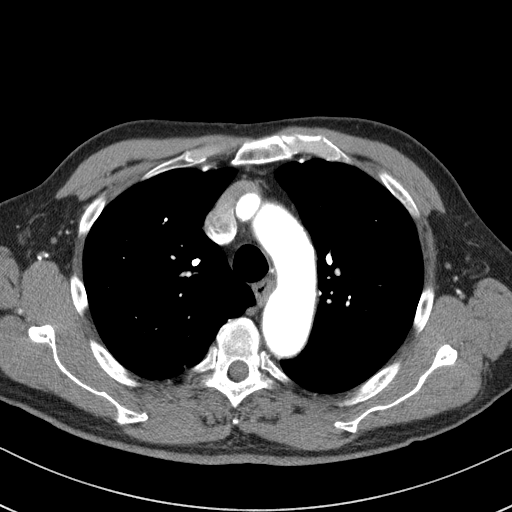
[im 94/112  lung]
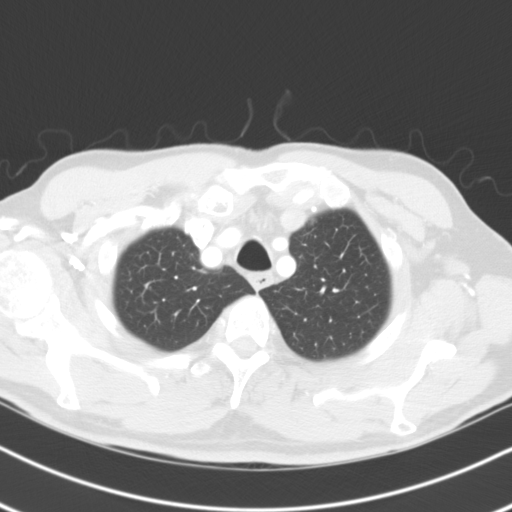
[im 100/112  soft-tissue]
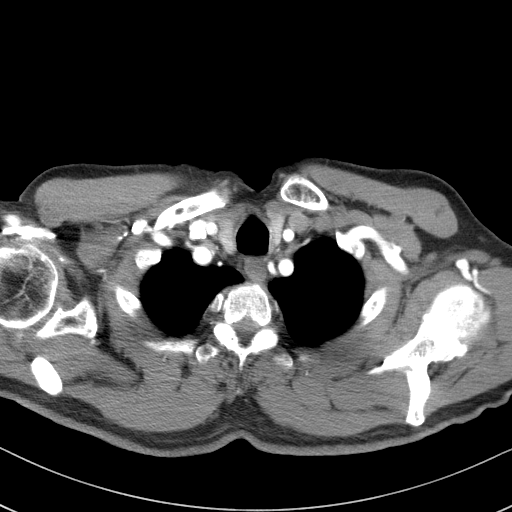
[im 106/112  lung]
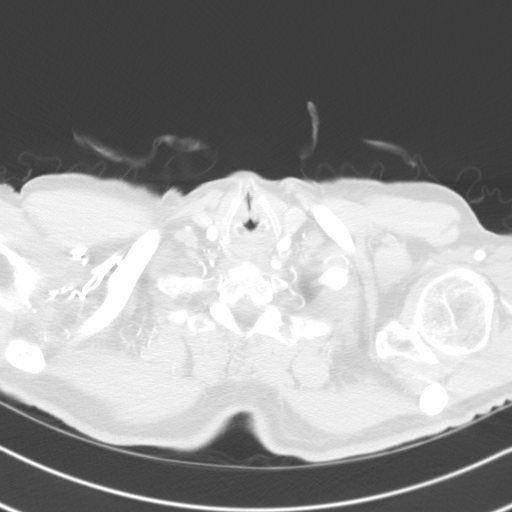

[Series 7: coronal · coronal · 0.67mm/px · 3 of 86 slices shown]
[im 22/86  soft-tissue]
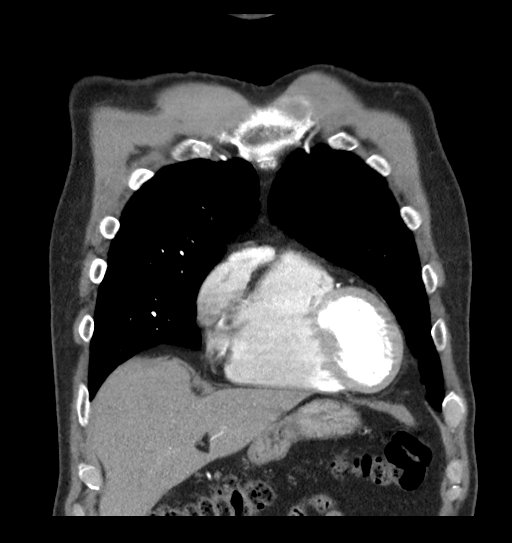
[im 43/86  soft-tissue]
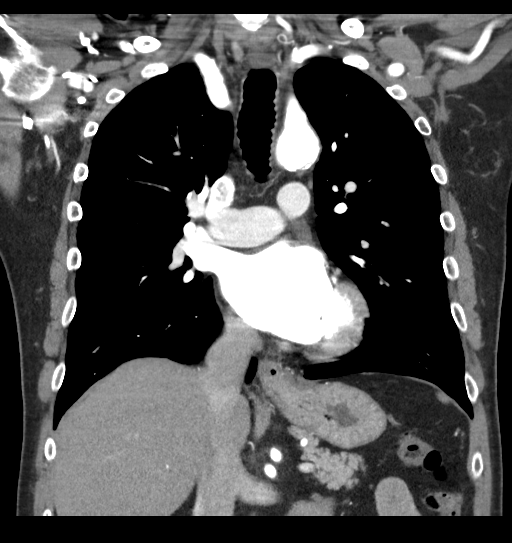
[im 64/86  soft-tissue]
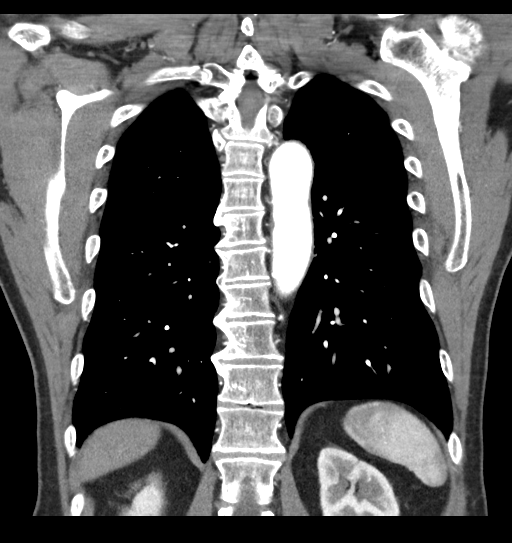

[18 of 46 positions shown; findings below may reference images not displayed]

FINDINGS: Cardiovascular: Minor atherosclerotic change of the thoracic aorta.
Similar minor fusiform aneurysmal dilatation of the ascending
thoracic aorta, maximal diameter remains 4 cm. Patent 3 vessel arch
anatomy. No acute dissection, mediastinal hemorrhage or hematoma.
Native coronary atherosclerosis. Borderline cardiomegaly. No
pericardial effusion.

Patent central pulmonary arteries.

Mixing artifact noted in the SVC. No central venous occlusive
process.

Mediastinum/Nodes: Similar small hiatal hernia. Esophagus
nondilated. Trachea and central airways are patent. No adenopathy.
Thyroid unremarkable.

Lungs/Pleura: Posterior right upper lobe subpleural 5 mm nodule,
image [DATE].

Similar minor basilar atelectasis. No acute airspace process,
collapse or consolidation. Negative for edema or interstitial
disease. No pleural abnormality, effusion or pneumothorax. Central
airways are patent.

Upper Abdomen: Stable 1 cm oval hypodense lesion in the pancreas
tail. No associated distal pancreas atrophy or ductal dilatation. No
surrounding inflammatory process.

No acute upper abdominal finding. Minor colonic diverticulosis in
the splenic flexure.

Musculoskeletal: Degenerative changes noted of the spine.

Review of the MIP images confirms the above findings.
IMPRESSION: Stable mild fusiform aneurysmal dilatation of the ascending thoracic
aorta, maximal diameter remains 4 cm.

Recommend annual imaging followup by CTA or MRA. This recommendation
follows [5G] ACCF/AHA/AATS/ACR/ASA/SCA/RTOYOTA/RTOYOTA/RTOYOTA/RTOYOTA Guidelines
for the Diagnosis and Management of Patients with Thoracic Aortic
Disease. Circulation. [5G]; 121: [id number]. Aortic aneurysm NOS
([5G]-[5G])

5 mm posterior subpleural right upper lobe nodule.

No follow-up needed if patient is low-risk. Non-contrast chest CT
can be considered in 12 months if patient is high-risk. This
recommendation follows the consensus statement: Guidelines for
Management of Incidental Pulmonary Nodules Detected on CT Images:

Stable oval 1 cm hypodense cystic lesion in the pancreas tail.
Please refer to previous MRI report and recommendation from
[DATE].

No other acute intrathoracic finding.

Native coronary atherosclerosis

Aortic Atherosclerosis ([5G]-[5G]).

## 2020-04-27 MED ORDER — IOHEXOL 350 MG/ML SOLN
100.0000 mL | Freq: Once | INTRAVENOUS | Status: AC | PRN
  Administered 2020-04-27: 100 mL via INTRAVENOUS

## 2020-05-01 NOTE — Progress Notes (Signed)
HPI: FU palpitations. 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 Echocardiogram June 2021 showed ejection fraction 50 to 69%, grade 1 diastolic dysfunction, mild left atrial enlargement, mild mitral regurgitation, mild to moderate aortic insufficiency and mildly dilated ascending aorta at 43 mm. CTA October 2021 showed mildly dilated ascending thoracic aorta measuring 4 cm, 5 mm right upper lobe nodule and stable hypodense cystic lesion in the pancreatic tail.  Since last seen patient denies dyspnea, chest pain, palpitations or syncope.  Current Outpatient Medications  Medication Sig Dispense Refill  . aspirin EC 81 MG tablet Take 81 mg by mouth daily.    Marland Kitchen EPINEPHrine (EPIPEN 2-PAK) 0.3 mg/0.3 mL IJ SOAJ injection Inject 0.3 mLs (0.3 mg total) into the muscle once. 1 Device 0  . losartan (COZAAR) 100 MG tablet Take 1 tablet (100 mg total) by mouth daily. 90 tablet 3  . magnesium oxide (MAG-OX) 400 MG tablet Take 800 mg by mouth daily.    . metoprolol succinate (TOPROL-XL) 25 MG 24 hr tablet Take 1 tablet (25 mg total) by mouth daily. 90 tablet 1  . polyethylene glycol (MIRALAX) packet Take 17 g by mouth daily. (Patient taking differently: Take 17 g by mouth daily as needed. ) 14 each 0  . simvastatin (ZOCOR) 20 MG tablet Take 1 tablet (20 mg total) by mouth daily. 90 tablet 3   No current facility-administered medications for this visit.     Past Medical History:  Diagnosis Date  . Aortic valve regurgitation 11/28/2018   Moderate echocardiogram May 2020  . Arthritis   . Esophageal stricture   . GERD (gastroesophageal reflux disease)   . Hiatal hernia   . Hyperlipidemia   . Hypertension     Past Surgical History:  Procedure Laterality Date  . APPENDECTOMY  1960  . arterial catheter  1997  . UPPER GASTROINTESTINAL ENDOSCOPY      Social History   Socioeconomic  History  . Marital status: Significant Other    Spouse name: [name]  . Number of children: 2  . Years of education: 25  . Highest education level: Master's degree (e.g., MA, MS, MEng, MEd, MSW, MBA)  Occupational History  . Occupation: [employer]    Comment: retired  Tobacco Use  . Smoking status: Former Research scientist (life sciences)  . Smokeless tobacco: Never Used  Vaping Use  . Vaping Use: Never used  Substance and Sexual Activity  . Alcohol use: No    Alcohol/week: 0.0 standard drinks    Comment: quit 20 years ago  . Drug use: No    Comment: quit 1994 (cocaine,pot,"anything could get hands on")  . Sexual activity: Yes  Other Topics Concern  . 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 Drinks 2 cupsa of caffeine during the day.   Social Determinants of Health   Financial Resource Strain:   . Difficulty of Paying Living Expenses: Not on file  Food Insecurity:   . Worried About Charity fundraiser in the Last Year: Not on file  . Ran Out of Food in the Last Year: Not on file  Transportation Needs:   . Lack of Transportation (Medical): Not on file  . Lack of Transportation (Non-Medical): Not on file  Physical Activity:   . Days of Exercise per Week: Not on  file  . Minutes of Exercise per Session: Not on file  Stress:   . Feeling of Stress : Not on file  Social Connections:   . Frequency of Communication with Friends and Family: Not on file  . Frequency of Social Gatherings with Friends and Family: Not on file  . Attends Religious Services: Not on file  . Active Member of Clubs or Organizations: Not on file  . Attends Archivist Meetings: Not on file  . Marital Status: Not on file  Intimate Partner Violence:   . Fear of Current or Ex-Partner: Not on file  . Emotionally Abused: Not on file  . Physically Abused: Not on file  . Sexually Abused: Not on file    Family History  Problem Relation Age of  Onset  . Skin cancer Father   . Colon polyps Mother        benign  . Dementia Mother   . Colon cancer Neg Hx   . Stomach cancer Neg Hx   . Esophageal cancer Neg Hx   . Rectal cancer Neg Hx     ROS: no fevers or chills, productive cough, hemoptysis, dysphasia, odynophagia, melena, hematochezia, dysuria, hematuria, rash, seizure activity, orthopnea, PND, pedal edema, claudication. Remaining systems are negative.  Physical Exam: Well-developed well-nourished in no acute distress.  Skin is warm and dry.  HEENT is normal.  Neck is supple.  Chest is clear to auscultation with normal expansion.  Cardiovascular exam is regular rate and rhythm.  Abdominal exam nontender or distended. No masses palpated. Extremities show no edema. neuro grossly intact  ECG-sinus rhythm with occasional PAC, no ST changes.  Personally reviewed  A/P  1 aortic insufficiency-mild to moderate on most recent echo. We will plan follow-up echocardiogram June 2022.  2 dilated aortic root-patient will need follow-up CTA October 2022. This will also image previous pulmonary nodule.  3 cystic lesion in pancreas-patient will need follow-up MR November 2022.  4 hypertension-blood pressure controlled. Continue present medications and follow.  5 palpitations-continue beta-blocker.  6 hyperlipidemia-continue statin.  Kirk Ruths, MD

## 2020-05-06 ENCOUNTER — Other Ambulatory Visit: Payer: Self-pay

## 2020-05-06 ENCOUNTER — Ambulatory Visit (INDEPENDENT_AMBULATORY_CARE_PROVIDER_SITE_OTHER): Payer: Medicare Other | Admitting: Cardiology

## 2020-05-06 ENCOUNTER — Encounter: Payer: Self-pay | Admitting: Cardiology

## 2020-05-06 VITALS — BP 132/66 | HR 51 | Ht 70.5 in | Wt 170.1 lb

## 2020-05-06 DIAGNOSIS — I351 Nonrheumatic aortic (valve) insufficiency: Secondary | ICD-10-CM | POA: Diagnosis not present

## 2020-05-06 DIAGNOSIS — I10 Essential (primary) hypertension: Secondary | ICD-10-CM

## 2020-05-06 DIAGNOSIS — I712 Thoracic aortic aneurysm, without rupture, unspecified: Secondary | ICD-10-CM

## 2020-05-06 DIAGNOSIS — R002 Palpitations: Secondary | ICD-10-CM | POA: Diagnosis not present

## 2020-05-06 NOTE — Patient Instructions (Signed)

## 2020-05-20 ENCOUNTER — Ambulatory Visit (INDEPENDENT_AMBULATORY_CARE_PROVIDER_SITE_OTHER): Payer: Medicare Other

## 2020-05-20 ENCOUNTER — Ambulatory Visit (INDEPENDENT_AMBULATORY_CARE_PROVIDER_SITE_OTHER): Payer: Medicare Other | Admitting: Sports Medicine

## 2020-05-20 ENCOUNTER — Other Ambulatory Visit: Payer: Self-pay

## 2020-05-20 DIAGNOSIS — M11262 Other chondrocalcinosis, left knee: Secondary | ICD-10-CM | POA: Diagnosis not present

## 2020-05-20 DIAGNOSIS — M1712 Unilateral primary osteoarthritis, left knee: Secondary | ICD-10-CM | POA: Diagnosis not present

## 2020-05-20 DIAGNOSIS — M11261 Other chondrocalcinosis, right knee: Secondary | ICD-10-CM | POA: Diagnosis not present

## 2020-05-20 DIAGNOSIS — M25462 Effusion, left knee: Secondary | ICD-10-CM | POA: Diagnosis not present

## 2020-05-20 IMAGING — DX DG KNEE 1-2V*R*
2 series · 2 of 2 positions shown · non-contrast
Comparison: None.

CLINICAL DATA: Left knee pain.

EXAM:
LEFT KNEE - COMPLETE 4+ VIEW; RIGHT KNEE - 1-2 VIEW

[tunnel]
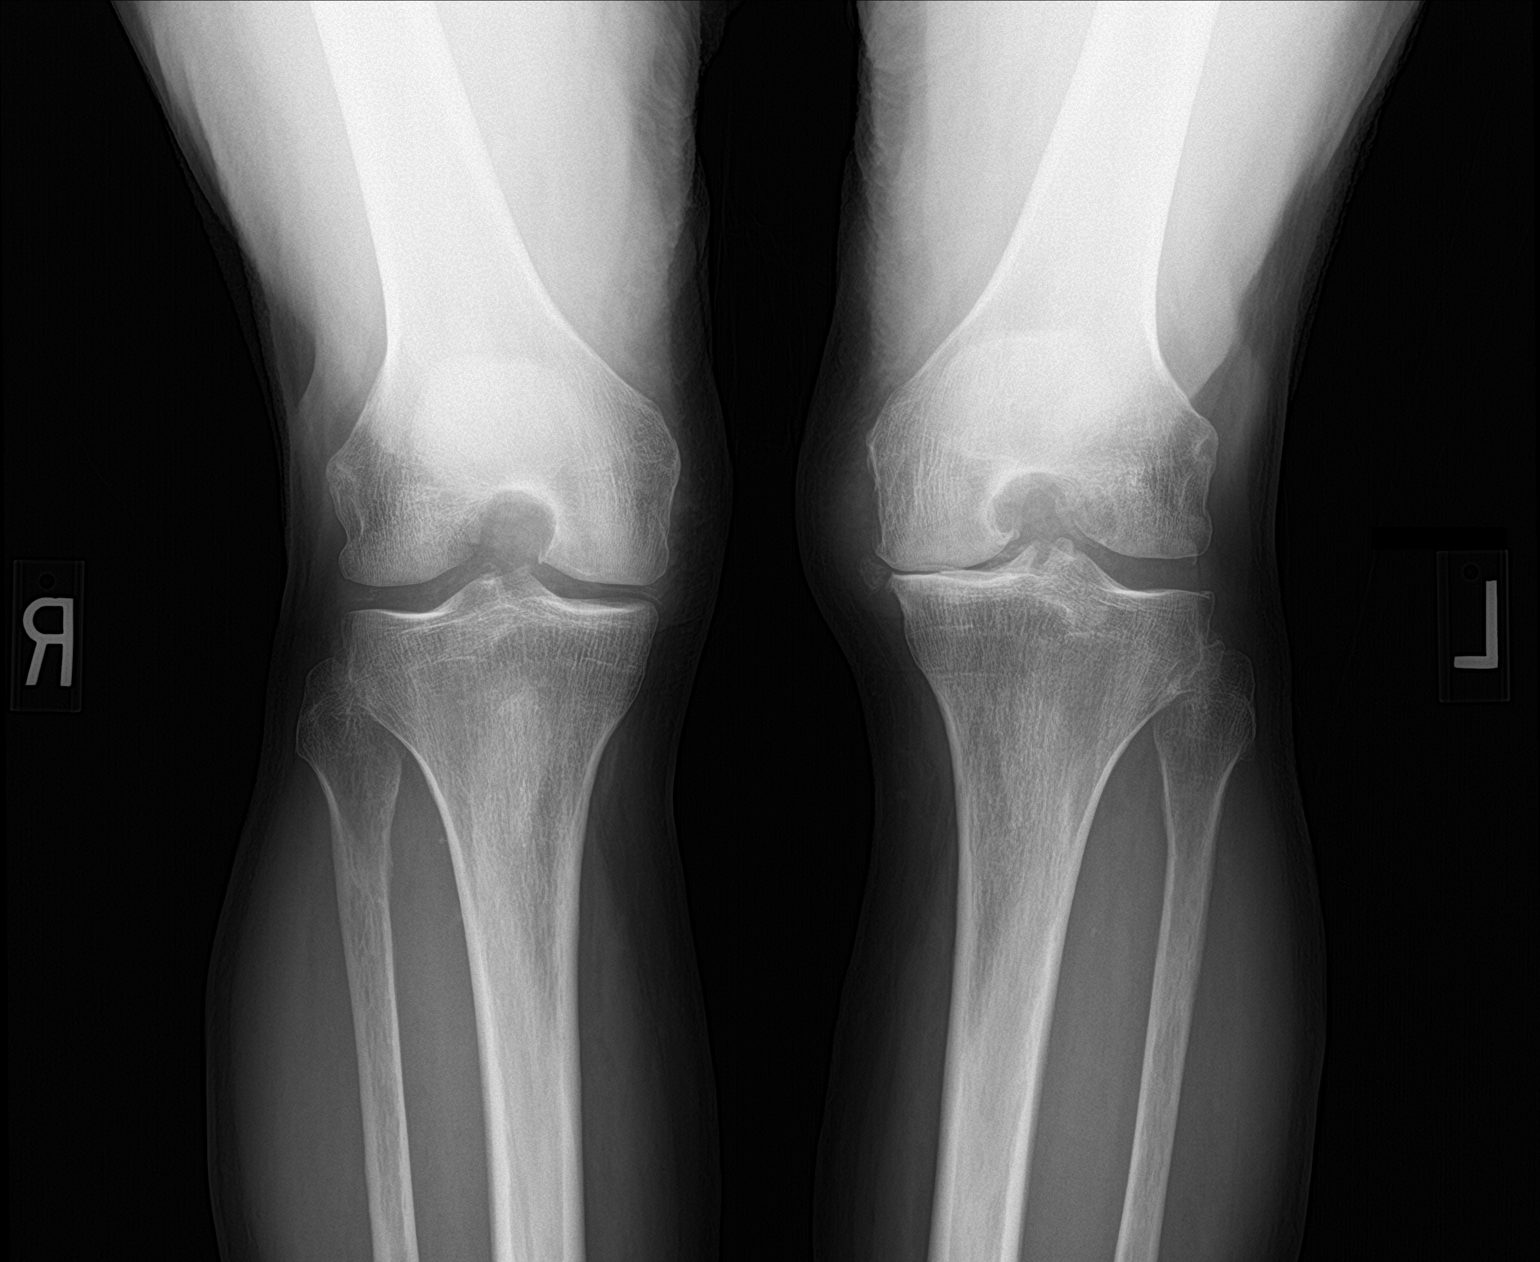

[knee ap bilat standing]
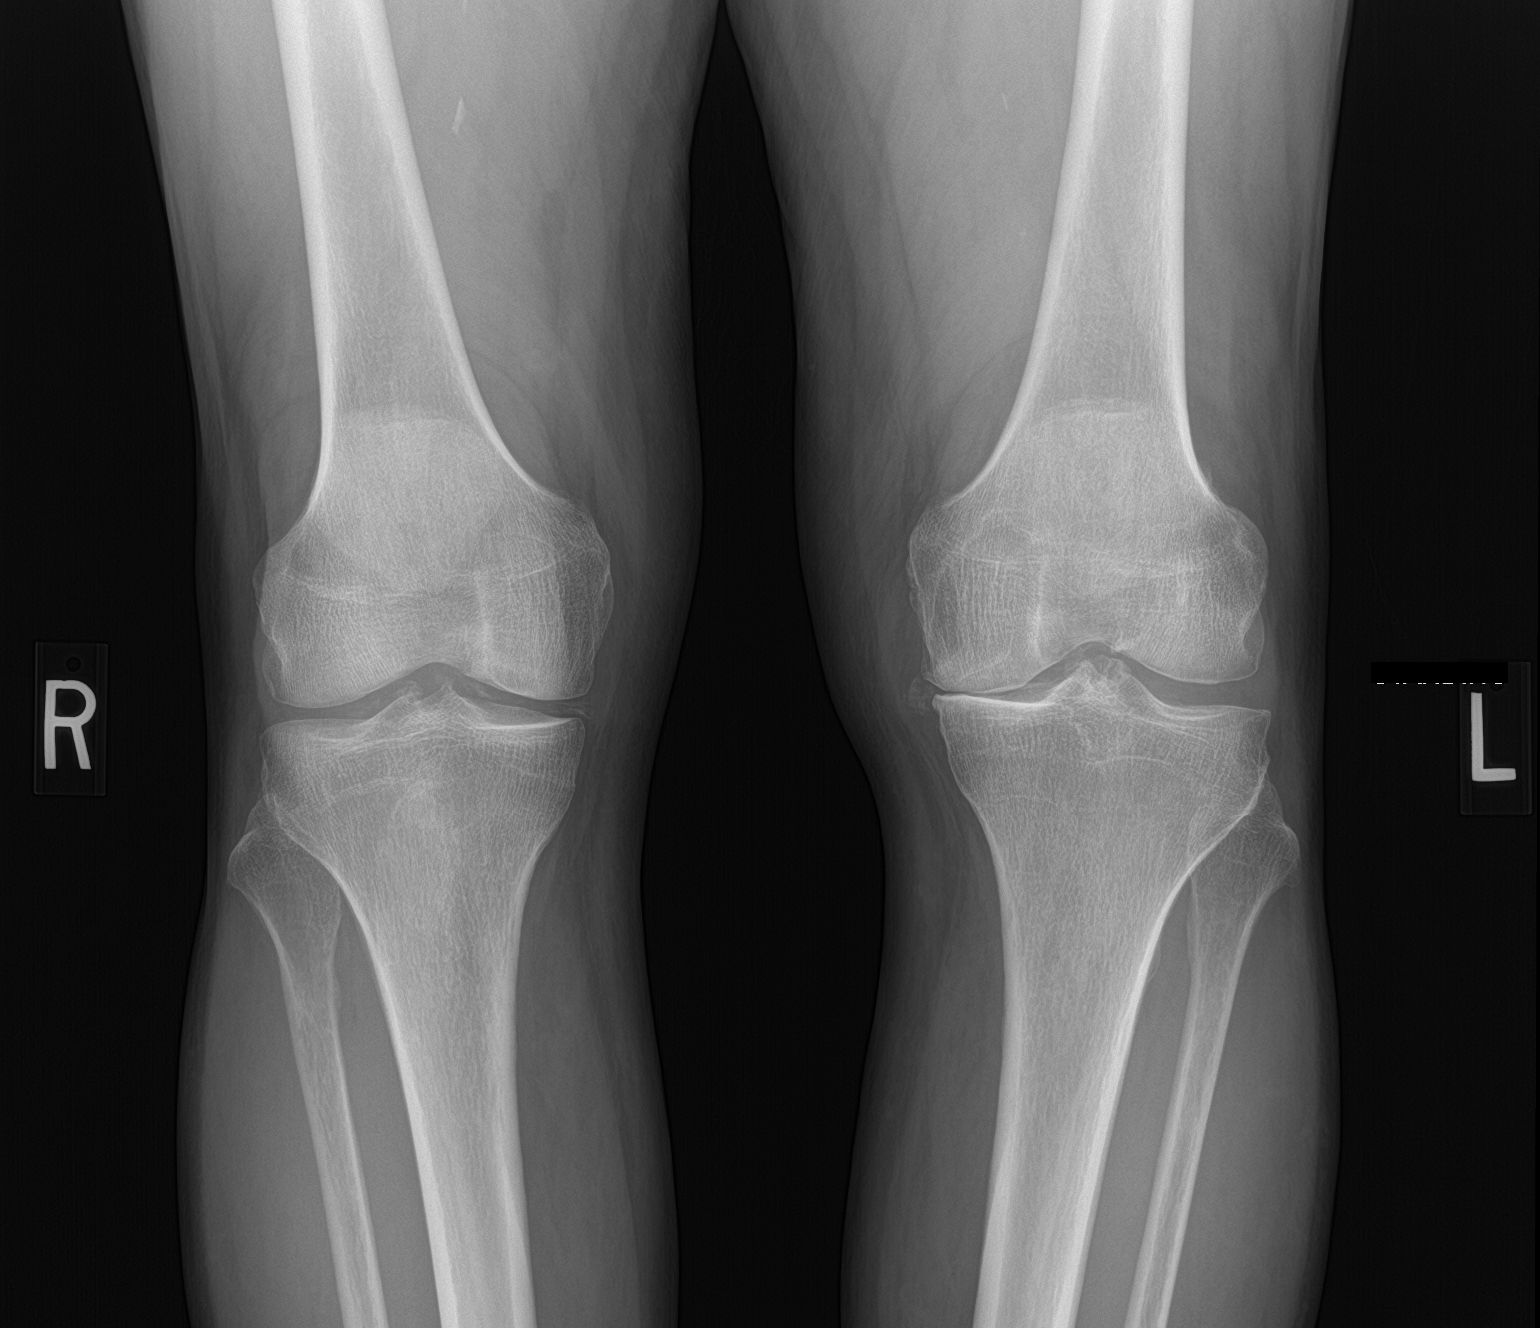

[2 of 2 positions shown; findings below may reference images not displayed]

FINDINGS: Left knee:

No evidence of acute fracture or dislocation. Chondrocalcinosis.
Moderate to severe medial compartment degenerative change. Mild
patellofemoral degenerative change. Vascular calcifications.

Right knee:

Limited evaluation with bilateral AP and AFRICHAL standing views
performed for comparison (per request). Within this limitation, no
evidence of acute fracture or malalignment of the right knee.
Chondrocalcinosis and medial compartment degenerative change.
IMPRESSION: 1. No evidence of acute fracture or dislocation.
2. Moderate to severe medial compartment degenerative change of the
left knee.
3. Chondrocalcinosis bilaterally.
4. Small left knee joint effusion.

## 2020-05-20 IMAGING — DX DG KNEE COMPLETE 4+V*L*
4 series · 4 of 4 positions shown · non-contrast
Comparison: None.

CLINICAL DATA: Left knee pain.

EXAM:
LEFT KNEE - COMPLETE 4+ VIEW; RIGHT KNEE - 1-2 VIEW

[tunnel]
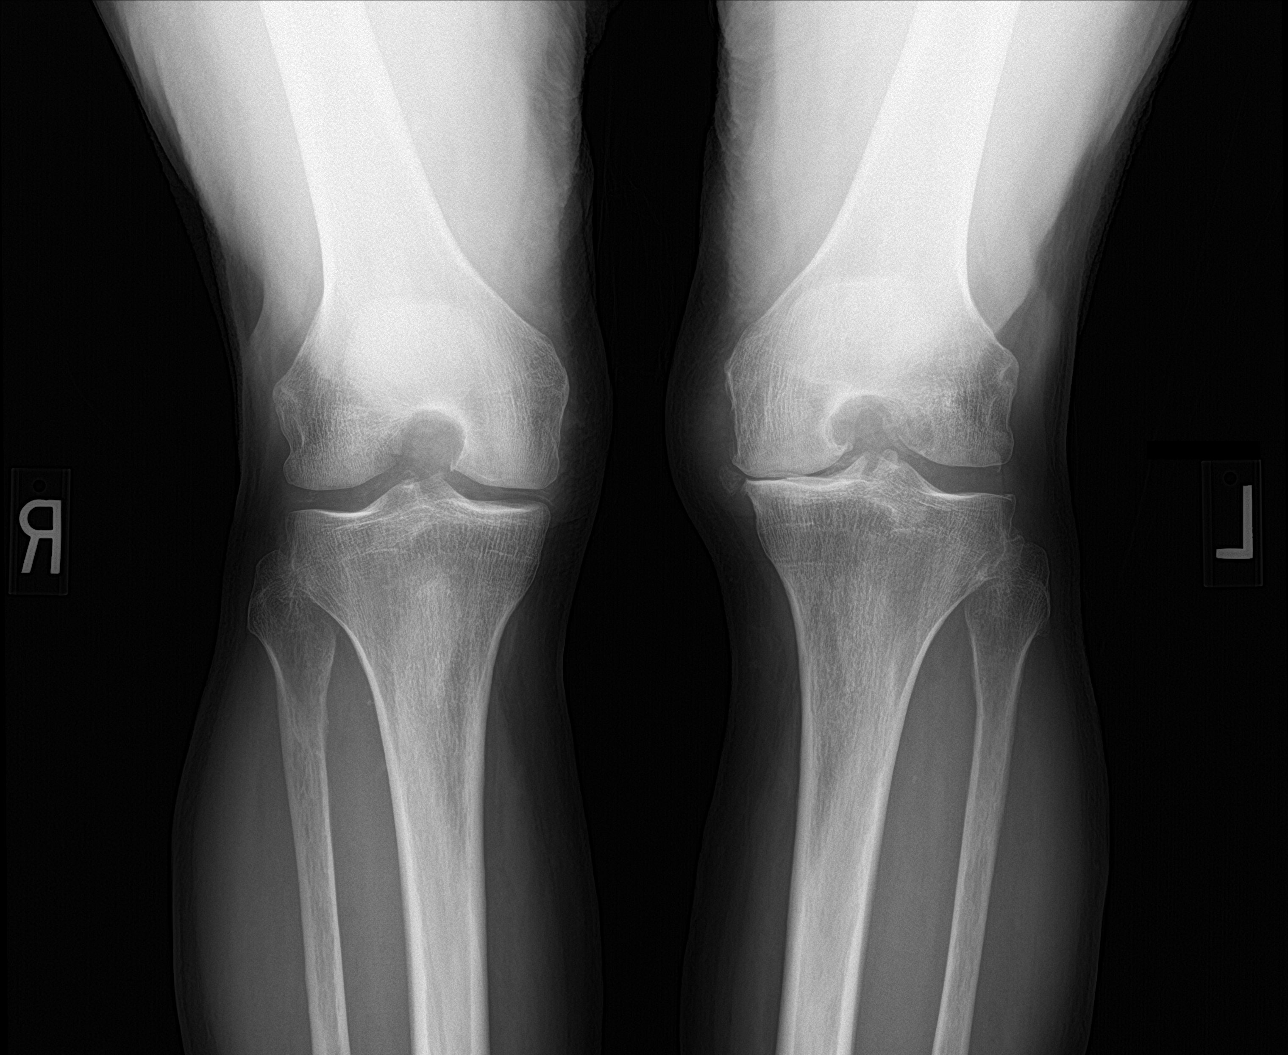

[knee lat]
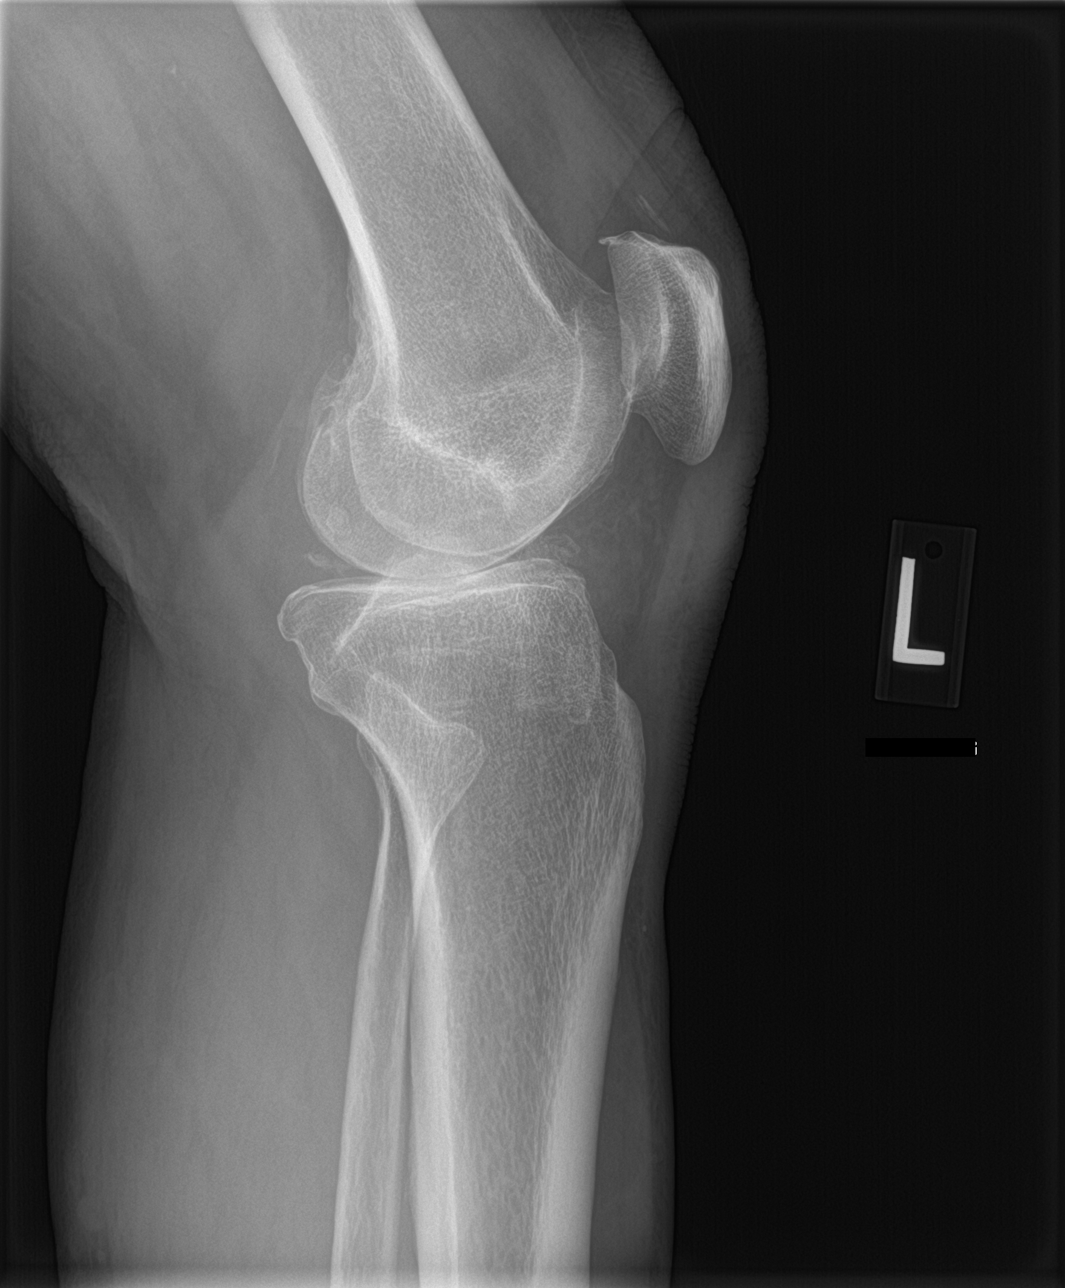

[knee sunrise]
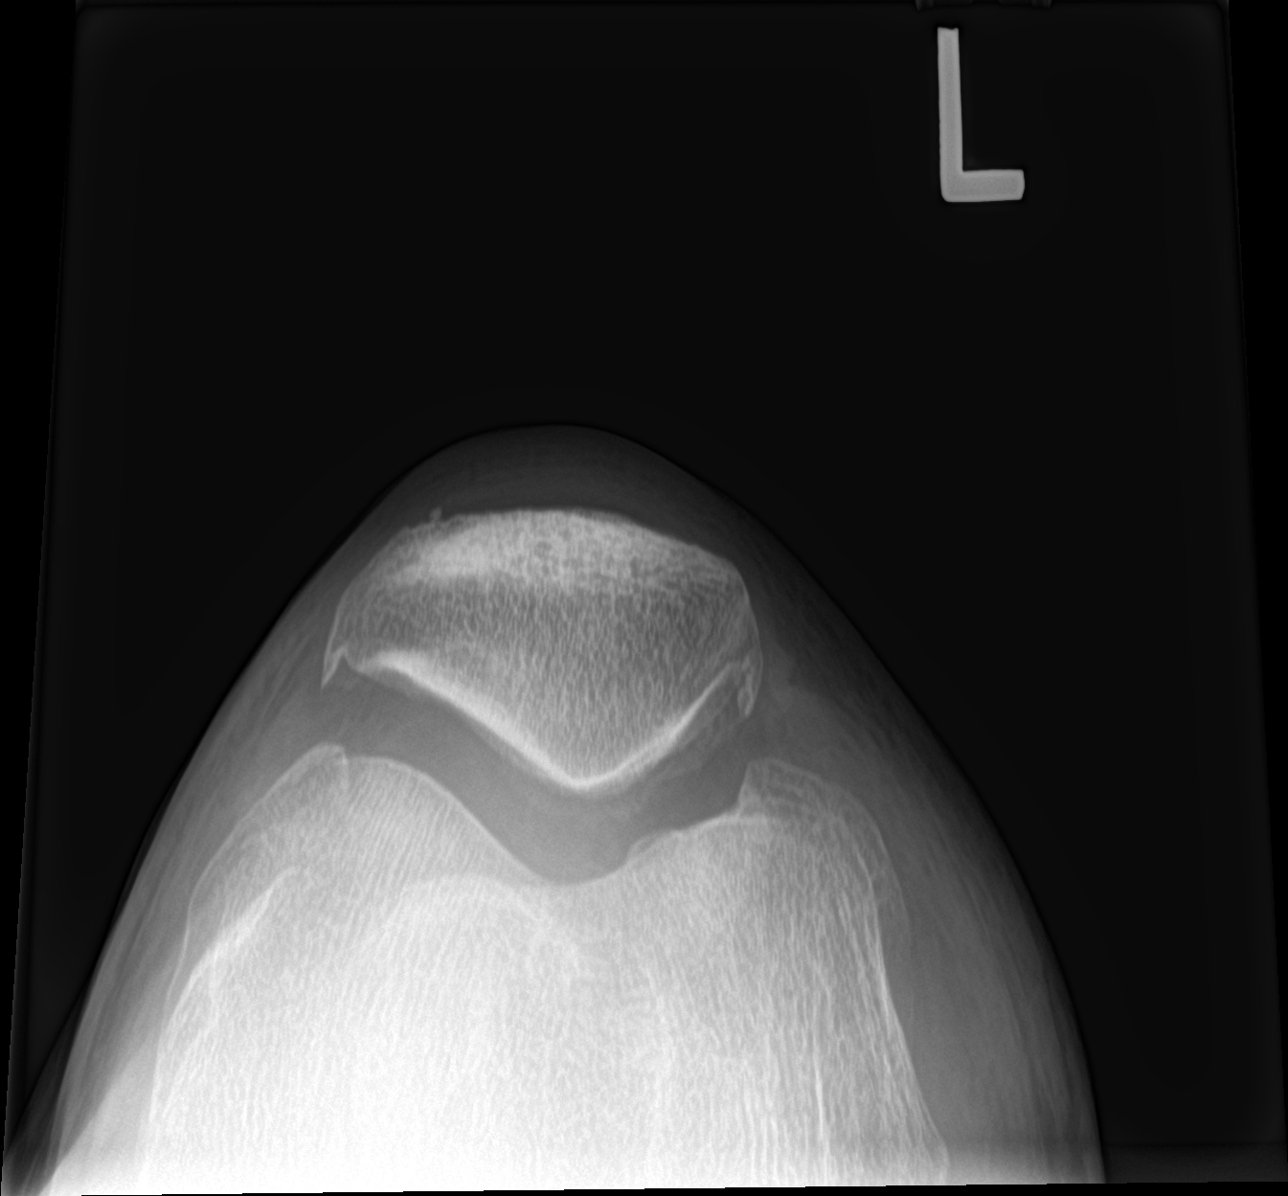

[knee ap bilat standing]
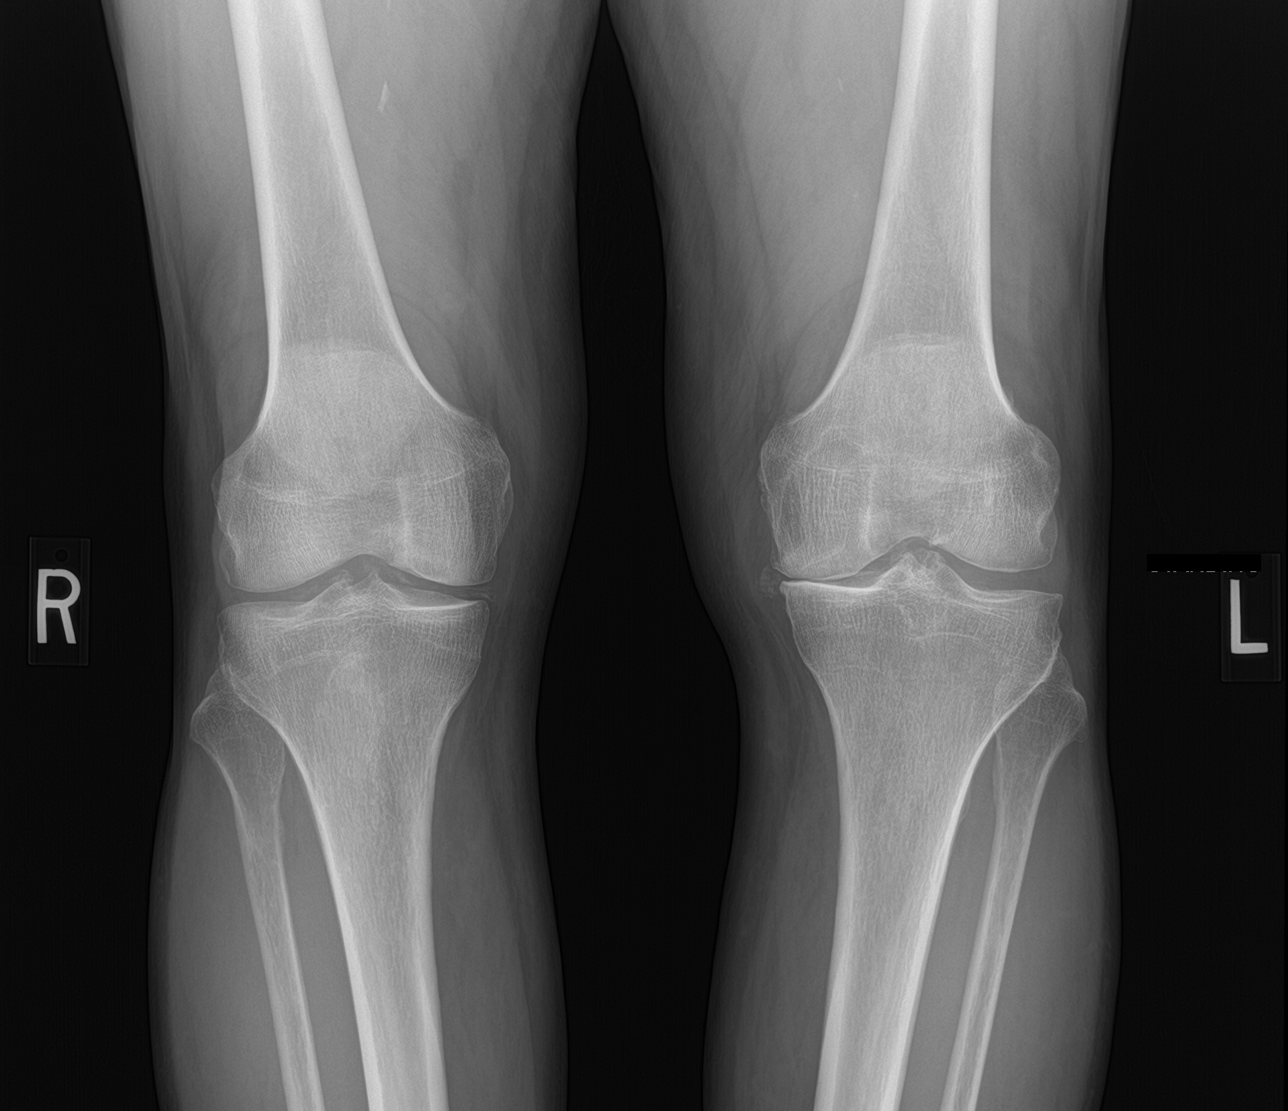

[4 of 4 positions shown; findings below may reference images not displayed]

FINDINGS: Left knee:

No evidence of acute fracture or dislocation. Chondrocalcinosis.
Moderate to severe medial compartment degenerative change. Mild
patellofemoral degenerative change. Vascular calcifications.

Right knee:

Limited evaluation with bilateral AP and AFRICHAL standing views
performed for comparison (per request). Within this limitation, no
evidence of acute fracture or malalignment of the right knee.
Chondrocalcinosis and medial compartment degenerative change.
IMPRESSION: 1. No evidence of acute fracture or dislocation.
2. Moderate to severe medial compartment degenerative change of the
left knee.
3. Chondrocalcinosis bilaterally.
4. Small left knee joint effusion.

## 2020-05-20 MED ORDER — DICLOFENAC SODIUM 2 % EX SOLN: 2.0000 | Freq: Two times a day (BID) | CUTANEOUS | 0 refills | Status: DC

## 2020-05-20 NOTE — Assessment & Plan Note (Addendum)
Nicholas Murillo is a very pleasant 74 year old male, he is a retired Engineer, agricultural. He has had a long history of pain in his left knee, anteriorly and medially with progressive genu varus. He also has tenderness at the medial joint line, and difficulty fully extending his knee, otherwise no mechanical symptoms. He does desire a nonpharmacologic approach which is acceptable, he will start with an anti-inflammatory predominately pescaterian diet, he can try some over-the-counter Voltaren (providing him with some Pennsaid samples to start out with), adding some home rehab exercises, x-rays, and referral for custom molded orthotics. Return to see me in 1 month and we can consider arthritis strength Tylenol if insufficient improvement.

## 2020-05-20 NOTE — Progress Notes (Signed)
    Procedures performed today:    None.  Independent interpretation of notes and tests performed by another provider:   None.  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 He also has tenderness at the medial joint line, and difficulty fully extending his knee, otherwise no mechanical symptoms. He does desire a nonpharmacologic approach which is acceptable, he will start with an anti-inflammatory predominately pescaterian diet, he can try some over-the-counter Voltaren (providing him with some Pennsaid samples to start out with), adding some home rehab exercises, x-rays, and referral for custom molded orthotics. Return to see me in 1 month and we can consider arthritis strength Tylenol if insufficient improvement.    ___________________________________________ Gwen Her. Dianah Field, M.D., ABFM., CAQSM. Primary Care and [place] Instructor of [place] of [facility] of Medicine

## 2020-05-25 ENCOUNTER — Other Ambulatory Visit: Payer: Self-pay

## 2020-05-25 ENCOUNTER — Encounter: Payer: Self-pay | Admitting: Family Medicine

## 2020-05-25 ENCOUNTER — Ambulatory Visit (INDEPENDENT_AMBULATORY_CARE_PROVIDER_SITE_OTHER): Payer: Medicare Other | Admitting: Family Medicine

## 2020-05-25 DIAGNOSIS — M1712 Unilateral primary osteoarthritis, left knee: Secondary | ICD-10-CM | POA: Diagnosis not present

## 2020-05-25 NOTE — Progress Notes (Signed)
Nicholas Murillo - 74 y.o. male MRN [id number]  Date of birth: Aug 03, 1945  SUBJECTIVE:  Including CC & ROS.  Chief Complaint  Patient presents with  . Foot Orthotics    Nicholas Murillo is a 74 y.o. male that is presenting with left knee pain.  He has tried tear with some improvement.  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   Review of Systems See HPI   HISTORY: Past Medical, Surgical, Social, and Family History Reviewed & Updated per EMR.   Pertinent Historical Findings include:  Past Medical History:  Diagnosis Date  . Aortic valve regurgitation 11/28/2018   Moderate echocardiogram May 2020  . Arthritis   . Esophageal stricture   . GERD (gastroesophageal reflux disease)   . Hiatal hernia   . Hyperlipidemia   . Hypertension     Past Surgical History:  Procedure Laterality Date  . APPENDECTOMY  1960  . arterial catheter  1997  . UPPER GASTROINTESTINAL ENDOSCOPY      Family History  Problem Relation Age of Onset  . Skin cancer Father   . Colon polyps Mother        benign  . Dementia Mother   . Colon cancer Neg Hx   . Stomach cancer Neg Hx   . Esophageal cancer Neg Hx   . Rectal cancer Neg Hx     Social History   Socioeconomic History  . Marital status: Significant Other    Spouse name: [name]  . Number of children: 2  . Years of education: 66  . Highest education level: Master's degree (e.g., MA, MS, MEng, MEd, MSW, MBA)  Occupational History  . Occupation: [employer]    Comment: retired  Tobacco Use  . Smoking status: Former Research scientist (life sciences)  . Smokeless tobacco: Never Used  Vaping Use  . Vaping Use: Never used  Substance and Sexual Activity  . Alcohol use: No    Alcohol/week: 0.0 standard drinks    Comment: quit 20 years ago  . Drug use: No    Comment: quit 1994 (cocaine,pot,"anything could get hands on")  . Sexual activity: Yes  Other Topics Concern  . Not on file  Social History Narrative    Works lifting and caring wood back and forth to  his house and gets his exercise that way. Drinks 2 cupsa of caffeine during the day.   Social Determinants of Health   Financial Resource Strain:   . Difficulty of Paying Living Expenses: Not on file  Food Insecurity:   . Worried About Charity fundraiser in the Last Year: Not on file  . Ran Out of Food in the Last Year: Not on file  Transportation Needs:   . Lack of Transportation (Medical): Not on file  . Lack of Transportation (Non-Medical): Not on file  Physical Activity:   . Days of Exercise per Week: Not on file  . Minutes of Exercise per Session: Not on file  Stress:   . Feeling of Stress : Not on file  Social Connections:   . Frequency of Communication with Friends and Family: Not on file  . Frequency of Social Gatherings with Friends and Family: Not on file  . Attends Religious Services: Not on file  . Active Member of Clubs or Organizations: Not on file  . Attends Archivist Meetings: Not on file  . Marital Status: Not on file  Intimate Partner Violence:   . Fear of Current or Ex-Partner: Not on file  .  Emotionally Abused: Not on file  . Physically Abused: Not on file  . Sexually Abused: Not on file     PHYSICAL EXAM:  VS: BP (!) 144/73   Pulse 61   Ht 5\' 10"  (1.778 m)   Wt 170 lb (77.1 kg)   BMI 24.39 kg/m  Physical Exam Gen: NAD, alert, cooperative with exam, well-appearing MSK:  Left knee: Mild effusion pain Normal range of motion. Normal strength resistance. Neurovascular intact  Patient was fitted for a standard, cushioned, semi-rigid orthotic. The orthotic was heated and afterward the patient stood on the orthotic blank positioned on the orthotic stand. The patient was positioned in subtalar neutral position and 10 degrees of ankle dorsiflexion in a weight bearing stance. After completion of molding, a stable base was applied to the orthotic blank. The blank was ground to a stable position for weight bearing. Size: 10 Pairs: 2 Base:  Blue EVA Additional Posting and Padding: None The patient ambulated these, and they were very comfortable.    ASSESSMENT & PLAN:   Primary osteoarthritis of left knee Has fairly wide feet with loss of the transverse arch.  May be contributing to his underlying degenerative changes of his left knee. -Counseled supportive care. -Orthotics. -Could consider adding scaphoid pads.

## 2020-05-25 NOTE — Assessment & Plan Note (Signed)
Has fairly wide feet with loss of the transverse arch.  May be contributing to his underlying degenerative changes of his left knee. -Counseled supportive care. -Orthotics. -Could consider adding scaphoid pads.

## 2020-06-17 ENCOUNTER — Ambulatory Visit (INDEPENDENT_AMBULATORY_CARE_PROVIDER_SITE_OTHER): Payer: Medicare Other | Admitting: Sports Medicine

## 2020-06-17 ENCOUNTER — Other Ambulatory Visit: Payer: Self-pay

## 2020-06-17 DIAGNOSIS — M1712 Unilateral primary osteoarthritis, left knee: Secondary | ICD-10-CM | POA: Diagnosis not present

## 2020-06-17 NOTE — Progress Notes (Signed)
    Procedures performed today:    None.  Independent interpretation of notes and tests performed by another provider:   X-rays personally reviewed with patient, noted bilateral osteoarthritis worse on the left knee in the medial joint line, bone-on-bone medial tibiofemoral compartment.  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 He had preferred a nonpharmacologic approach so we had recommended a pescatarian diet, rehabilitation exercises, custom orthotics, and topical diclofenac, all of the above has resulted in a 80 to 90% improvement in symptoms, no change in plan, return as needed.    ___________________________________________ Gwen Her. Dianah Field, M.D., ABFM., CAQSM. Primary Care and [place] Instructor of [place] of [facility] of Medicine

## 2020-06-17 NOTE — Assessment & Plan Note (Signed)
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 He had preferred a nonpharmacologic approach so we had recommended a pescatarian diet, rehabilitation exercises, custom orthotics, and topical diclofenac, all of the above has resulted in a 80 to 90% improvement in symptoms, no change in plan, return as needed.

## 2020-08-19 DIAGNOSIS — L578 Other skin changes due to chronic exposure to nonionizing radiation: Secondary | ICD-10-CM | POA: Diagnosis not present

## 2020-08-19 DIAGNOSIS — L570 Actinic keratosis: Secondary | ICD-10-CM | POA: Diagnosis not present

## 2020-08-19 DIAGNOSIS — D225 Melanocytic nevi of trunk: Secondary | ICD-10-CM | POA: Diagnosis not present

## 2020-08-19 DIAGNOSIS — L821 Other seborrheic keratosis: Secondary | ICD-10-CM | POA: Diagnosis not present

## 2020-08-21 ENCOUNTER — Encounter: Payer: Self-pay | Admitting: Family Medicine

## 2020-08-21 ENCOUNTER — Other Ambulatory Visit: Payer: Self-pay

## 2020-08-21 ENCOUNTER — Ambulatory Visit (INDEPENDENT_AMBULATORY_CARE_PROVIDER_SITE_OTHER): Payer: Medicare Other | Admitting: Family Medicine

## 2020-08-21 DIAGNOSIS — B353 Tinea pedis: Secondary | ICD-10-CM | POA: Diagnosis not present

## 2020-08-21 DIAGNOSIS — I10 Essential (primary) hypertension: Secondary | ICD-10-CM

## 2020-08-21 NOTE — Patient Instructions (Signed)
Try lotrimin ultra to the toe.  Let me know if this persists Let me know if you have recurrent swelling of the lip/tongue

## 2020-08-23 ENCOUNTER — Encounter: Payer: Self-pay | Admitting: Family Medicine

## 2020-08-23 DIAGNOSIS — B353 Tinea pedis: Secondary | ICD-10-CM | POA: Insufficient documentation

## 2020-08-23 NOTE — Assessment & Plan Note (Signed)
Area on toe with appearance consistent with tinea.  Recommend OTC antifungal  Follow up if not improving or if worsening.

## 2020-08-23 NOTE — Progress Notes (Signed)
Nicholas Murillo - 75 y.o. male MRN [id number]  Date of birth: 11/16/1945  Subjective Chief Complaint  Patient presents with  . Toe Pain  . Oral Pain    HPI Nicholas Murillo is a 75 y.o. male here today for follow up of HTN.   He also has concern about redness around his toe.   HTN currently managed with losartan and metoprolol.  BP has remained well controlled with this.  He has had a couple of occasions where his tongue and lips had tingling and felt swollen.  No difficulty breathing.  Has not happened in several weeks.  He denies chest pain, shortness of breath, palpitations, headache or vision changes.    Redness around tip of 2nd toe of L foot with some peeling skin.  Sometimes has some pain.  Denies drainage or joint swelling.   ROS:  A comprehensive ROS was completed and negative except as noted per HPI  No Known Allergies  Past Medical History:  Diagnosis Date  . Aortic valve regurgitation 11/28/2018   Moderate echocardiogram May 2020  . Arthritis   . Esophageal stricture   . GERD (gastroesophageal reflux disease)   . Hiatal hernia   . Hyperlipidemia   . Hypertension     Past Surgical History:  Procedure Laterality Date  . APPENDECTOMY  1960  . arterial catheter  1997  . UPPER GASTROINTESTINAL ENDOSCOPY      Social History   Socioeconomic History  . Marital status: Significant Other    Spouse name: [name]  . Number of children: 2  . Years of education: 54  . Highest education level: Master's degree (e.g., MA, MS, MEng, MEd, MSW, MBA)  Occupational History  . Occupation: [employer]    Comment: retired  Tobacco Use  . Smoking status: Former Research scientist (life sciences)  . Smokeless tobacco: Never Used  Vaping Use  . Vaping Use: Never used  Substance and Sexual Activity  . Alcohol use: No    Alcohol/week: 0.0 standard drinks    Comment: quit 20 years ago  . Drug use: No    Comment: quit 1994 (cocaine,pot,"anything could get hands on")  . Sexual activity: Yes  Other Topics Concern   . 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 Drinks 2 cupsa of caffeine during the day.   Social Determinants of Health   Financial Resource Strain: Not on file  Food Insecurity: Not on file  Transportation Needs: Not on file  Physical Activity: Not on file  Stress: Not on file  Social Connections: Not on file    Family History  Problem Relation Age of Onset  . Skin cancer Father   . Colon polyps Mother        benign  . Dementia Mother   . Colon cancer Neg Hx   . Stomach cancer Neg Hx   . Esophageal cancer Neg Hx   . Rectal cancer Neg Hx     Health Maintenance  Topic Date Due  . Fecal DNA (Cologuard)  10/27/2018  . COVID-19 Vaccine (3 - Pfizer risk 4-dose series) 10/07/2019  . TETANUS/TDAP  02/08/2023  . INFLUENZA VACCINE  Completed  . Hepatitis C Screening  Completed  . PNA vac Low Risk Adult  Completed     ----------------------------------------------------------------------------------------------------------------------------------------------------------------------------------------------------------------- Physical Exam BP (!) 127/51 (BP Location: Left Arm, Patient Position: Sitting, Cuff Size: Normal)   Pulse (!) 56   Temp 98.3  F (36.8 C)   Wt 175 lb 1.6 oz (79.4 kg)   SpO2 98%   BMI 25.12 kg/m   Physical Exam Constitutional:      Appearance: Normal appearance.  Eyes:     General: No scleral icterus. Cardiovascular:     Rate and Rhythm: Normal rate and regular rhythm.  Pulmonary:     Effort: Pulmonary effort is normal.     Breath sounds: Normal breath sounds.  Musculoskeletal:     Cervical back: Neck supple.  Skin:    General: Skin is warm and dry.     Comments: Mild erythema with scaly/flaky skin over tip of 2nd digit of L foot.  No underlying ulceration.    Neurological:     General: No focal deficit present.     Mental Status: He is alert.   Psychiatric:        Mood and Affect: Mood normal.        Behavior: Behavior normal.     ------------------------------------------------------------------------------------------------------------------------------------------------------------------------------------------------------------------- Assessment and Plan  Essential hypertension, benign Blood pressure is at goal at for age and co-morbidities.  I recommend continuation of current medications. Discussed if he has another episode of lip/tongue swelling we should d/c losartan.   In addition they were instructed to follow a low sodium diet with regular exercise to help to maintain adequate control of blood pressure.    Tinea pedis Area on toe with appearance consistent with tinea.  Recommend OTC antifungal  Follow up if not improving or if worsening.     No orders of the defined types were placed in this encounter.   Return in about 6 months (around 02/18/2021) for HTN.    This visit occurred during the SARS-CoV-2 public health emergency.  Safety protocols were in place, including screening questions prior to the visit, additional usage of staff PPE, and extensive cleaning of exam room while observing appropriate contact time as indicated for disinfecting solutions.

## 2020-08-23 NOTE — Assessment & Plan Note (Signed)
Blood pressure is at goal at for age and co-morbidities.  I recommend continuation of current medications. Discussed if he has another episode of lip/tongue swelling we should d/c losartan.   In addition they were instructed to follow a low sodium diet with regular exercise to help to maintain adequate control of blood pressure.

## 2020-09-16 DIAGNOSIS — N400 Enlarged prostate without lower urinary tract symptoms: Secondary | ICD-10-CM | POA: Diagnosis not present

## 2020-09-24 DIAGNOSIS — R972 Elevated prostate specific antigen [PSA]: Secondary | ICD-10-CM | POA: Diagnosis not present

## 2020-09-24 DIAGNOSIS — N400 Enlarged prostate without lower urinary tract symptoms: Secondary | ICD-10-CM | POA: Diagnosis not present

## 2020-10-12 DIAGNOSIS — R972 Elevated prostate specific antigen [PSA]: Secondary | ICD-10-CM | POA: Diagnosis not present

## 2020-10-13 DIAGNOSIS — Z23 Encounter for immunization: Secondary | ICD-10-CM | POA: Diagnosis not present

## 2020-11-19 ENCOUNTER — Telehealth: Payer: Self-pay | Admitting: Family Medicine

## 2020-11-19 NOTE — Chronic Care Management (AMB) (Signed)
  Chronic Care Management   Outreach Note  11/19/2020 Name: Nicholas Murillo MRN: [id number] DOB: 26-Feb-1946  Referred by: [name], DO Reason for referral : No chief complaint on file.   An unsuccessful telephone outreach was attempted today. The patient was referred to the pharmacist for assistance with care management and care coordination.   Follow Up Plan:   Lauretta Grill Upstream Scheduler

## 2020-12-04 ENCOUNTER — Encounter: Payer: Self-pay | Admitting: *Deleted

## 2020-12-04 ENCOUNTER — Other Ambulatory Visit: Payer: Self-pay | Admitting: *Deleted

## 2020-12-04 DIAGNOSIS — I351 Nonrheumatic aortic (valve) insufficiency: Secondary | ICD-10-CM

## 2020-12-28 ENCOUNTER — Ambulatory Visit (HOSPITAL_COMMUNITY): Payer: Medicare Other | Attending: Cardiovascular Disease

## 2020-12-28 ENCOUNTER — Other Ambulatory Visit: Payer: Self-pay

## 2020-12-28 DIAGNOSIS — I351 Nonrheumatic aortic (valve) insufficiency: Secondary | ICD-10-CM | POA: Diagnosis not present

## 2020-12-28 LAB — ECHOCARDIOGRAM COMPLETE
AR max vel: 3.13 cm2
AV Area VTI: 3.1 cm2
AV Area mean vel: 3.35 cm2
AV Mean grad: 2 mmHg
AV Peak grad: 3.8 mmHg
Ao pk vel: 0.98 m/s
Area-P 1/2: 3 cm2
P 1/2 time: 496 msec
S' Lateral: 2.8 cm

## 2020-12-31 ENCOUNTER — Encounter: Payer: Self-pay | Admitting: *Deleted

## 2021-01-31 ENCOUNTER — Other Ambulatory Visit: Payer: Self-pay | Admitting: Family Medicine

## 2021-02-18 ENCOUNTER — Ambulatory Visit: Payer: Medicare Other | Admitting: Family Medicine

## 2021-02-23 ENCOUNTER — Encounter: Payer: Self-pay | Admitting: Family Medicine

## 2021-02-23 ENCOUNTER — Ambulatory Visit (INDEPENDENT_AMBULATORY_CARE_PROVIDER_SITE_OTHER): Payer: Medicare Other | Admitting: Family Medicine

## 2021-02-23 ENCOUNTER — Other Ambulatory Visit: Payer: Self-pay

## 2021-02-23 DIAGNOSIS — R7303 Prediabetes: Secondary | ICD-10-CM | POA: Diagnosis not present

## 2021-02-23 DIAGNOSIS — E785 Hyperlipidemia, unspecified: Secondary | ICD-10-CM

## 2021-02-23 DIAGNOSIS — I10 Essential (primary) hypertension: Secondary | ICD-10-CM | POA: Diagnosis not present

## 2021-02-23 DIAGNOSIS — R739 Hyperglycemia, unspecified: Secondary | ICD-10-CM | POA: Diagnosis not present

## 2021-02-23 DIAGNOSIS — Z0000 Encounter for general adult medical examination without abnormal findings: Secondary | ICD-10-CM

## 2021-02-23 DIAGNOSIS — I491 Atrial premature depolarization: Secondary | ICD-10-CM

## 2021-02-23 MED ORDER — SIMVASTATIN 20 MG PO TABS: 20.0000 mg | ORAL_TABLET | Freq: Every day | ORAL | 3 refills | Status: DC

## 2021-02-23 MED ORDER — LOSARTAN POTASSIUM 100 MG PO TABS: 100.0000 mg | ORAL_TABLET | Freq: Every day | ORAL | 3 refills | Status: DC

## 2021-02-23 NOTE — Progress Notes (Signed)
Nicholas Murillo - 75 y.o. male MRN [id number]  Date of birth: 04-15-1946  Subjective No chief complaint on file.   HPI Nicholas Murillo is a 75 year old male here today for follow-up visit.  Overall reports he is doing well.  Blood pressure currently managed with combination of losartan and metoprolol.  He is doing well with this at this time.  He does see cardiology as well.  Heart rate elevated with initial vitals today.  He denies any symptoms of racing heart, palpitations, chest pain or shortness of breath.  He continues to do well with simvastatin for management of hyperlipidemia.  No side effects noted with this.  ROS:  A comprehensive ROS was completed and negative except as noted per HPI  No Known Allergies  Past Medical History:  Diagnosis Date   Aortic valve regurgitation 11/28/2018   Moderate echocardiogram May 2020   Arthritis    Esophageal stricture    GERD (gastroesophageal reflux disease)    Hiatal hernia    Hyperlipidemia    Hypertension     Past Surgical History:  Procedure Laterality Date   APPENDECTOMY  1960   arterial catheter  1997   UPPER GASTROINTESTINAL ENDOSCOPY      Social History   Socioeconomic History   Marital status: Significant Other    Spouse name: [name]   Number of children: 2   Years of education: 18   Highest education level: Master's degree (e.g., MA, MS, MEng, MEd, MSW, MBA)  Occupational History   Occupation: [employer]    Comment: retired  Tobacco Use   Smoking status: Former   Smokeless tobacco: Never  Scientific laboratory technician Use: Never used  Substance and Sexual Activity   Alcohol use: No    Alcohol/week: 0.0 standard drinks    Comment: quit 20 years ago   Drug use: No    Comment: quit 1994 (cocaine,pot,"anything could get hands on")   Sexual activity: Yes  Other Topics Concern   Not on file  Social History Narrative    Works lifting and caring wood back and forth to his house and gets his exercise that way. Drinks 2 cupsa of caffeine  during the day.   Social Determinants of Health   Financial Resource Strain: Not on file  Food Insecurity: Not on file  Transportation Needs: Not on file  Physical Activity: Not on file  Stress: Not on file  Social Connections: Not on file    Family History  Problem Relation Age of Onset   Skin cancer Father    Colon polyps Mother        benign   Dementia Mother    Colon cancer Neg Hx    Stomach cancer Neg Hx    Esophageal cancer Neg Hx    Rectal cancer Neg Hx     Health Maintenance  Topic Date Due   Zoster Vaccines- Shingrix (1 of 2) Never done   Fecal DNA (Cologuard)  10/27/2018   COVID-19 Vaccine (3 - Pfizer risk series) 10/07/2019   INFLUENZA VACCINE  02/01/2021   TETANUS/TDAP  02/08/2023   Hepatitis C Screening  Completed   PNA vac Low Risk Adult  Completed   HPV VACCINES  Aged Out     ----------------------------------------------------------------------------------------------------------------------------------------------------------------------------------------------------------------- Physical Exam There were no vitals taken for this visit.  Physical Exam Constitutional:      Appearance: Normal appearance.  HENT:     Head: Normocephalic and atraumatic.  Eyes:     General: No scleral icterus. Cardiovascular:  Rate and Rhythm: Normal rate and regular rhythm.     Comments: Occasional extra beat noted. Pulmonary:     Effort: Pulmonary effort is normal.     Breath sounds: Normal breath sounds.  Musculoskeletal:     Cervical back: Neck supple.  Skin:    General: Skin is warm and dry.  Neurological:     General: No focal deficit present.     Mental Status: He is alert.  Psychiatric:        Mood and Affect: Mood normal.        Behavior: Behavior normal.   EKG: Normal sinus rhythm with occasional PAC.  No ST-T wave changes.  Normal PR and QTc intervals.  No significant change from previous  EKG ------------------------------------------------------------------------------------------------------------------------------------------------------------------------------------------------------------------- Assessment and Plan  Essential hypertension, benign Blood pressure remains well controlled with current medications.  We will continue losartan and metoprolol at current strength.  Low-sodium diet recommended.  PAC (premature atrial contraction) Elevated pulse noted on initial vitals however EKG with normal rate and occasional PACs.  No significant change from previous EKG.  Hyperlipidemia He is tolerating simvastatin well.  Update lipid panel today.  Prediabetes Rechecking A1c today.   Meds ordered this encounter  Medications   losartan (COZAAR) 100 MG tablet    Sig: Take 1 tablet (100 mg total) by mouth daily.    Dispense:  90 tablet    Refill:  3    Replaces lisinopril   simvastatin (ZOCOR) 20 MG tablet    Sig: Take 1 tablet (20 mg total) by mouth daily.    Dispense:  90 tablet    Refill:  3    Return in about 6 months (around 08/26/2021) for HTN/HLD.    This visit occurred during the SARS-CoV-2 public health emergency.  Safety protocols were in place, including screening questions prior to the visit, additional usage of staff PPE, and extensive cleaning of exam room while observing appropriate contact time as indicated for disinfecting solutions.

## 2021-02-23 NOTE — Assessment & Plan Note (Signed)
Elevated pulse noted on initial vitals however EKG with normal rate and occasional PACs.  No significant change from previous EKG.

## 2021-02-23 NOTE — Assessment & Plan Note (Signed)
Rechecking A1c today 

## 2021-02-23 NOTE — Assessment & Plan Note (Signed)
Blood pressure remains well controlled with current medications.  We will continue losartan and metoprolol at current strength.  Low-sodium diet recommended.

## 2021-02-23 NOTE — Assessment & Plan Note (Signed)
He is tolerating simvastatin well.  Update lipid panel today.

## 2021-02-24 DIAGNOSIS — L821 Other seborrheic keratosis: Secondary | ICD-10-CM | POA: Diagnosis not present

## 2021-02-24 DIAGNOSIS — I10 Essential (primary) hypertension: Secondary | ICD-10-CM | POA: Diagnosis not present

## 2021-02-24 DIAGNOSIS — L570 Actinic keratosis: Secondary | ICD-10-CM | POA: Diagnosis not present

## 2021-02-24 DIAGNOSIS — L578 Other skin changes due to chronic exposure to nonionizing radiation: Secondary | ICD-10-CM | POA: Diagnosis not present

## 2021-02-24 DIAGNOSIS — E785 Hyperlipidemia, unspecified: Secondary | ICD-10-CM | POA: Diagnosis not present

## 2021-02-24 DIAGNOSIS — R739 Hyperglycemia, unspecified: Secondary | ICD-10-CM | POA: Diagnosis not present

## 2021-02-25 LAB — CBC WITH DIFFERENTIAL/PLATELET
Absolute Monocytes: 623 cells/uL (ref 200–950)
Basophils Absolute: 68 cells/uL (ref 0–200)
Basophils Relative: 0.9 %
Eosinophils Absolute: 304 cells/uL (ref 15–500)
Eosinophils Relative: 4 %
HCT: 46.6 % (ref 38.5–50.0)
Hemoglobin: 15.7 g/dL (ref 13.2–17.1)
Lymphs Abs: 1596 cells/uL (ref 850–3900)
MCH: 32 pg (ref 27.0–33.0)
MCHC: 33.7 g/dL (ref 32.0–36.0)
MCV: 94.9 fL (ref 80.0–100.0)
MPV: 10.2 fL (ref 7.5–12.5)
Monocytes Relative: 8.2 %
Neutro Abs: 5008 cells/uL (ref 1500–7800)
Neutrophils Relative %: 65.9 %
Platelets: 126 10*3/uL — ABNORMAL LOW (ref 140–400)
RBC: 4.91 10*6/uL (ref 4.20–5.80)
RDW: 12 % (ref 11.0–15.0)
Total Lymphocyte: 21 %
WBC: 7.6 10*3/uL (ref 3.8–10.8)

## 2021-02-25 LAB — COMPLETE METABOLIC PANEL WITH GFR
AG Ratio: 1.8 (calc) (ref 1.0–2.5)
ALT: 18 U/L (ref 9–46)
AST: 17 U/L (ref 10–35)
Albumin: 4.2 g/dL (ref 3.6–5.1)
Alkaline phosphatase (APISO): 62 U/L (ref 35–144)
BUN/Creatinine Ratio: 29 (calc) — ABNORMAL HIGH (ref 6–22)
BUN: 26 mg/dL — ABNORMAL HIGH (ref 7–25)
CO2: 26 mmol/L (ref 20–32)
Calcium: 9.1 mg/dL (ref 8.6–10.3)
Chloride: 108 mmol/L (ref 98–110)
Creat: 0.91 mg/dL (ref 0.70–1.28)
Globulin: 2.4 g/dL (calc) (ref 1.9–3.7)
Glucose, Bld: 103 mg/dL — ABNORMAL HIGH (ref 65–99)
Potassium: 4.4 mmol/L (ref 3.5–5.3)
Sodium: 139 mmol/L (ref 135–146)
Total Bilirubin: 0.8 mg/dL (ref 0.2–1.2)
Total Protein: 6.6 g/dL (ref 6.1–8.1)
eGFR: 88 mL/min/{1.73_m2} (ref >=60)

## 2021-02-25 LAB — LIPID PANEL W/REFLEX DIRECT LDL
Cholesterol: 145 mg/dL (ref <200)
HDL: 43 mg/dL (ref >=40)
LDL Cholesterol (Calc): 82 mg/dL (calc)
Non-HDL Cholesterol (Calc): 102 mg/dL (calc) (ref <130)
Total CHOL/HDL Ratio: 3.4 (calc) (ref <5.0)
Triglycerides: 104 mg/dL (ref <150)

## 2021-02-25 LAB — TSH: TSH: 2.93 mIU/L (ref 0.40–4.50)

## 2021-02-25 LAB — HEMOGLOBIN A1C
Hgb A1c MFr Bld: 5.6 % of total Hgb (ref <5.7)
Mean Plasma Glucose: 114 mg/dL
eAG (mmol/L): 6.3 mmol/L

## 2021-03-15 DIAGNOSIS — Z23 Encounter for immunization: Secondary | ICD-10-CM | POA: Diagnosis not present

## 2021-03-18 ENCOUNTER — Telehealth: Payer: Self-pay | Admitting: Family Medicine

## 2021-03-18 NOTE — Telephone Encounter (Signed)
Left message for patient to call back and schedule Medicare Annual Wellness Visit (AWV) either virtually or in office.   Last AWV 07/08/19 please schedule at anytime with health coach

## 2021-03-31 ENCOUNTER — Encounter: Payer: Self-pay | Admitting: *Deleted

## 2021-03-31 DIAGNOSIS — I712 Thoracic aortic aneurysm, without rupture, unspecified: Secondary | ICD-10-CM

## 2021-04-09 DIAGNOSIS — N400 Enlarged prostate without lower urinary tract symptoms: Secondary | ICD-10-CM | POA: Diagnosis not present

## 2021-04-16 DIAGNOSIS — R972 Elevated prostate specific antigen [PSA]: Secondary | ICD-10-CM | POA: Diagnosis not present

## 2021-04-16 DIAGNOSIS — N400 Enlarged prostate without lower urinary tract symptoms: Secondary | ICD-10-CM | POA: Diagnosis not present

## 2021-04-21 DIAGNOSIS — I712 Thoracic aortic aneurysm, without rupture, unspecified: Secondary | ICD-10-CM | POA: Diagnosis not present

## 2021-04-22 ENCOUNTER — Other Ambulatory Visit: Payer: Self-pay | Admitting: Urology

## 2021-04-22 DIAGNOSIS — R972 Elevated prostate specific antigen [PSA]: Secondary | ICD-10-CM

## 2021-04-22 LAB — BASIC METABOLIC PANEL
BUN/Creatinine Ratio: 32 (calc) — ABNORMAL HIGH (ref 6–22)
BUN: 28 mg/dL — ABNORMAL HIGH (ref 7–25)
CO2: 27 mmol/L (ref 20–32)
Calcium: 9.2 mg/dL (ref 8.6–10.3)
Chloride: 107 mmol/L (ref 98–110)
Creat: 0.88 mg/dL (ref 0.70–1.28)
Glucose, Bld: 85 mg/dL (ref 65–139)
Potassium: 4.7 mmol/L (ref 3.5–5.3)
Sodium: 141 mmol/L (ref 135–146)

## 2021-04-26 ENCOUNTER — Ambulatory Visit (INDEPENDENT_AMBULATORY_CARE_PROVIDER_SITE_OTHER): Payer: Medicare Other

## 2021-04-26 ENCOUNTER — Other Ambulatory Visit: Payer: Self-pay

## 2021-04-26 DIAGNOSIS — I712 Thoracic aortic aneurysm, without rupture, unspecified: Secondary | ICD-10-CM

## 2021-04-26 DIAGNOSIS — R911 Solitary pulmonary nodule: Secondary | ICD-10-CM | POA: Diagnosis not present

## 2021-04-26 DIAGNOSIS — J9811 Atelectasis: Secondary | ICD-10-CM | POA: Diagnosis not present

## 2021-04-26 IMAGING — CT CT ANGIO CHEST
3 of 9 series · 18 of 46 positions shown · IV contrast (omnipaque)
Comparison: Prior CTA chest [DATE]

CLINICAL DATA: Thoracic aortic aneurysm follow-up

EXAM:
CT ANGIOGRAPHY CHEST WITH CONTRAST
TECHNIQUE: Multidetector CT imaging of the chest was performed using the
standard protocol during bolus administration of intravenous
contrast. Multiplanar CT image reconstructions and MIPs were
obtained to evaluate the vascular anatomy.
CONTRAST:  100mL OMNIPAQUE IOHEXOL 350 MG/ML SOLN

[Series 5: arterial · axial · arterial · 0.65mm/px · z∈[-325,-79]mm · 12 of 147 slices shown]
[im 12/147  lung]
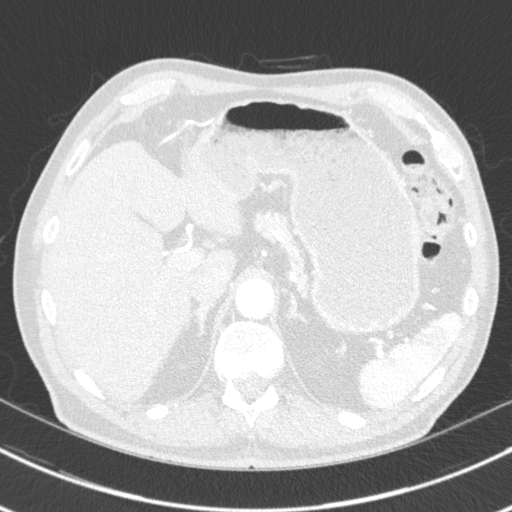
[im 23/147  soft-tissue]
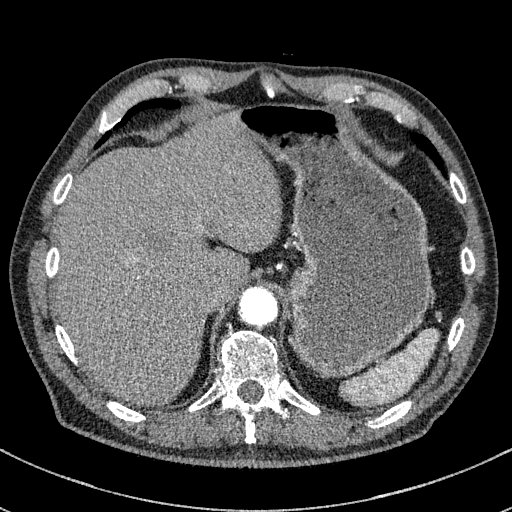
[im 34/147  lung]
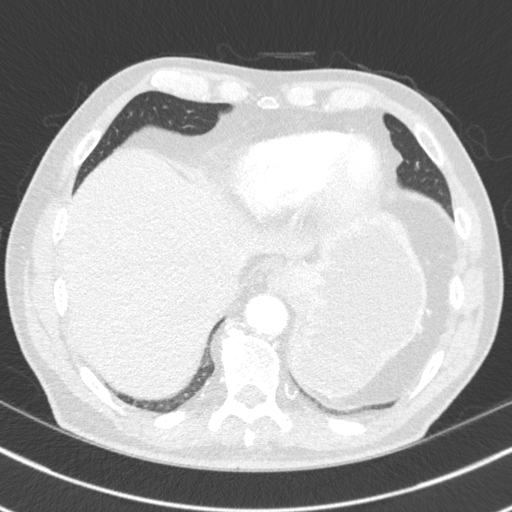
[im 45/147  soft-tissue]
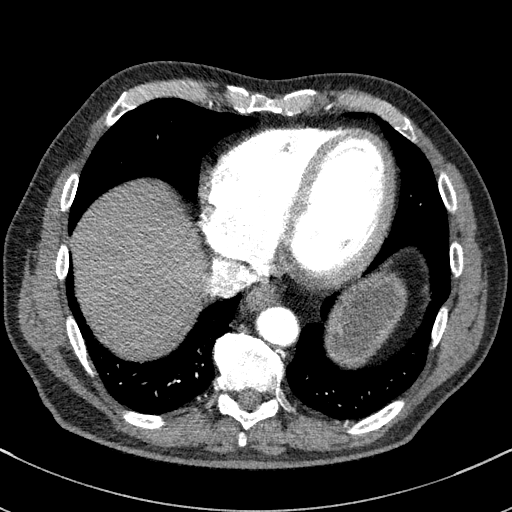
[im 57/147  lung]
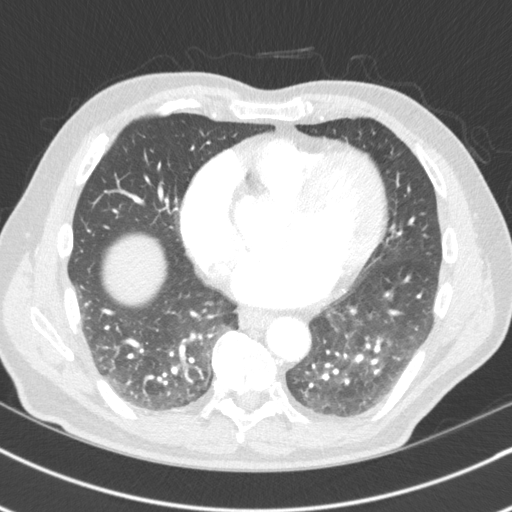
[im 68/147  soft-tissue]
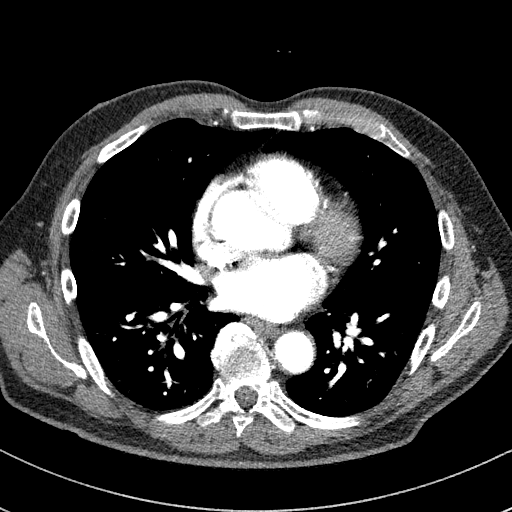
[im 79/147  lung]
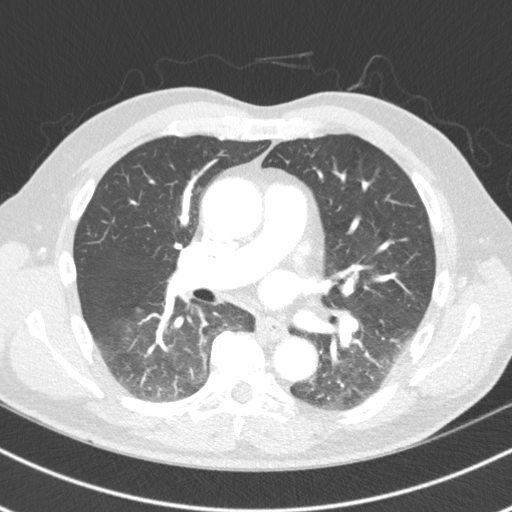
[im 90/147  soft-tissue]
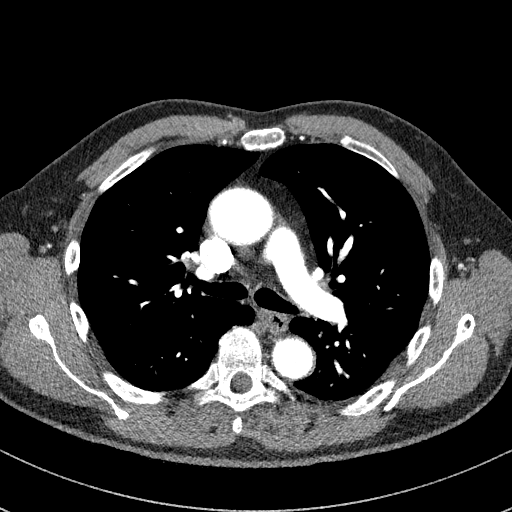
[im 102/147  lung]
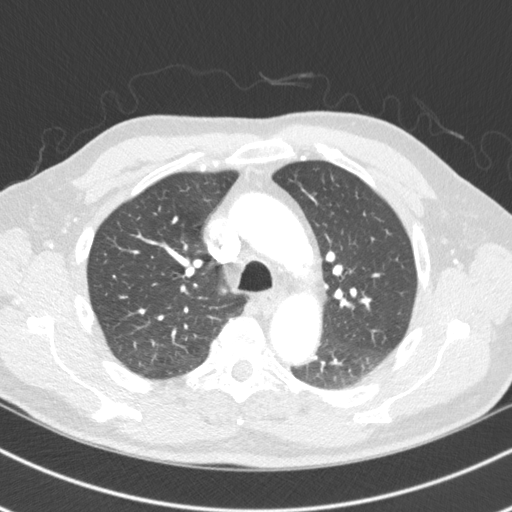
[im 113/147  soft-tissue]
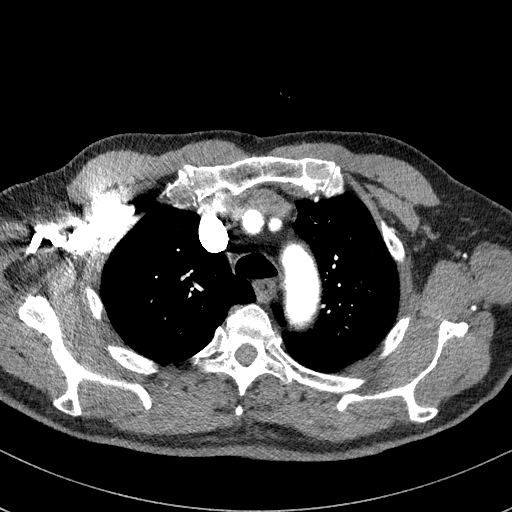
[im 124/147  lung]
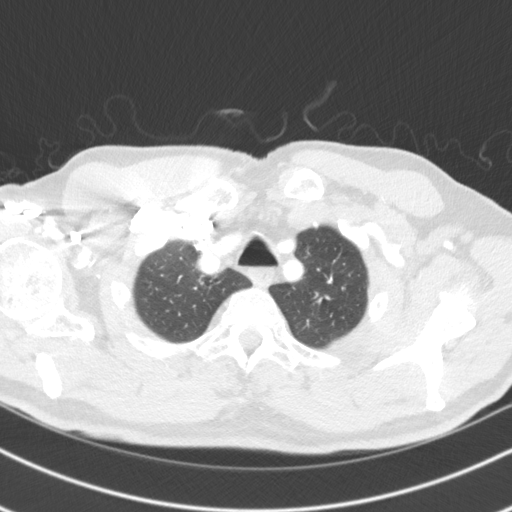
[im 135/147  soft-tissue]
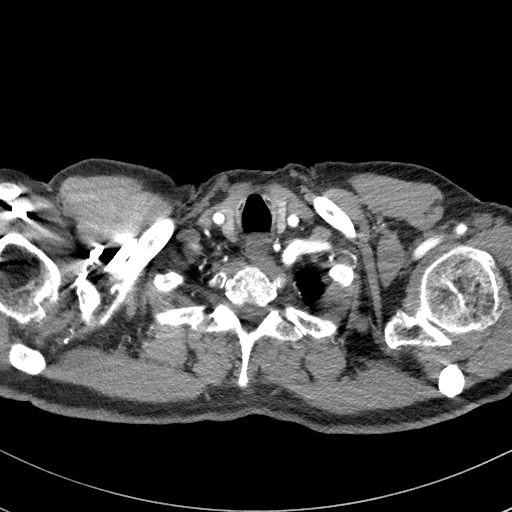

[Series 6: dissection lung · axial · 0.65mm/px · z∈[-327,-243]mm · 3 of 147 slices shown]
[im 11/147  soft-tissue]
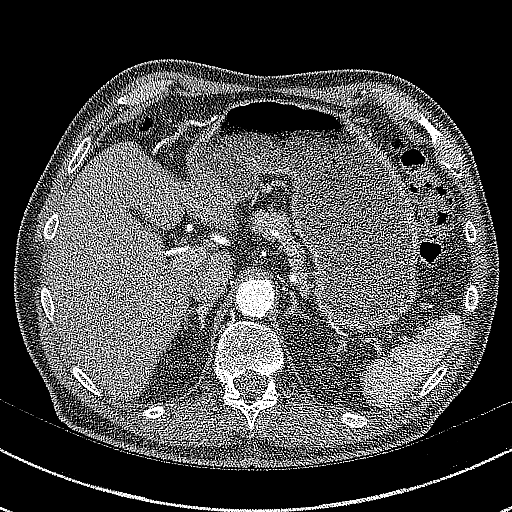
[im 32/147  soft-tissue]
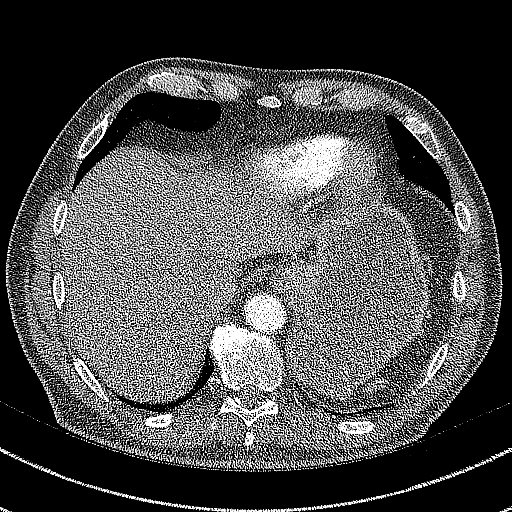
[im 53/147  soft-tissue]
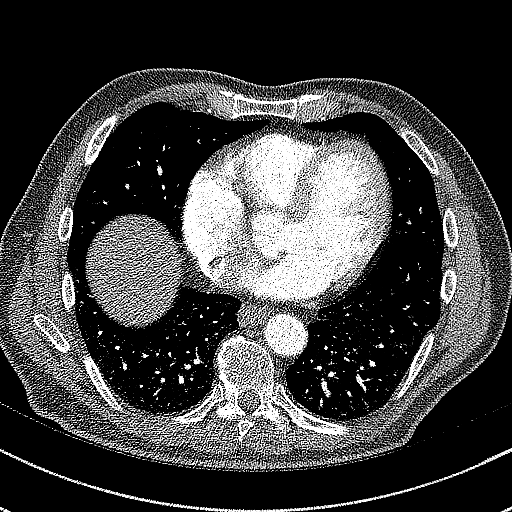

[Series 7: coronals · coronal · 0.58mm/px · 3 of 121 slices shown]
[im 31/121  soft-tissue]
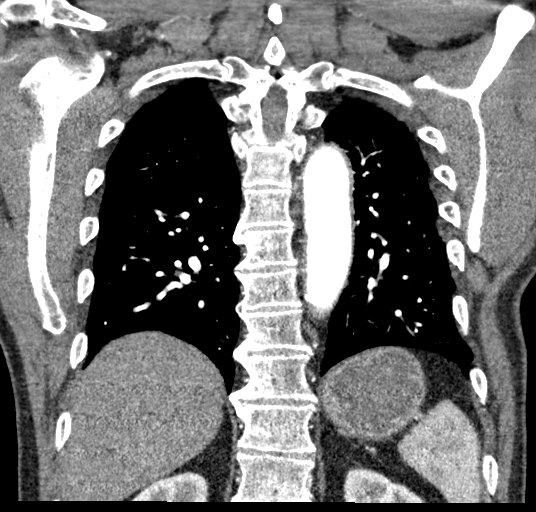
[im 61/121  soft-tissue]
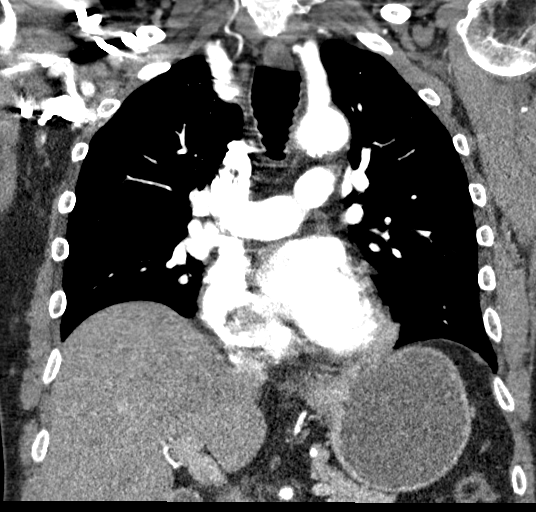
[im 91/121  soft-tissue]
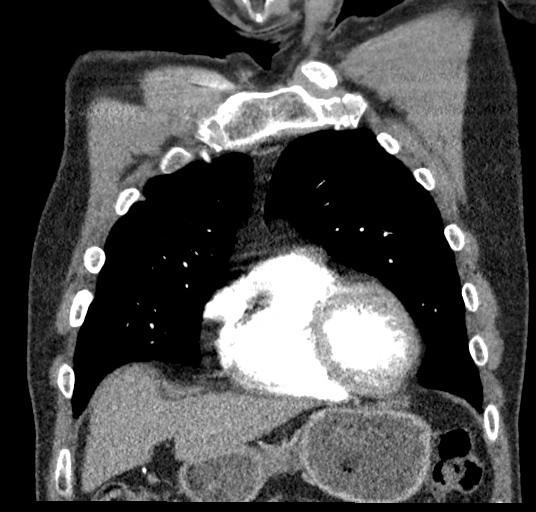

[18 of 46 positions shown; findings below may reference images not displayed]

FINDINGS: Cardiovascular: Moderate cardiac motion related artifact limits
evaluation of fine detail and precise measurement. Within these
limitations, there is no significant progression of very mild
aneurysmal disease of the ascending thoracic aorta. Maximal aortic
diameter remains 3.9-4.0 cm. No evidence of dissection. Conventional
3 vessel arch anatomy. Scattered atherosclerotic plaque.
Calcifications visualized along the coronary arteries. No
pericardial effusion. Unremarkable pulmonary arteries.

Mediastinum/Nodes: Unremarkable CT appearance of the thyroid gland.
No suspicious mediastinal or hilar adenopathy. No soft tissue
mediastinal mass. The thoracic esophagus is unremarkable.

Lungs/Pleura: Interval resolution of small posterior right upper
lobe pulmonary nodule. Tiny right anterior subpleural lymph node
remains unchanged and likely stable. Mild dependent atelectasis and
lower lobe bronchial wall thickening. No overt airspace infiltrate,
pulmonary edema, pleural effusion or pneumothorax.

Upper Abdomen: No acute abnormality within the visualized upper
abdomen.

Musculoskeletal: No acute fracture or aggressive appearing lytic or
blastic osseous lesion.

Review of the MIP images confirms the above findings.
IMPRESSION: 1. Cardiac motion artifact results in blurring of the aortic margins
and limits precise measurement. No significant progression of very
mild ectatic bordering on aneurysmal disease. The ascending thoracic
aorta measures a maximum of 3.9-4.0 cm. No evidence of dissection.
Aortic Atherosclerosis ([IN]-[IN]).
2. Coronary artery atherosclerotic calcifications.
3. Mild lower lobe atelectasis and bronchial wall thickening.
4. No acute abnormality.

## 2021-04-26 MED ORDER — IOHEXOL 350 MG/ML SOLN
100.0000 mL | Freq: Once | INTRAVENOUS | Status: AC | PRN
  Administered 2021-04-26: 100 mL via INTRAVENOUS

## 2021-04-27 DIAGNOSIS — Z23 Encounter for immunization: Secondary | ICD-10-CM | POA: Diagnosis not present

## 2021-04-27 NOTE — Progress Notes (Signed)
HPI: FU aortic insufficiency. 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  CTA October 2022 showed 3.9 - 4 cm ascending thoracic aorta, coronary artery calcification.  Since last seen there is dyspnea with more vigorous activities but no orthopnea, PND, pedal edema, exertional chest pain or syncope.  Current Outpatient Medications  Medication Sig Dispense Refill   aspirin EC 81 MG tablet Take 81 mg by mouth daily.     losartan (COZAAR) 100 MG tablet Take 1 tablet (100 mg total) by mouth daily. 90 tablet 3   magnesium oxide (MAG-OX) 400 MG tablet Take 800 mg by mouth daily.     metoprolol succinate (TOPROL-XL) 25 MG 24 hr tablet Take 1 tablet by mouth once daily 90 tablet 0   simvastatin (ZOCOR) 20 MG tablet Take 1 tablet (20 mg total) by mouth daily. 90 tablet 3   EPINEPHrine (EPIPEN 2-PAK) 0.3 mg/0.3 mL IJ SOAJ injection Inject 0.3 mLs (0.3 mg total) into the muscle once. (Patient not taking: Reported on 05/05/2021) 1 Device 0   No current facility-administered medications for this visit.     Past Medical History:  Diagnosis Date   Aortic valve regurgitation 11/28/2018   Moderate echocardiogram May 2020   Arthritis    Esophageal stricture    GERD (gastroesophageal reflux disease)    Hiatal hernia    Hyperlipidemia    Hypertension     Past Surgical History:  Procedure Laterality Date   APPENDECTOMY  1960   arterial catheter  1997   UPPER GASTROINTESTINAL ENDOSCOPY      Social History   Socioeconomic History   Marital status: Significant Other    Spouse name: [name]   Number of children: 2   Years of education: 18   Highest education level: Master's degree (e.g., MA, MS, MEng, MEd, MSW, MBA)  Occupational History   Occupation: [employer]    Comment: retired   Tobacco Use   Smoking status: Former   Smokeless tobacco: Never  Scientific laboratory technician Use: Never used  Substance and Sexual Activity   Alcohol use: No    Alcohol/week: 0.0 standard drinks    Comment: quit 20 years ago   Drug use: No    Comment: quit 1994 (cocaine,pot,"anything could get hands on")   Sexual activity: Yes  Other Topics Concern   Not on file  Social History Narrative    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 Drinks 2 cupsa of caffeine during the day.   Social Determinants of Health   Financial Resource Strain: Not on file  Food Insecurity: Not on file  Transportation Needs: Not on file  Physical Activity: Not on file  Stress: Not on file  Social Connections: Not on file  Intimate Partner Violence: Not on file    Family History  Problem Relation Age of Onset   Skin cancer Father    Colon polyps Mother        benign   Dementia Mother    Colon cancer Neg Hx    Stomach cancer Neg Hx    Esophageal cancer Neg Hx    Rectal cancer Neg Hx     ROS: no fevers or chills, productive cough, hemoptysis, dysphasia, odynophagia, melena, hematochezia, dysuria, hematuria,  rash, seizure activity, orthopnea, PND, pedal edema, claudication. Remaining systems are negative.  Physical Exam: Well-developed well-nourished in no acute distress.  Skin is warm and dry.  HEENT is normal.  Neck is supple.  Chest is clear to auscultation with normal expansion.  Cardiovascular exam is regular rate and rhythm.  2/6 systolic murmur left sternal border and 1/6 diastolic murmur. Abdominal exam nontender or distended. No masses palpated. Extremities show no edema. neuro grossly intact    A/P  1 aortic insufficiency-remains mild to moderate. Plan fu echo 6/22.  2 dilated ascending aorta-patient will need follow-up CTA in October 2023.  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  He states he will discuss  this with Dr. Zigmund Daniel to arrange this.  4 hypertension-patient's blood pressure is controlled today.  Continue present medications and follow-up.  5 hyperlipidemia-continue statin.  6 palpitations-continue beta blocker.   Kirk Ruths, MD

## 2021-05-05 ENCOUNTER — Encounter: Payer: Self-pay | Admitting: Cardiology

## 2021-05-05 ENCOUNTER — Other Ambulatory Visit: Payer: Self-pay

## 2021-05-05 ENCOUNTER — Ambulatory Visit (INDEPENDENT_AMBULATORY_CARE_PROVIDER_SITE_OTHER): Payer: Medicare Other | Admitting: Cardiology

## 2021-05-05 VITALS — BP 130/60 | HR 61 | Ht 70.5 in | Wt 170.0 lb

## 2021-05-05 DIAGNOSIS — I10 Essential (primary) hypertension: Secondary | ICD-10-CM

## 2021-05-05 DIAGNOSIS — R002 Palpitations: Secondary | ICD-10-CM | POA: Diagnosis not present

## 2021-05-05 DIAGNOSIS — I351 Nonrheumatic aortic (valve) insufficiency: Secondary | ICD-10-CM | POA: Diagnosis not present

## 2021-05-05 DIAGNOSIS — I712 Thoracic aortic aneurysm, without rupture, unspecified: Secondary | ICD-10-CM | POA: Diagnosis not present

## 2021-05-05 NOTE — Patient Instructions (Signed)
  Testing/Procedures:  Your physician has requested that you have an echocardiogram. Echocardiography is a painless test that uses sound waves to create images of your heart. It provides your doctor with information about the size and shape of your heart and how well your heart's chambers and valves are working. This procedure takes approximately one hour. There are no restrictions for this procedure. [place] June 2023   Follow-Up: At [facility], you and your health needs are our priority.  As part of our continuing mission to provide you with exceptional heart care, we have created designated Provider Care Teams.  These Care Teams include your primary Cardiologist (physician) and Advanced Practice Providers (APPs -  Physician Assistants and Nurse Practitioners) who all work together to provide you with the care you need, when you need it.  We recommend signing up for the patient portal called "MyChart".  Sign up information is provided on this After Visit Summary.  MyChart is used to connect with patients for Virtual Visits (Telemedicine).  Patients are able to view lab/test results, encounter notes, upcoming appointments, etc.  Non-urgent messages can be sent to your provider as well.   To learn more about what you can do with MyChart, go to [email or web address].    Your next appointment:   12 month(s)  The format for your next appointment:   In Person  Provider:   Kirk Ruths, MD

## 2021-05-06 ENCOUNTER — Telehealth: Payer: Self-pay | Admitting: Cardiology

## 2021-05-06 ENCOUNTER — Other Ambulatory Visit: Payer: Self-pay

## 2021-05-06 DIAGNOSIS — K862 Cyst of pancreas: Secondary | ICD-10-CM

## 2021-05-06 NOTE — Progress Notes (Signed)
Pt called requesting MRI pancreas per Cardiologist office visit. Follow-up for cystic lesion in the pancreas. Per Imaging: MRI abdomen w/ & w/0. Pancreatic protocol  Orders placed.

## 2021-05-06 NOTE — Telephone Encounter (Signed)
Left message for pt to call if he would like for [place] to order the MRI. According to dr Jacalyn Lefevre note from yesterday he was going to discuss with his medical doctor about getting the MRI scheduled.

## 2021-05-06 NOTE — Telephone Encounter (Signed)
Pt is calling back to adivse Dr. Stanford Breed that he have not had a MRI within the last 2 years

## 2021-05-06 NOTE — Telephone Encounter (Signed)
Please review and advise.

## 2021-05-09 ENCOUNTER — Ambulatory Visit
Admission: RE | Admit: 2021-05-09 | Discharge: 2021-05-09 | Disposition: A | Discharge status: Home / Self Care | Payer: Medicare Other | Source: Ambulatory Visit | Attending: Urology | Admitting: Urology

## 2021-05-09 DIAGNOSIS — R972 Elevated prostate specific antigen [PSA]: Secondary | ICD-10-CM

## 2021-05-09 DIAGNOSIS — R9721 Rising PSA following treatment for malignant neoplasm of prostate: Secondary | ICD-10-CM | POA: Diagnosis not present

## 2021-05-09 DIAGNOSIS — K5730 Diverticulosis of large intestine without perforation or abscess without bleeding: Secondary | ICD-10-CM | POA: Diagnosis not present

## 2021-05-09 IMAGING — MR MR PROSTATE WO/W CM
12 series · 48 of 48 positions shown · IV contrast (16ML MULTI)
Comparison: None

CLINICAL DATA: A 75-year-old male presents for evaluation of
elevated PSA, most recent PSA of 6.10. Biopsy performed recently, no
report available.

EXAM:
MR PROSTATE WITHOUT AND WITH CONTRAST
TECHNIQUE: Multiplanar multisequence MRI images were obtained of the pelvis
centered about the prostate. Pre and post contrast images were
obtained.
CONTRAST:  16mL MULTIHANCE GADOBENATE DIMEGLUMINE 529 MG/ML IV SOLN

[Series 3: T2 · coronal · 3.0mm · 0.56mm/px · 1 of 29 slices shown (1 of 3)]
[im 1/29]
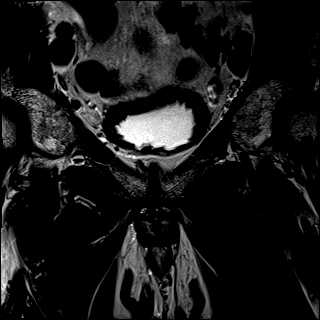

[Series 4: T1 · axial · 5.0mm · 1.25mm/px · 1 of 80 slices shown]
[im 1/80]
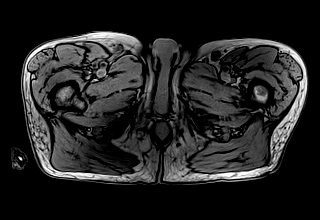

[Series 5: DWI · axial · 3.0mm · 1.75mm/px · z∈[-75,+6]mm · 2 of 84 slices shown (1 of 3)]
[im 1/84]
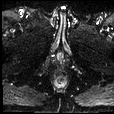
[im 84/84]
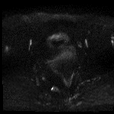

[Series 6: DWI · axial · 3.0mm · 1.75mm/px · 1 of 28 slices shown (2 of 3)]
[im 1/28]
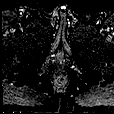

[Series 7: DWI · axial · 3.0mm · 1.75mm/px · 1 of 28 slices shown (3 of 3)]
[im 1/28]
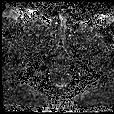

[Series 8: T2 · axial · 3.0mm · 0.56mm/px · 1 of 28 slices shown (2 of 3)]
[im 1/28]
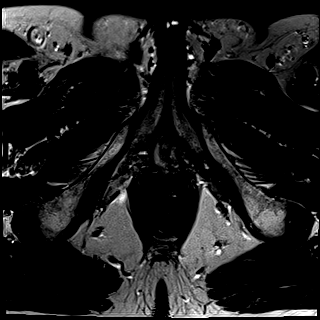

[Series 9: T2 · axial · 1.0mm · 1.04mm/px · z∈[-74,+5]mm · 2 of 80 slices shown (3 of 3)]
[im 1/80]
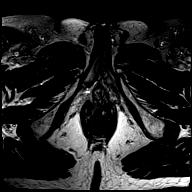
[im 80/80]
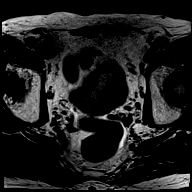

[Series 10: pre t1_twist_tra_dyn · axial · non-contrast · 3.5mm · 0.89mm/px · 1 of 22 slices shown]
[im 1/22]
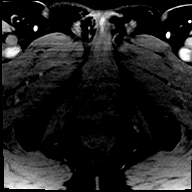

[Series 11: post t1_twist_tra_dyn-copy center · axial · non-contrast · 3.5mm · 0.89mm/px · z∈[-71,+2]mm · 17 of 660 slices shown]
[im 1/660]
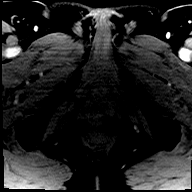
[im 42/660]
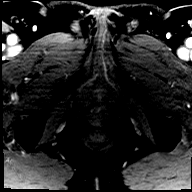
[im 83/660]
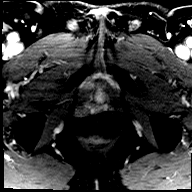
[im 124/660]
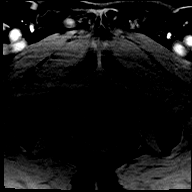
[im 165/660]
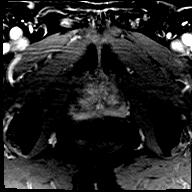
[im 206/660]
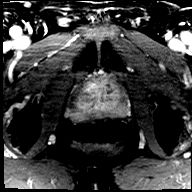
[im 248/660]
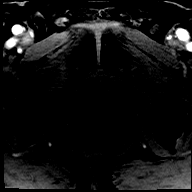
[im 289/660]
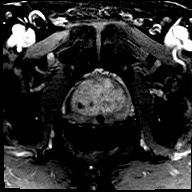
[im 330/660]
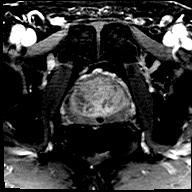
[im 371/660]
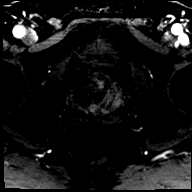
[im 412/660]
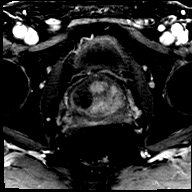
[im 454/660]
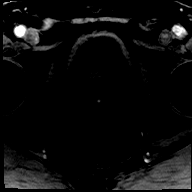
[im 495/660]
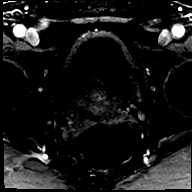
[im 536/660]
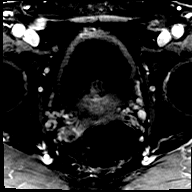
[im 577/660]
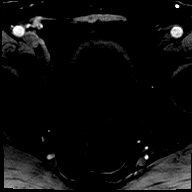
[im 618/660]
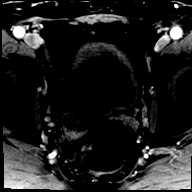
[im 660/660]
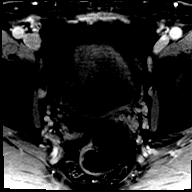

[Series 12: post t1_twist_tra_dyn-copy cent_sub · axial · 3.5mm · 0.89mm/px · z∈[-71,+2]mm · 17 of 637 slices shown]
[im 1/637]
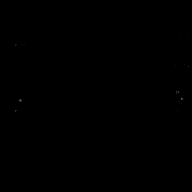
[im 40/637]
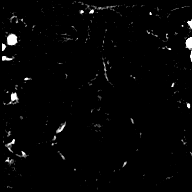
[im 80/637]
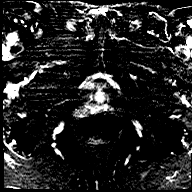
[im 120/637]
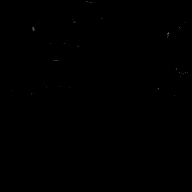
[im 160/637]
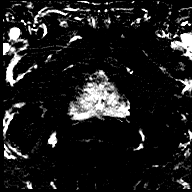
[im 199/637]
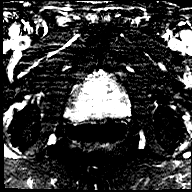
[im 239/637]
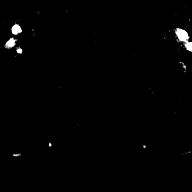
[im 279/637]
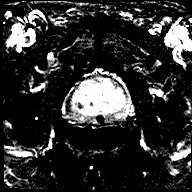
[im 319/637]
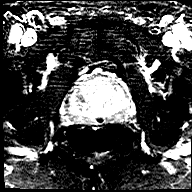
[im 358/637]
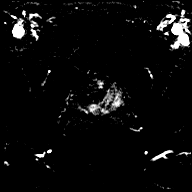
[im 398/637]
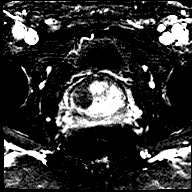
[im 438/637]
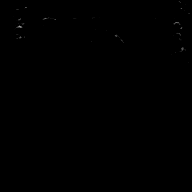
[im 478/637]
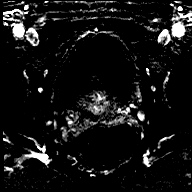
[im 517/637]
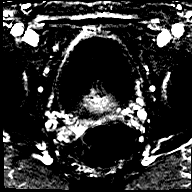
[im 557/637]
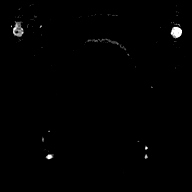
[im 597/637]
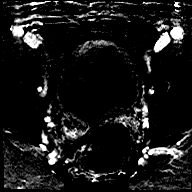
[im 637/637]
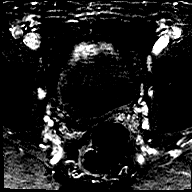

[Series 13: t1_vibe_dixon_tra_f · axial · 2.5mm · 0.91mm/px · z∈[-88,+109]mm · 2 of 80 slices shown]
[im 1/80]
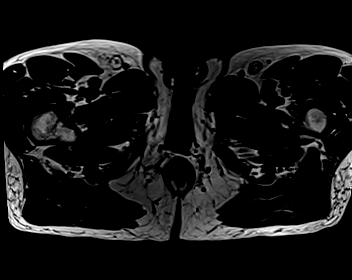
[im 80/80]
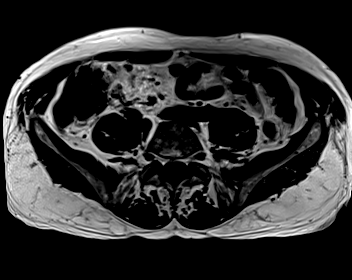

[Series 14: t1_vibe_dixon_tra_w · axial · 2.5mm · 0.91mm/px · z∈[-88,+109]mm · 2 of 80 slices shown]
[im 1/80]
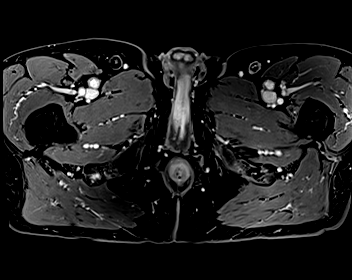
[im 80/80]
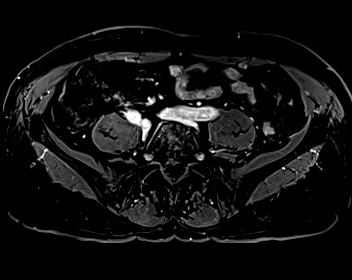

[48 of 48 positions shown; findings below may reference images not displayed]

FINDINGS: Prostate:

Transitional zone: BPH nodules of varying signal intensity on T2
scattered diffusely about the expanded transitional zone. No
high-risk lesion.

Peripheral zone: Relatively symmetric linear and wedge-shaped T2
hypointensity in the peripheral zone mainly posteriorly. No capsular
bulging. Diffusion-weighted imaging limited due to adjacent rectal
gas but without visible high risk lesion accounting for limitations

Dynamic contrast enhanced imaging also without focal area of
enhancement to suggest high risk lesion in the peripheral zone.

Volume: 6.3 x 4.3 x 6.3 (volume = 89 cc) cm

Transcapsular spread:  Absent

Seminal vesicle involvement: Absent

Neurovascular bundle involvement: Absent

Pelvic adenopathy: Absent

Bone metastasis: Absent

Other findings: Colonic diverticulosis.
IMPRESSION: No signs of high-risk lesion. Overall assessment PIRADS category 2
changes of BPH and presumed sequela of prior prostatitis.

Colonic diverticulosis.

## 2021-05-09 MED ORDER — GADOBENATE DIMEGLUMINE 529 MG/ML IV SOLN
16.0000 mL | Freq: Once | INTRAVENOUS | Status: AC | PRN
  Administered 2021-05-09: 16 mL via INTRAVENOUS

## 2021-05-17 ENCOUNTER — Other Ambulatory Visit: Payer: Self-pay

## 2021-05-17 ENCOUNTER — Ambulatory Visit: Payer: Medicare Other

## 2021-05-17 DIAGNOSIS — K5730 Diverticulosis of large intestine without perforation or abscess without bleeding: Secondary | ICD-10-CM | POA: Diagnosis not present

## 2021-05-17 DIAGNOSIS — N281 Cyst of kidney, acquired: Secondary | ICD-10-CM | POA: Diagnosis not present

## 2021-05-17 DIAGNOSIS — K862 Cyst of pancreas: Secondary | ICD-10-CM | POA: Diagnosis not present

## 2021-05-17 IMAGING — MR MR ABDOMEN WO/W CM
16 of 17 series · 42 of 48 positions shown · IV contrast (gadavist)
Comparison: MRI [DATE]

CLINICAL DATA: Follow-up pancreatic cystic lesion.

EXAM:
MRI ABDOMEN WITHOUT AND WITH CONTRAST
TECHNIQUE: Multiplanar multisequence MR imaging of the abdomen was performed
both before and after the administration of intravenous contrast.
CONTRAST:  8mL GADAVIST GADOBUTROL 1 MMOL/ML IV SOLN

[Series 5: cor ssfse / · coronal · 7.0mm · 1.48mm/px · 1 of 34 slices shown]
[im 1/34]
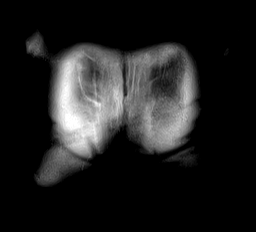

[Series 6: T2 · axial · 6.0mm · 1.48mm/px · z∈[-106,+131]mm · 2 of 34 slices shown (1 of 2)]
[im 1/34]
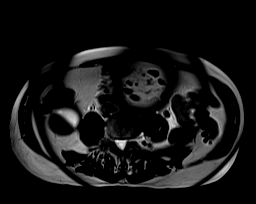
[im 34/34]
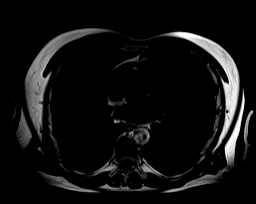

[Series 8: T2 · coronal · 1.5mm · 0.94mm/px · 4 of 80 slices shown (2 of 2)]
[im 1/80]
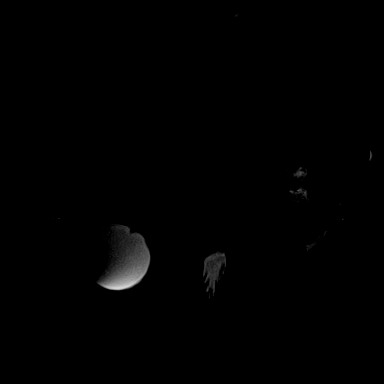
[im 27/80]
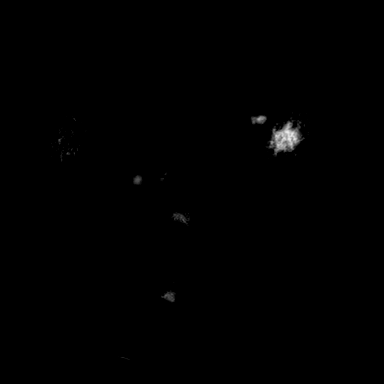
[im 53/80]
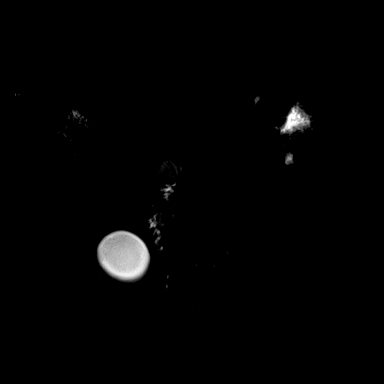
[im 80/80]
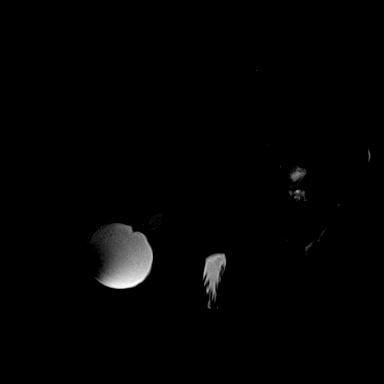

[Series 10: T1 · axial · 6.0mm · 0.74mm/px · z∈[-113,+139]mm · 3 of 72 slices shown]
[im 1/72]
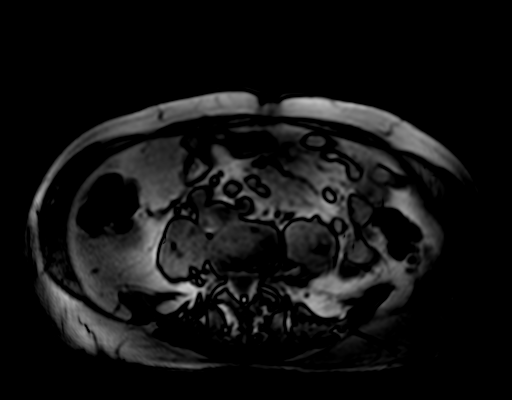
[im 36/72]
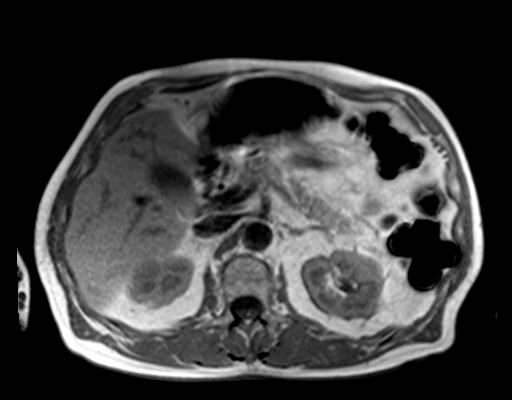
[im 72/72]
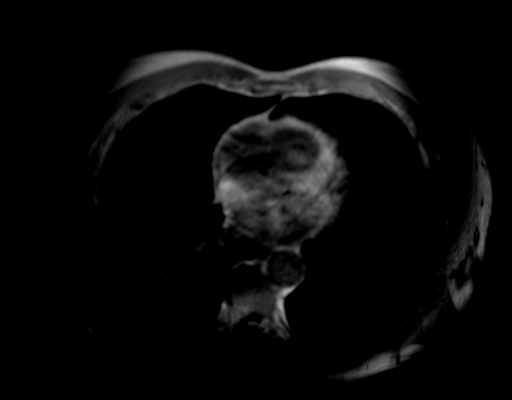

[Series 12: bSSFP · axial · 6.0mm · 0.74mm/px · z∈[-113,+139]mm · 2 of 36 slices shown]
[im 1/36]
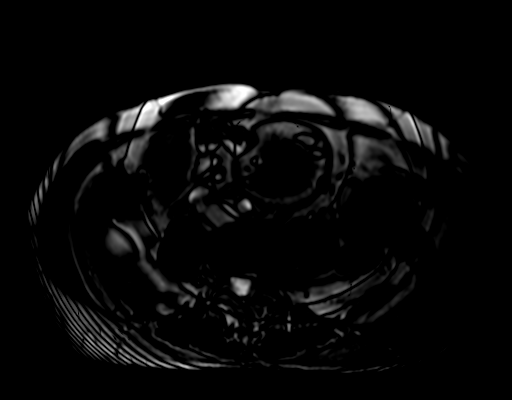
[im 36/36]
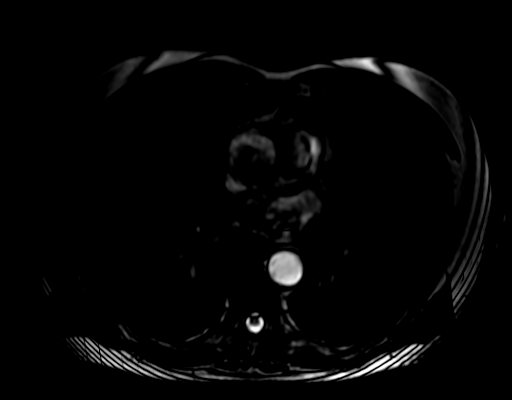

[Series 13: axial ssfse / · axial · 6.0mm · 1.19mm/px · z∈[-113,+139]mm · 2 of 36 slices shown]
[im 1/36]
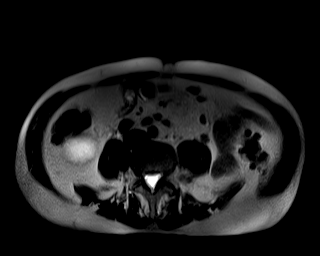
[im 36/36]
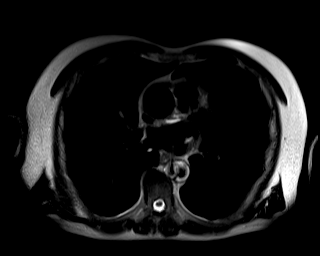

[Series 19: axial dynamic pre · axial · non-contrast · 3.5mm · 1.25mm/px · z∈[-143,+106]mm · 3 of 72 slices shown]
[im 1/72]
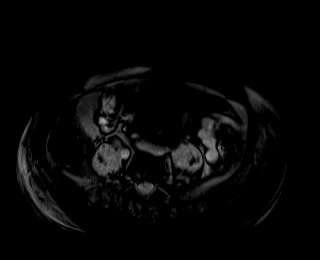
[im 36/72]
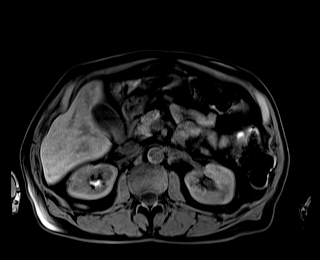
[im 72/72]
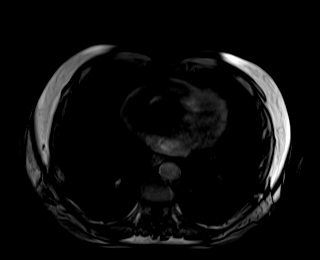

[Series 20: axial dynamic post · axial · 3.5mm · 1.25mm/px · z∈[-143,+106]mm · 3 of 72 slices shown (1 of 6)]
[im 1/72]
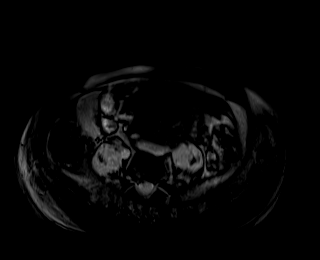
[im 36/72]
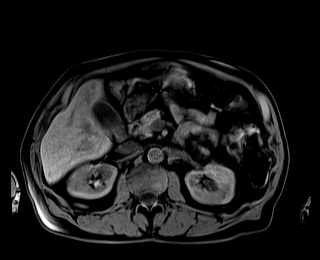
[im 72/72]
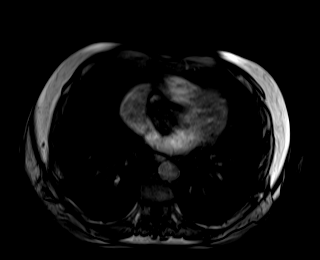

[Series 21: axial dynamic post · axial · 3.5mm · 1.25mm/px · z∈[-143,+106]mm · 3 of 72 slices shown (2 of 6)]
[im 1/72]
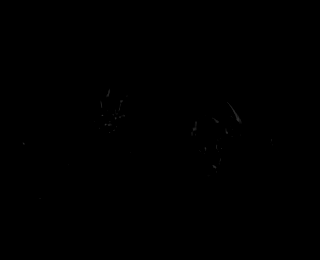
[im 36/72]
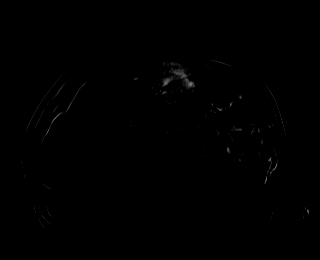
[im 72/72]
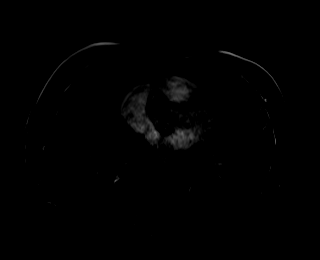

[Series 22: axial dynamic post · axial · 3.5mm · 1.25mm/px · z∈[-143,+106]mm · 3 of 72 slices shown (3 of 6)]
[im 1/72]
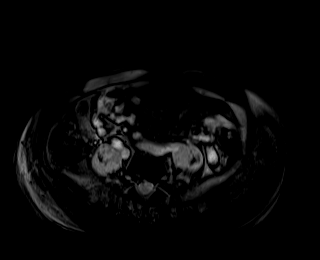
[im 36/72]
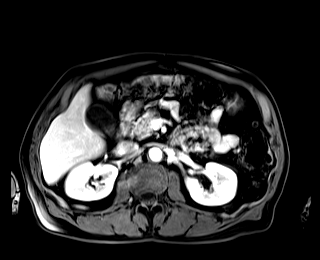
[im 72/72]
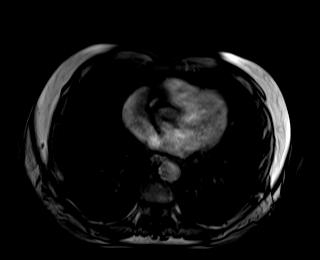

[Series 23: axial dynamic post · axial · 3.5mm · 1.25mm/px · z∈[-143,+106]mm · 3 of 72 slices shown (4 of 6)]
[im 1/72]
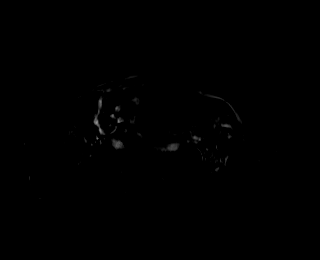
[im 36/72]
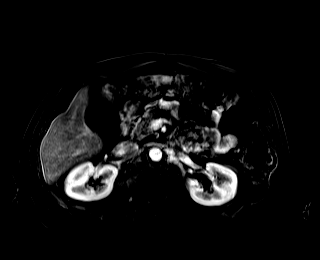
[im 72/72]
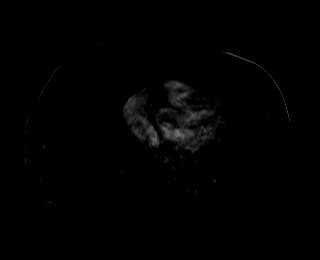

[Series 24: axial dynamic post · axial · 3.5mm · 1.25mm/px · z∈[-143,+106]mm · 3 of 72 slices shown (5 of 6)]
[im 1/72]
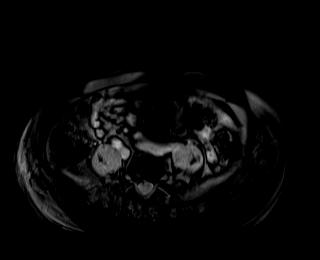
[im 36/72]
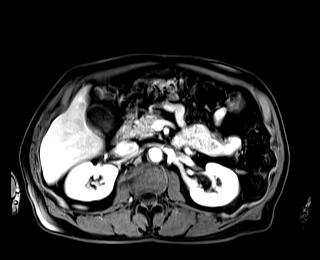
[im 72/72]
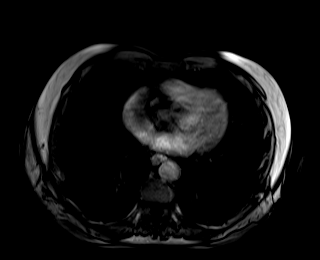

[Series 25: axial dynamic post · axial · 3.5mm · 1.25mm/px · z∈[-143,+106]mm · 3 of 72 slices shown (6 of 6)]
[im 1/72]
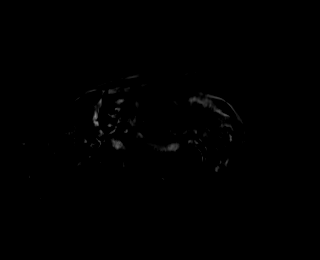
[im 36/72]
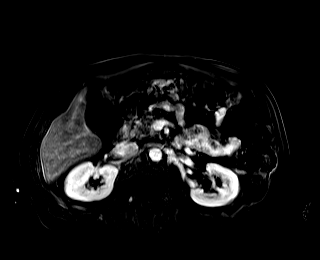
[im 72/72]
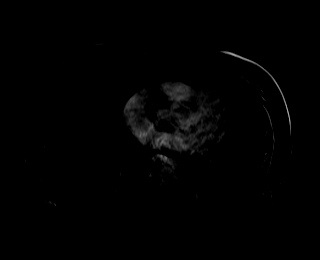

[Series 27: axial dynamic delayed · axial · 3.5mm · 1.25mm/px · z∈[-143,+106]mm · 3 of 72 slices shown]
[im 1/72]
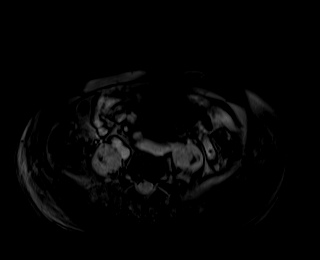
[im 36/72]
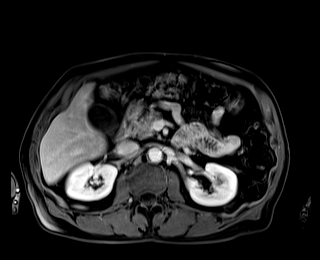
[im 72/72]
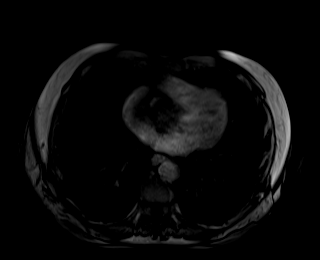

[Series 28: axial dynamic delayed_sub · axial · 3.5mm · 1.25mm/px · z∈[-143,+106]mm · 3 of 72 slices shown]
[im 1/72]
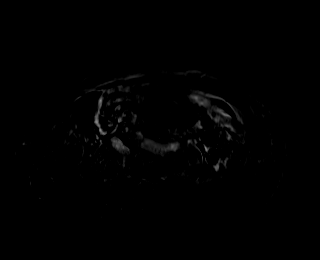
[im 36/72]
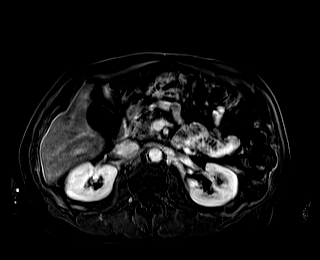
[im 72/72]
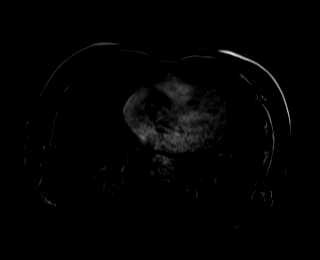

[Series 100: spin · axial · 0.74mm/px · 1 of 41 slices shown]
[im 1/41]
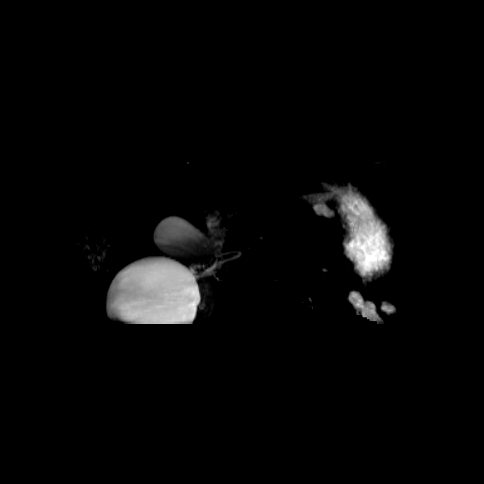

[42 of 48 positions shown; findings below may reference images not displayed]

FINDINGS: Lower chest: No acute abnormality.

Hepatobiliary: No hepatic steatosis. No suspicious hepatic lesion.
Gallbladder is unremarkable. No biliary ductal dilation.

Pancreas: Similar size of the well-circumscribed cystic lesion in
the tail the pancreas which measures 10 mm on today's examination
previously 9 mm. No suspicious wall thickening, septal thickening or
soft tissue enhancement. No pancreatic ductal dilation.

Spleen:  Within normal limits in size and appearance.

Adrenals/Urinary Tract: Bilateral adrenal glands are unremarkable.
Prominence of the right renal pelvis and collecting system is stable
dating back to [38] and may reflect chronic UPJ obstruction. Left
renal sinus cysts. 6.9 cm right renal cyst. No solid enhancing renal
mass.

Stomach/Bowel: Colonic diverticulosis without findings of acute
diverticulitis. Stomach is unremarkable for degree of distension. No
pathologic dilation or evidence of acute inflammation involving
loops of small or large bowel in the abdomen.

Vascular/Lymphatic: No pathologically enlarged lymph nodes
identified. No abdominal aortic aneurysm demonstrated.

Other:  No abdominal ascites.

Musculoskeletal: No suspicious bone lesions identified.
IMPRESSION: 1. Similar size of the well-circumscribed 10 mm cystic lesion in the
tail the pancreas, without suspicious MRI features. Likely
reflecting a side branch IPMN. Recommend follow up pre and post
contrast MRI/MRCP in 2 years. This recommendation follows ACR
consensus guidelines: Management of Incidental Pancreatic Cysts: A
White Paper of the ACR Incidental Findings Committee. [HOSPITAL] [38];[DATE].
2. Colonic diverticulosis without findings of acute diverticulitis.

## 2021-05-17 MED ORDER — GADOBUTROL 1 MMOL/ML IV SOLN
8.0000 mL | Freq: Once | INTRAVENOUS | Status: AC | PRN
  Administered 2021-05-17: 8 mL via INTRAVENOUS

## 2021-05-26 ENCOUNTER — Other Ambulatory Visit: Payer: Self-pay | Admitting: Family Medicine

## 2021-07-30 ENCOUNTER — Other Ambulatory Visit: Payer: Self-pay | Admitting: Family Medicine

## 2021-08-25 DIAGNOSIS — L578 Other skin changes due to chronic exposure to nonionizing radiation: Secondary | ICD-10-CM | POA: Diagnosis not present

## 2021-08-25 DIAGNOSIS — L821 Other seborrheic keratosis: Secondary | ICD-10-CM | POA: Diagnosis not present

## 2021-08-25 DIAGNOSIS — L570 Actinic keratosis: Secondary | ICD-10-CM | POA: Diagnosis not present

## 2021-08-26 ENCOUNTER — Other Ambulatory Visit: Payer: Self-pay

## 2021-08-26 ENCOUNTER — Ambulatory Visit (INDEPENDENT_AMBULATORY_CARE_PROVIDER_SITE_OTHER): Payer: Medicare Other | Admitting: Family Medicine

## 2021-08-26 ENCOUNTER — Encounter: Payer: Self-pay | Admitting: Family Medicine

## 2021-08-26 VITALS — BP 126/64 | HR 60 | Temp 98.3°F | Ht 70.0 in | Wt 173.0 lb

## 2021-08-26 DIAGNOSIS — I7300 Raynaud's syndrome without gangrene: Secondary | ICD-10-CM | POA: Diagnosis not present

## 2021-08-26 DIAGNOSIS — E782 Mixed hyperlipidemia: Secondary | ICD-10-CM | POA: Diagnosis not present

## 2021-08-26 DIAGNOSIS — I10 Essential (primary) hypertension: Secondary | ICD-10-CM | POA: Diagnosis not present

## 2021-08-26 MED ORDER — METOPROLOL SUCCINATE ER 25 MG PO TB24: 25.0000 mg | ORAL_TABLET | Freq: Every day | ORAL | 3 refills | Status: DC

## 2021-08-26 NOTE — Assessment & Plan Note (Signed)
Normal pulses and extremities appear well-perfused.  History consistent with Raynaud's.  Discussed strategies to limit discoloration.

## 2021-08-26 NOTE — Progress Notes (Signed)
Nicholas Murillo - 76 y.o. male MRN [id number]  Date of birth: 12-Apr-1946  Subjective Chief Complaint  Patient presents with   Follow-up   Hypertension   Hyperlipidemia    HPI [name] is a 76 year old male here today for follow-up visit.  Doing well at this time.  He was at dermatologist office given the and noted some discoloration of his toes.  He reported this occurs from time to time.  Worse in cold weather.  He denies any pain associated with this.  Blood pressures remain well controlled with losartan and metoprolol.  He denies chest pain, shortness of breath, palpitations, headaches or vision changes.  Tolerating simvastatin well for management of hyperlipidemia.  ROS:  A comprehensive ROS was completed and negative except as noted per HPI  No Known Allergies  Past Medical History:  Diagnosis Date   Aortic valve regurgitation 11/28/2018   Moderate echocardiogram May 2020   Arthritis    Esophageal stricture    GERD (gastroesophageal reflux disease)    Hiatal hernia    Hyperlipidemia    Hypertension     Past Surgical History:  Procedure Laterality Date   APPENDECTOMY  1960   arterial catheter  1997   UPPER GASTROINTESTINAL ENDOSCOPY      Social History   Socioeconomic History   Marital status: Significant Other    Spouse name: [name]   Number of children: 2   Years of education: 18   Highest education level: Master's degree (e.g., MA, MS, MEng, MEd, MSW, MBA)  Occupational History   Occupation: [employer]    Comment: retired  Tobacco Use   Smoking status: Former   Smokeless tobacco: Never  Scientific laboratory technician Use: Never used  Substance and Sexual Activity   Alcohol use: No    Alcohol/week: 0.0 standard drinks    Comment: quit 20 years ago   Drug use: No    Comment: quit 1994 (cocaine,pot,"anything could get hands on")   Sexual activity: Yes  Other Topics Concern   Not on file  Social History Narrative    Works lifting and caring wood back and forth to his  house and gets his exercise that way. Drinks 2 cupsa of caffeine during the day.   Social Determinants of Health   Financial Resource Strain: Not on file  Food Insecurity: Not on file  Transportation Needs: Not on file  Physical Activity: Not on file  Stress: Not on file  Social Connections: Not on file    Family History  Problem Relation Age of Onset   Skin cancer Father    Colon polyps Mother        benign   Dementia Mother    Colon cancer Neg Hx    Stomach cancer Neg Hx    Esophageal cancer Neg Hx    Rectal cancer Neg Hx     Health Maintenance  Topic Date Due   Zoster Vaccines- Shingrix (1 of 2) Never done   TETANUS/TDAP  02/08/2023   COLONOSCOPY (Pts 45-31yrs Insurance coverage will need to be confirmed)  07/16/2028   Pneumonia Vaccine 69+ Years old  Completed   INFLUENZA VACCINE  Completed   COVID-19 Vaccine  Completed   Hepatitis C Screening  Completed   HPV VACCINES  Aged Out   Fecal DNA (Cologuard)  Discontinued     ----------------------------------------------------------------------------------------------------------------------------------------------------------------------------------------------------------------- Physical Exam BP 126/64    Pulse 60    Temp 98.3 F (36.8 C)    Ht 5\' 10"  (1.778 m)  Wt 173 lb (78.5 kg)    SpO2 97%    BMI 24.82 kg/m   Physical Exam Constitutional:      Appearance: Normal appearance.  Eyes:     General: No scleral icterus. Cardiovascular:     Rate and Rhythm: Normal rate and regular rhythm.  Pulmonary:     Effort: Pulmonary effort is normal.     Breath sounds: Normal breath sounds.  Musculoskeletal:     Cervical back: Neck supple.  Neurological:     General: No focal deficit present.     Mental Status: He is alert.  Psychiatric:        Mood and Affect: Mood normal.        Behavior: Behavior normal.     ------------------------------------------------------------------------------------------------------------------------------------------------------------------------------------------------------------------- Assessment and Plan  Essential hypertension, benign Blood pressure remains well controlled.  Recommend continuation of current medications.  Raynaud's phenomenon Normal pulses and extremities appear well-perfused.  History consistent with Raynaud's.  Discussed strategies to limit discoloration.  Hyperlipidemia Doing well with simvastatin.  Continue current treatment.   Meds ordered this encounter  Medications   metoprolol succinate (TOPROL-XL) 25 MG 24 hr tablet    Sig: Take 1 tablet (25 mg total) by mouth daily.    Dispense:  90 tablet    Refill:  3    Return in about 6 months (around 02/23/2022) for HTN/Labs.    This visit occurred during the SARS-CoV-2 public health emergency.  Safety protocols were in place, including screening questions prior to the visit, additional usage of staff PPE, and extensive cleaning of exam room while observing appropriate contact time as indicated for disinfecting solutions.

## 2021-08-26 NOTE — Assessment & Plan Note (Signed)
Blood pressure remains well-controlled.  Recommend continuation of current medications. 

## 2021-08-26 NOTE — Assessment & Plan Note (Signed)
Doing well with simvastatin.  Continue current treatment.

## 2021-09-15 DIAGNOSIS — H3561 Retinal hemorrhage, right eye: Secondary | ICD-10-CM | POA: Diagnosis not present

## 2021-09-15 DIAGNOSIS — H25813 Combined forms of age-related cataract, bilateral: Secondary | ICD-10-CM | POA: Diagnosis not present

## 2021-10-04 DIAGNOSIS — R972 Elevated prostate specific antigen [PSA]: Secondary | ICD-10-CM | POA: Diagnosis not present

## 2021-10-19 DIAGNOSIS — N401 Enlarged prostate with lower urinary tract symptoms: Secondary | ICD-10-CM | POA: Diagnosis not present

## 2021-10-19 DIAGNOSIS — R3915 Urgency of urination: Secondary | ICD-10-CM | POA: Diagnosis not present

## 2021-10-19 DIAGNOSIS — R972 Elevated prostate specific antigen [PSA]: Secondary | ICD-10-CM | POA: Diagnosis not present

## 2021-11-08 ENCOUNTER — Encounter: Payer: Self-pay | Admitting: Internal Medicine

## 2021-11-17 ENCOUNTER — Telehealth: Payer: Self-pay | Admitting: Internal Medicine

## 2021-11-17 NOTE — Telephone Encounter (Signed)
Reviewed.  Okay to schedule colonoscopy in [place].  The reason is "history of multiple colon polyps".  He should have a previsit first. ?Thanks ?Dr. Henrene Pastor ?

## 2021-11-17 NOTE — Telephone Encounter (Signed)
Hi Dr. Henrene Pastor, ? ? ?Patient called to schedule his recall colonoscopy however I did not see any updated recall assessment sheet. Please advise on scheduling. ? ?Thank you ?

## 2021-11-18 ENCOUNTER — Ambulatory Visit (AMBULATORY_SURGERY_CENTER): Payer: Medicare Other | Admitting: *Deleted

## 2021-11-18 VITALS — Ht 70.0 in | Wt 173.0 lb

## 2021-11-18 DIAGNOSIS — Z86010 Personal history of colonic polyps: Secondary | ICD-10-CM

## 2021-11-18 MED ORDER — NA SULFATE-K SULFATE-MG SULF 17.5-3.13-1.6 GM/177ML PO SOLN: 1.0000 | ORAL | 0 refills | Status: DC

## 2021-11-18 NOTE — Progress Notes (Signed)
Patient's pre-visit was done today over the phone with the patient. Name,DOB and address verified. Patient denies any allergies to Eggs and Soy. Patient denies any problems with anesthesia/sedation. Patient is not taking any diet pills or blood thinners. No home Oxygen. Insurance confirmed with patient. ? ?Prep instructions sent to pt's MyChart-pt is aware. Patient understands to call us back with any questions or concerns. Patient is aware of our care-partner policy.  ? ?EMMI education assigned to the patient for the procedure, sent to MyChart.  ? ?The patient is COVID-19 vaccinated.   ?

## 2021-12-02 ENCOUNTER — Ambulatory Visit (HOSPITAL_COMMUNITY): Payer: Medicare Other | Attending: Cardiology

## 2021-12-02 DIAGNOSIS — I351 Nonrheumatic aortic (valve) insufficiency: Secondary | ICD-10-CM

## 2021-12-02 LAB — ECHOCARDIOGRAM COMPLETE
Area-P 1/2: 3.17 cm2
MV M vel: 5.65 m/s
MV Peak grad: 127.7 mmHg
P 1/2 time: 523 msec
S' Lateral: 2.9 cm

## 2021-12-07 ENCOUNTER — Other Ambulatory Visit: Payer: Self-pay | Admitting: *Deleted

## 2021-12-07 DIAGNOSIS — I351 Nonrheumatic aortic (valve) insufficiency: Secondary | ICD-10-CM

## 2021-12-09 ENCOUNTER — Encounter: Payer: Medicare Other | Admitting: Internal Medicine

## 2021-12-11 ENCOUNTER — Encounter: Payer: Self-pay | Admitting: Emergency Medicine

## 2021-12-11 ENCOUNTER — Emergency Department (INDEPENDENT_AMBULATORY_CARE_PROVIDER_SITE_OTHER)
Admission: EM | Admit: 2021-12-11 | Discharge: 2021-12-11 | Disposition: A | Discharge status: Home / Self Care | Payer: Medicare Other | Source: Home / Self Care

## 2021-12-11 DIAGNOSIS — W5501XA Bitten by cat, initial encounter: Secondary | ICD-10-CM | POA: Diagnosis not present

## 2021-12-11 DIAGNOSIS — Z23 Encounter for immunization: Secondary | ICD-10-CM

## 2021-12-11 DIAGNOSIS — S41132A Puncture wound without foreign body of left upper arm, initial encounter: Secondary | ICD-10-CM | POA: Diagnosis not present

## 2021-12-11 DIAGNOSIS — L089 Local infection of the skin and subcutaneous tissue, unspecified: Secondary | ICD-10-CM

## 2021-12-11 MED ORDER — AMOXICILLIN-POT CLAVULANATE 875-125 MG PO TABS: 1.0000 | ORAL_TABLET | Freq: Two times a day (BID) | ORAL | 0 refills | Status: AC

## 2021-12-11 MED ORDER — TETANUS-DIPHTH-ACELL PERTUSSIS 5-2.5-18.5 LF-MCG/0.5 IM SUSY
0.5000 mL | PREFILLED_SYRINGE | Freq: Once | INTRAMUSCULAR | Status: AC
Start: 2021-12-11 — End: 2021-12-11
  Administered 2021-12-11: 0.5 mL via INTRAMUSCULAR

## 2021-12-11 NOTE — ED Provider Notes (Signed)
[facility]    CSN: [id number] Arrival date & time: 12/11/21  1343      History   Chief Complaint Chief Complaint  Patient presents with   Animal Bite    HPI Nicholas Murillo is a 76 y.o. male.   76yo male presents today due to swelling and pain to his L hand dorsum, onset this morning. Last night, was trying to detangle his cat's fur with shears. States she was not liking the procedure and bend over to bite him. Teeth caused a superficial puncture around his 4th knuckle. Pt cleaned it with alcohol last night. Upon awakening this morning, states it started to become red hot and tender with swelling. He states it is worse than it was this morning, but appears to be no change over the past 4 hours. Last tetanus vaccine was August 2014. Full ROM wrist and fingers, full sensation.    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 Authorizing Provider  amoxicillin-clavulanate (AUGMENTIN) 875-125 MG tablet Take 1 tablet by mouth every 12 (twelve) hours for 10 days. 12/11/21 12/21/21 Yes Daryn Hicks L, PA  aspirin EC 81 MG tablet Take 81 mg by mouth daily.   Yes [provider]  losartan (COZAAR) 100 MG tablet Take 1 tablet (100 mg total) by mouth daily. 02/23/21  Yes Everrett Coombe, DO  magnesium oxide (MAG-OX) 400 MG tablet Take 800 mg by mouth daily.   Yes [provider]  metoprolol succinate (TOPROL-XL) 25 MG 24 hr tablet Take 1 tablet (25 mg total) by mouth daily. 08/26/21 11/02/23 Yes Everrett Coombe, DO  simvastatin (ZOCOR) 20 MG tablet Take 1 tablet (20 mg total) by mouth daily. 02/23/21  Yes Everrett Coombe, DO  EPINEPHrine (EPIPEN 2-PAK) 0.3 mg/0.3 mL IJ SOAJ injection Inject 0.3 mLs (0.3 mg total) into the muscle once. Patient not taking: Reported on 05/05/2021 06/23/15   Palumbo, April, MD  Na Sulfate-K Sulfate-Mg Sulf 17.5-3.13-1.6 GM/177ML SOLN Take 1 kit by mouth as directed. May use generic SUPREP;NO prior authorizations will be done.Please use Singlecare or GOOD-RX coupon. 11/18/21   Hilarie Fredrickson, MD    Family History Family History  Problem Relation Age of Onset   Skin cancer Father  Colon polyps Mother        benign   Dementia Mother    Colon cancer Neg Hx    Stomach cancer Neg Hx    Esophageal cancer Neg Hx    Rectal cancer Neg Hx     Social History Social History   Tobacco Use   Smoking status: Former   Smokeless tobacco: Never  Scientific laboratory technician Use: Never used  Substance Use Topics   Alcohol use: No    Alcohol/week: 0.0 standard drinks of alcohol    Comment: quit 20 years ago   Drug use: No    Comment: quit 1994 (cocaine,pot,"anything could get hands on")     Allergies   Patient has no known allergies.   Review of  Systems Review of Systems  Skin:  Positive for wound.  As per hpi   Physical Exam Triage Vital Signs ED Triage Vitals  Enc Vitals Group     BP 12/11/21 1402 139/73     Pulse Rate 12/11/21 1402 73     Resp 12/11/21 1402 18     Temp 12/11/21 1402 99 F (37.2 C)     Temp Source 12/11/21 1402 Oral     SpO2 12/11/21 1402 94 %     Weight 12/11/21 1404 170 lb (77.1 kg)     Height 12/11/21 1404 $RemoveBefor'5\' 10"'vtoMvyjKZoUz$  (1.778 m)     Head Circumference --      Peak Flow --      Pain Score 12/11/21 1403 4     Pain Loc --      Pain Edu? --      Excl. in [place]? --    No data found.  Updated Vital Signs BP 139/73 (BP Location: Left Arm)   Pulse 73   Temp 99 F (37.2 C) (Oral)   Resp 18   Ht $R'5\' 10"'Bt$  (1.778 m)   Wt 170 lb (77.1 kg)   SpO2 94%   BMI 24.39 kg/m   Visual Acuity Right Eye Distance:   Left Eye Distance:   Bilateral Distance:    Right Eye Near:   Left Eye Near:    Bilateral Near:     Physical Exam Vitals and nursing note reviewed.  Constitutional:      General: He is not in acute distress.    Appearance: Normal appearance. He is normal weight. He is not ill-appearing or toxic-appearing.  HENT:     Head: Normocephalic and atraumatic.  Musculoskeletal:        General: Swelling, tenderness and signs of injury present. No deformity. Normal range of motion.     Comments: FROM wrist and fingers 6cm x 6cm area of warmth, erythema and swelling over the 4th knuckle dorsally. 2 small puncture wounds noted, closed and scabbed over No lymphangitis Pulses normal Cap refill <2sec  Skin:    General: Skin is warm.     Capillary Refill: Capillary refill takes less than 2 seconds.     Coloration: Skin is not jaundiced.     Findings: Erythema present. No bruising.  Neurological:     General: No focal deficit present.     Mental Status: He is alert and oriented to person, place, and time.     Sensory: No sensory deficit.     Motor: No weakness.      UC Treatments / Results   Labs (all labs ordered are listed, but only abnormal results are displayed) Labs Reviewed - No data to display  EKG  Radiology No results found.  Procedures Procedures (including critical care time)  Medications Ordered in UC Medications  Tdap (BOOSTRIX) injection 0.5 mL (0.5 mLs Intramuscular Given 12/11/21 1434)    Initial Impression / Assessment and Plan / UC Course  I have reviewed the triage vital signs and the nursing notes.  Pertinent labs & imaging results that were available during my care of the patient were reviewed by me and considered in my medical decision making (see chart for details).     Cat bite - puncture wound appears mild and shallow. Start augmentin BID x 10 days. Line drawn around infected area, if any migration outside of line, RTC. Follow up with PCP in 10 days. Tdap - updated in office. Last one 2014  Final Clinical Impressions(s) / UC Diagnoses   Final diagnoses:  Cat bite, initial encounter  Need for prophylactic vaccination with combined diphtheria-tetanus-pertussis (DTP) vaccine     Discharge Instructions      Your last tetanus was August 2014. Due to the bite, you were vaccinated against tetanus today. You need one every 10 years for prevention, every 5 years for cuts or wounds.  Your wound is infected. Please start taking Augmentin twice daily. Take with food to prevent an upset stomach. Please monitor for any worsening redness or swelling past the line drawn on your hand today. If you develop a fever or a red streak going up your arm, please head to the ER. Follow up with PCP in 1 week to ensure resolution    ED Prescriptions     Medication Sig Dispense Auth. Provider   amoxicillin-clavulanate (AUGMENTIN) 875-125 MG tablet Take 1 tablet by mouth every 12 (twelve) hours for 10 days. 20 tablet Kaedyn Belardo L, [place]      PDMP not reviewed this encounter.   Chaney Malling, [place] 12/11/21 1504

## 2021-12-11 NOTE — Discharge Instructions (Addendum)
Your last tetanus was August 2014. Due to the bite, you were vaccinated against tetanus today. You need one every 10 years for prevention, every 5 years for cuts or wounds.  Your wound is infected. Please start taking Augmentin twice daily. Take with food to prevent an upset stomach. Please monitor for any worsening redness or swelling past the line drawn on your hand today. If you develop a fever or a red streak going up your arm, please head to the ER. Follow up with PCP in 1 week to ensure resolution

## 2021-12-11 NOTE — ED Triage Notes (Signed)
Patient c/o cat bite on left hand since last night.  Left hand is red, swollen and uncomfortable.  Patient denies any pain meds.  Tdap is current.  Cat is current on rabies.

## 2022-01-10 ENCOUNTER — Encounter: Payer: Self-pay | Admitting: Internal Medicine

## 2022-01-10 ENCOUNTER — Ambulatory Visit (AMBULATORY_SURGERY_CENTER): Payer: Medicare Other | Admitting: Internal Medicine

## 2022-01-10 VITALS — BP 121/63 | HR 49 | Temp 97.7°F | Resp 10 | Ht 70.0 in | Wt 173.0 lb

## 2022-01-10 DIAGNOSIS — D122 Benign neoplasm of ascending colon: Secondary | ICD-10-CM

## 2022-01-10 DIAGNOSIS — Z09 Encounter for follow-up examination after completed treatment for conditions other than malignant neoplasm: Secondary | ICD-10-CM | POA: Diagnosis not present

## 2022-01-10 DIAGNOSIS — Z86010 Personal history of colonic polyps: Secondary | ICD-10-CM

## 2022-01-10 DIAGNOSIS — D128 Benign neoplasm of rectum: Secondary | ICD-10-CM | POA: Diagnosis not present

## 2022-01-10 DIAGNOSIS — I10 Essential (primary) hypertension: Secondary | ICD-10-CM | POA: Diagnosis not present

## 2022-01-10 DIAGNOSIS — E785 Hyperlipidemia, unspecified: Secondary | ICD-10-CM | POA: Diagnosis not present

## 2022-01-10 DIAGNOSIS — K621 Rectal polyp: Secondary | ICD-10-CM | POA: Diagnosis not present

## 2022-01-10 MED ORDER — SODIUM CHLORIDE 0.9 % IV SOLN: 500.0000 mL | Freq: Once | INTRAVENOUS | Status: DC

## 2022-01-10 NOTE — Progress Notes (Signed)
Called to room to assist during endoscopic procedure.  Patient ID and intended procedure confirmed with present staff. Received instructions for my participation in the procedure from the performing physician.  

## 2022-01-10 NOTE — Progress Notes (Signed)
Sedate, gd SR, tolerated procedure well, VSS, report to RN 

## 2022-01-10 NOTE — Progress Notes (Signed)
Pt's states no medical or surgical changes since previsit or office visit. 

## 2022-01-10 NOTE — Patient Instructions (Signed)
Handouts provided about polyps, hemorrhoids and diverticulosis.  Resume previous diet.  Continue present medications.  Await pathology results.  Repeat colonoscopy is not recommended for surveillance.  YOU HAD AN ENDOSCOPIC PROCEDURE TODAY AT [facility]:   Refer to the procedure report that was given to you for any specific questions about what was found during the examination.  If the procedure report does not answer your questions, please call your gastroenterologist to clarify.  If you requested that your care partner not be given the details of your procedure findings, then the procedure report has been included in a sealed envelope for you to review at your convenience later.  YOU SHOULD EXPECT: Some feelings of bloating in the abdomen. Passage of more gas than usual.  Walking can help get rid of the air that was put into your GI tract during the procedure and reduce the bloating. If you had a lower endoscopy (such as a colonoscopy or flexible sigmoidoscopy) you may notice spotting of blood in your stool or on the toilet paper. If you underwent a bowel prep for your procedure, you may not have a normal bowel movement for a few days.  Please Note:  You might notice some irritation and congestion in your nose or some drainage.  This is from the oxygen used during your procedure.  There is no need for concern and it should clear up in a day or so.  SYMPTOMS TO REPORT IMMEDIATELY:  Following lower endoscopy (colonoscopy or flexible sigmoidoscopy):  Excessive amounts of blood in the stool  Significant tenderness or worsening of abdominal pains  Swelling of the abdomen that is new, acute  Fever of 100F or higher   For urgent or emergent issues, a gastroenterologist can be reached at any hour by calling [phone]. Do not use MyChart messaging for urgent concerns.    DIET:  We do recommend a small meal at first, but then you may proceed to your regular diet.  Drink plenty of  fluids but you should avoid alcoholic beverages for 24 hours.  ACTIVITY:  You should plan to take it easy for the rest of today and you should NOT DRIVE or use heavy machinery until tomorrow (because of the sedation medicines used during the test).    FOLLOW UP: Our staff will call the number listed on your records the next business day following your procedure.  We will call around 7:15- 8:00 am to check on you and address any questions or concerns that you may have regarding the information given to you following your procedure. If we do not reach you, we will leave a message.  If you develop any symptoms (ie: fever, flu-like symptoms, shortness of breath, cough etc.) before then, please call [phone].  If you test positive for Covid 19 in the 2 weeks post procedure, please call and report this information to [place].    If any biopsies were taken you will be contacted by phone or by letter within the next 1-3 weeks.  Please call us at [phone] if you have not heard about the biopsies in 3 weeks.    SIGNATURES/CONFIDENTIALITY: You and/or your care partner have signed paperwork which will be entered into your electronic medical record.  These signatures attest to the fact that that the information above on your After Visit Summary has been reviewed and is understood.  Full responsibility of the confidentiality of this discharge information lies with you and/or your care-partner.

## 2022-01-10 NOTE — Progress Notes (Signed)
HISTORY OF PRESENT ILLNESS:  Nicholas Murillo is a 76 y.o. male with multiple adenomatous colon polyps.  Presents today for surveillance.  Last examination January 2020.  No complaints  REVIEW OF SYSTEMS:  All non-GI ROS negative. Past Medical History:  Diagnosis Date   Aortic valve regurgitation 11/28/2018   Moderate echocardiogram May 2020   Arthritis    Esophageal stricture    GERD (gastroesophageal reflux disease)    Hiatal hernia    Hyperlipidemia    Hypertension     Past Surgical History:  Procedure Laterality Date   APPENDECTOMY  1960   arterial catheter  1997   COLONOSCOPY  07/16/2018   Dr.Linn Goetze   POLYPECTOMY     UPPER GASTROINTESTINAL ENDOSCOPY      Social History Nicholas Murillo  reports that he has quit smoking. He has never used smokeless tobacco. He reports that he does not drink alcohol and does not use drugs.  family history includes Colon polyps in his mother; Dementia in his mother; Skin cancer in his father.  No Known Allergies     PHYSICAL EXAMINATION: Vital signs: BP 122/62   Pulse (!) 59   Temp 97.7 F (36.5 C) (Skin)   Ht '5\' 10"'$  (1.778 m)   Wt 173 lb (78.5 kg)   SpO2 97%   BMI 24.82 kg/m  General: Well-developed, well-nourished, no acute distress HEENT: Sclerae are anicteric, conjunctiva pink. Oral mucosa intact Lungs: Clear Heart: Regular Abdomen: soft, nontender, nondistended, no obvious ascites, no peritoneal signs, normal bowel sounds. No organomegaly. Extremities: No edema Psychiatric: alert and oriented x3. Cooperative      ASSESSMENT:  Personal history of multiple adenomatous colon polyps   PLAN:   Surveillance colonoscopy

## 2022-01-10 NOTE — Op Note (Signed)
[facility] Patient Name: Nicholas Murillo Procedure Date: 01/10/2022 2:13 PM MRN: [id number] Endoscopist: Docia Chuck. Henrene Pastor , MD Age: 76 Referring MD:  Date of Birth: 1945/12/27 Gender: Male Account #: [id number] Procedure:                Colonoscopy with cold snare polypectomy x 2 Indications:              High risk colon cancer surveillance: Personal                            history of multiple (3 or more) adenomas. Previous                            examination January 2020 Medicines:                Monitored Anesthesia Care Procedure:                Pre-Anesthesia Assessment:                           - Prior to the procedure, a History and Physical                            was performed, and patient medications and                            allergies were reviewed. The patient's tolerance of                            previous anesthesia was also reviewed. The risks                            and benefits of the procedure and the sedation                            options and risks were discussed with the patient.                            All questions were answered, and informed consent                            was obtained. Prior Anticoagulants: The patient has                            taken no previous anticoagulant or antiplatelet                            agents. ASA Grade Assessment: II - A patient with                            mild systemic disease. After reviewing the risks                            and benefits, the patient was deemed in  satisfactory condition to undergo the procedure.                           After obtaining informed consent, the colonoscope                            was passed under direct vision. Throughout the                            procedure, the patient's blood pressure, pulse, and                            oxygen saturations were monitored continuously. The                             Colonoscope was introduced through the anus and                            advanced to the the cecum, identified by                            appendiceal orifice and ileocecal valve. The                            ileocecal valve, appendiceal orifice, and rectum                            were photographed. The quality of the bowel                            preparation was good. The colonoscopy was performed                            without difficulty. The patient tolerated the                            procedure well. The bowel preparation used was                            SUPREP via split dose instruction. Scope In: 2:32:05 PM Scope Out: 2:46:52 PM Scope Withdrawal Time: 0 hours 11 minutes 41 seconds  Total Procedure Duration: 0 hours 14 minutes 47 seconds  Findings:                 Two polyps were found in the rectum and ascending                            colon. The polyps were 1 to 4 mm in size. These                            polyps were removed with a cold snare. Resection                            and retrieval were complete.  Multiple diverticula were found in the left colon.                           Internal hemorrhoids were found during retroflexion.                           The exam was otherwise without abnormality on                            direct and retroflexion views. Complications:            No immediate complications. Estimated blood loss:                            None. Estimated Blood Loss:     Estimated blood loss: none. Impression:               - Two 1 to 4 mm polyps in the rectum and in the                            ascending colon, removed with a cold snare.                            Resected and retrieved.                           - Diverticulosis in the left colon.                           - Internal hemorrhoids.                           - The examination was otherwise normal on direct                             and retroflexion views. Recommendation:           - Repeat colonoscopy is not recommended for                            surveillance.                           - Patient has a contact number available for                            emergencies. The signs and symptoms of potential                            delayed complications were discussed with the                            patient. Return to normal activities tomorrow.                            Written discharge instructions were provided to the  patient.                           - Resume previous diet.                           - Continue present medications.                           - Await pathology results. Docia Chuck. Henrene Pastor, MD 01/10/2022 2:51:27 PM This report has been signed electronically.

## 2022-01-11 ENCOUNTER — Telehealth: Payer: Self-pay

## 2022-01-11 NOTE — Telephone Encounter (Signed)
Left message on answering machine. 

## 2022-01-13 ENCOUNTER — Encounter: Payer: Self-pay | Admitting: Internal Medicine

## 2022-02-23 ENCOUNTER — Ambulatory Visit: Payer: Medicare Other | Admitting: Family Medicine

## 2022-02-23 ENCOUNTER — Encounter: Payer: Self-pay | Admitting: General Practice

## 2022-02-23 DIAGNOSIS — L570 Actinic keratosis: Secondary | ICD-10-CM | POA: Diagnosis not present

## 2022-02-23 DIAGNOSIS — L821 Other seborrheic keratosis: Secondary | ICD-10-CM | POA: Diagnosis not present

## 2022-02-23 DIAGNOSIS — D225 Melanocytic nevi of trunk: Secondary | ICD-10-CM | POA: Diagnosis not present

## 2022-02-23 DIAGNOSIS — L578 Other skin changes due to chronic exposure to nonionizing radiation: Secondary | ICD-10-CM | POA: Diagnosis not present

## 2022-02-24 ENCOUNTER — Ambulatory Visit (INDEPENDENT_AMBULATORY_CARE_PROVIDER_SITE_OTHER): Payer: Medicare Other | Admitting: Family Medicine

## 2022-02-24 ENCOUNTER — Encounter: Payer: Self-pay | Admitting: Family Medicine

## 2022-02-24 VITALS — BP 147/63 | HR 74 | Temp 98.0°F | Ht 70.0 in | Wt 174.0 lb

## 2022-02-24 DIAGNOSIS — E782 Mixed hyperlipidemia: Secondary | ICD-10-CM | POA: Diagnosis not present

## 2022-02-24 DIAGNOSIS — I351 Nonrheumatic aortic (valve) insufficiency: Secondary | ICD-10-CM

## 2022-02-24 DIAGNOSIS — R0600 Dyspnea, unspecified: Secondary | ICD-10-CM

## 2022-02-24 DIAGNOSIS — I10 Essential (primary) hypertension: Secondary | ICD-10-CM | POA: Diagnosis not present

## 2022-02-24 DIAGNOSIS — R7303 Prediabetes: Secondary | ICD-10-CM | POA: Diagnosis not present

## 2022-02-24 DIAGNOSIS — R972 Elevated prostate specific antigen [PSA]: Secondary | ICD-10-CM

## 2022-02-24 NOTE — Assessment & Plan Note (Signed)
He is having increased dyspnea.  Previous echo indicates that he symptoms are worsened some.  We will contact his cardiologist to update him as well.

## 2022-02-24 NOTE — Assessment & Plan Note (Signed)
Tolerating simvastatin well.  Continue at current strength.  Repeating lipid panel.

## 2022-02-24 NOTE — Progress Notes (Signed)
Nicholas Murillo - 76 y.o. male MRN [id number]  Date of birth: [birth year]  Subjective Chief Complaint  Patient presents with   Hypertension    HPI Nicholas Murillo is a 76 y.o. male here today for follow up visit.   Continues on losartan and metoprolol for management of HTN.  Tolerating these well at current strength.  BP is usually well controlled but is elevated today.  He does report having some increased dyspnea recently.  This is occurred over the past couple months.  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  He is followed by cardiology.  Per his last echo this seems to have worsened some.  He has not noticed any swelling.  Denies chest pain.  Continues to tolerate simvastatin well.  Denies side effects from this.  ROS:  A comprehensive ROS was completed and negative except as noted per HPI  No Known Allergies  Past Medical History:  Diagnosis Date   Aortic valve regurgitation 11/28/2018   Moderate echocardiogram May 2020   Arthritis    Esophageal stricture    GERD (gastroesophageal reflux disease)    Hiatal hernia    Hyperlipidemia    Hypertension     Past Surgical History:  Procedure Laterality Date   APPENDECTOMY  1960   arterial catheter  1997   COLONOSCOPY  07/16/2018   Dr.Perry   POLYPECTOMY     UPPER GASTROINTESTINAL ENDOSCOPY      Social History   Socioeconomic History   Marital status: Significant Other    Spouse name: [name]   Number of children: 2   Years of education: 18   Highest education level: Master's degree (e.g., MA, MS, MEng, MEd, MSW, MBA)  Occupational History   Occupation: [employer]    Comment: retired  Tobacco Use   Smoking status: Former   Smokeless tobacco: Never  Scientific laboratory technician Use: Never used  Substance and Sexual Activity   Alcohol use: No    Alcohol/week: 0.0 standard drinks of alcohol    Comment: quit 20 years ago   Drug use: No    Comment: quit 1994 (cocaine,pot,"anything could get hands on")   Sexual activity: Yes   Other Topics Concern   Not on file  Social History Narrative    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 Drinks 2 cupsa of caffeine during the day.   Social Determinants of Health   Financial Resource Strain: Low Risk  (07/02/2018)   Overall Financial Resource Strain (CARDIA)    Difficulty of Paying Living Expenses: Not hard at all  Food Insecurity: No Food Insecurity (07/02/2018)   Hunger Vital Sign    Worried About Running Out of Food in the Last Year: Never true    Ran Out of Food in the Last Year: Never true  Transportation Needs: No Transportation Needs (07/02/2018)   PRAPARE - Hydrologist (Medical): No    Lack of Transportation (Non-Medical): No  Physical Activity: Insufficiently Active (07/02/2018)   Exercise Vital Sign    Days of Exercise per Week: 6 days    Minutes of Exercise per Session: 20 min  Stress: No Stress Concern Present (07/02/2018)   [place]    Feeling of Stress : Not at all  Social Connections: Somewhat Isolated (07/02/2018)   Social Connection and Isolation Panel [NHANES]    Frequency of Communication with Friends and Family: Three  times a week    Frequency of Social Gatherings with Friends and Family: Never    Attends Religious Services: Never    Marine scientist or Organizations: No    Attends Music therapist: Never    Marital Status: Living with partner    Family History  Problem Relation Age of Onset   Skin cancer Father    Colon polyps Mother        benign   Dementia Mother    Colon cancer Neg Hx    Stomach cancer Neg Hx    Esophageal cancer Neg Hx    Rectal cancer Neg Hx     Health Maintenance  Topic Date Due   INFLUENZA VACCINE  02/01/2022   COVID-19 Vaccine (5 - Pfizer risk series) 03/12/2022 (Originally 05/10/2021)   Zoster Vaccines- Shingrix (1 of 2) 05/27/2022  (Originally 11[birth year]/1964)   TETANUS/TDAP  12/12/2031   Pneumonia Vaccine 31+ Years old  Completed   Hepatitis C Screening  Completed   HPV VACCINES  Aged Out   COLONOSCOPY (Pts 45-66yr Insurance coverage will need to be confirmed)  Discontinued   Fecal DNA (Cologuard)  Discontinued     ----------------------------------------------------------------------------------------------------------------------------------------------------------------------------------------------------------------- Physical Exam BP (!) 147/63 (BP Location: Left Arm, Patient Position: Sitting, Cuff Size: Normal)   Pulse 74   Temp 98 F (36.7 C) (Oral)   Ht '5\' 10"'$  (1.778 m)   Wt 174 lb (78.9 kg)   SpO2 95%   BMI 24.97 kg/m   Physical Exam Constitutional:      Appearance: Normal appearance.  Eyes:     General: No scleral icterus. Cardiovascular:     Rate and Rhythm: Normal rate and regular rhythm.     Heart sounds: Murmur (2/6 to 3/6 diastolic murmur.) heard.  Musculoskeletal:     Cervical back: Neck supple.  Neurological:     General: No focal deficit present.     Mental Status: He is alert.  Psychiatric:        Mood and Affect: Mood normal.        Behavior: Behavior normal.   EKG: Normal sinus rhythm.  Relatively unchanged from previous EKG.    ------------------------------------------------------------------------------------------------------------------------------------------------------------------------------------------------------------------- Assessment and Plan  Essential hypertension, benign Blood pressure is mildly elevated.  Recommend monitoring blood pressure at home.  Aortic valve regurgitation He is having increased dyspnea.  Previous echo indicates that he symptoms are worsened some.  We will contact his cardiologist to update him as well.  Prediabetes Updating A1c.  Hyperlipidemia Tolerating simvastatin well.  Continue at current strength.  Repeating lipid panel.   No  orders of the defined types were placed in this encounter.   Return in about 6 months (around 08/27/2022) for HTN.    This visit occurred during the SARS-CoV-2 public health emergency.  Safety protocols were in place, including screening questions prior to the visit, additional usage of staff PPE, and extensive cleaning of exam room while observing appropriate contact time as indicated for disinfecting solutions.

## 2022-02-24 NOTE — Assessment & Plan Note (Signed)
Updating A1c. 

## 2022-02-24 NOTE — Assessment & Plan Note (Signed)
Blood pressure is mildly elevated.  Recommend monitoring blood pressure at home.

## 2022-02-24 NOTE — Patient Instructions (Signed)
Continue current medications.  Have labs completed when fasting.  I will let Dr. Stanford Breed know that you are having some increase shortness of breath.

## 2022-02-28 DIAGNOSIS — R7303 Prediabetes: Secondary | ICD-10-CM | POA: Diagnosis not present

## 2022-02-28 DIAGNOSIS — I10 Essential (primary) hypertension: Secondary | ICD-10-CM | POA: Diagnosis not present

## 2022-02-28 DIAGNOSIS — R972 Elevated prostate specific antigen [PSA]: Secondary | ICD-10-CM | POA: Diagnosis not present

## 2022-02-28 DIAGNOSIS — E782 Mixed hyperlipidemia: Secondary | ICD-10-CM | POA: Diagnosis not present

## 2022-03-01 LAB — CBC WITH DIFFERENTIAL/PLATELET
Absolute Monocytes: 521 cells/uL (ref 200–950)
Basophils Absolute: 53 cells/uL (ref 0–200)
Basophils Relative: 0.8 %
Eosinophils Absolute: 238 cells/uL (ref 15–500)
Eosinophils Relative: 3.6 %
HCT: 46.8 % (ref 38.5–50.0)
Hemoglobin: 15.7 g/dL (ref 13.2–17.1)
Lymphs Abs: 1861 cells/uL (ref 850–3900)
MCH: 32 pg (ref 27.0–33.0)
MCHC: 33.5 g/dL (ref 32.0–36.0)
MCV: 95.3 fL (ref 80.0–100.0)
MPV: 9.9 fL (ref 7.5–12.5)
Monocytes Relative: 7.9 %
Neutro Abs: 3927 cells/uL (ref 1500–7800)
Neutrophils Relative %: 59.5 %
Platelets: 153 10*3/uL (ref 140–400)
RBC: 4.91 10*6/uL (ref 4.20–5.80)
RDW: 12.1 % (ref 11.0–15.0)
Total Lymphocyte: 28.2 %
WBC: 6.6 10*3/uL (ref 3.8–10.8)

## 2022-03-01 LAB — LIPID PANEL W/REFLEX DIRECT LDL
Cholesterol: 146 mg/dL (ref <200)
HDL: 40 mg/dL (ref >=40)
LDL Cholesterol (Calc): 85 mg/dL (calc)
Non-HDL Cholesterol (Calc): 106 mg/dL (calc) (ref <130)
Total CHOL/HDL Ratio: 3.7 (calc) (ref <5.0)
Triglycerides: 109 mg/dL (ref <150)

## 2022-03-01 LAB — BASIC METABOLIC PANEL
BUN/Creatinine Ratio: 27 (calc) — ABNORMAL HIGH (ref 6–22)
BUN: 26 mg/dL — ABNORMAL HIGH (ref 7–25)
CO2: 24 mmol/L (ref 20–32)
Calcium: 9.1 mg/dL (ref 8.6–10.3)
Chloride: 110 mmol/L (ref 98–110)
Creat: 0.96 mg/dL (ref 0.70–1.28)
Glucose, Bld: 105 mg/dL — ABNORMAL HIGH (ref 65–99)
Potassium: 4.4 mmol/L (ref 3.5–5.3)
Sodium: 142 mmol/L (ref 135–146)

## 2022-03-01 LAB — HEPATIC FUNCTION PANEL
AG Ratio: 1.6 (calc) (ref 1.0–2.5)
ALT: 19 U/L (ref 9–46)
AST: 20 U/L (ref 10–35)
Albumin: 3.9 g/dL (ref 3.6–5.1)
Alkaline phosphatase (APISO): 57 U/L (ref 35–144)
Bilirubin, Direct: 0.2 mg/dL (ref 0.0–0.2)
Globulin: 2.5 g/dL (calc) (ref 1.9–3.7)
Indirect Bilirubin: 0.8 mg/dL (calc) (ref 0.2–1.2)
Total Bilirubin: 1 mg/dL (ref 0.2–1.2)
Total Protein: 6.4 g/dL (ref 6.1–8.1)

## 2022-03-01 LAB — HEMOGLOBIN A1C
Hgb A1c MFr Bld: 5.6 % of total Hgb (ref <5.7)
Mean Plasma Glucose: 114 mg/dL
eAG (mmol/L): 6.3 mmol/L

## 2022-03-01 LAB — PSA: PSA: 6.39 ng/mL — ABNORMAL HIGH (ref <=4.00)

## 2022-03-09 NOTE — Addendum Note (Signed)
Addended by: Narda Rutherford on: 03/09/2022 09:48 AM   Modules accepted: Orders

## 2022-03-14 DIAGNOSIS — Z23 Encounter for immunization: Secondary | ICD-10-CM | POA: Diagnosis not present

## 2022-03-29 DIAGNOSIS — Z23 Encounter for immunization: Secondary | ICD-10-CM | POA: Diagnosis not present

## 2022-03-30 ENCOUNTER — Other Ambulatory Visit: Payer: Self-pay | Admitting: *Deleted

## 2022-03-30 DIAGNOSIS — I712 Thoracic aortic aneurysm, without rupture, unspecified: Secondary | ICD-10-CM

## 2022-04-18 ENCOUNTER — Ambulatory Visit (INDEPENDENT_AMBULATORY_CARE_PROVIDER_SITE_OTHER): Payer: Medicare Other

## 2022-04-18 DIAGNOSIS — K449 Diaphragmatic hernia without obstruction or gangrene: Secondary | ICD-10-CM | POA: Diagnosis not present

## 2022-04-18 DIAGNOSIS — I712 Thoracic aortic aneurysm, without rupture, unspecified: Secondary | ICD-10-CM

## 2022-04-18 LAB — I-STAT CREATININE (MANUAL ENTRY): Creatinine, Ser: 1.1 (ref 0.50–1.10)

## 2022-04-18 MED ORDER — IOHEXOL 350 MG/ML SOLN
100.0000 mL | Freq: Once | INTRAVENOUS | Status: AC | PRN
  Administered 2022-04-18: 100 mL via INTRAVENOUS

## 2022-05-31 ENCOUNTER — Other Ambulatory Visit: Payer: Self-pay | Admitting: Family Medicine

## 2022-05-31 DIAGNOSIS — R739 Hyperglycemia, unspecified: Secondary | ICD-10-CM

## 2022-05-31 DIAGNOSIS — I10 Essential (primary) hypertension: Secondary | ICD-10-CM

## 2022-05-31 DIAGNOSIS — E785 Hyperlipidemia, unspecified: Secondary | ICD-10-CM

## 2022-06-07 NOTE — Progress Notes (Addendum)
HPI:FU aortic insufficiency. 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  CTA October 2023 showed mildly dilated ascending aorta at 4 cm.  Since last seen there is no significant dyspnea on exertion, orthopnea, PND, pedal edema, chest pain or syncope.  Current Outpatient Medications  Medication Sig Dispense Refill   aspirin EC 81 MG tablet Take 81 mg by mouth daily.     losartan (COZAAR) 100 MG tablet Take 1 tablet by mouth once daily 90 tablet 0   magnesium oxide (MAG-OX) 400 MG tablet Take 800 mg by mouth daily.     metoprolol succinate (TOPROL-XL) 25 MG 24 hr tablet Take 1 tablet (25 mg total) by mouth daily. 90 tablet 3   simvastatin (ZOCOR) 20 MG tablet Take 1 tablet by mouth once daily 90 tablet 0   No current facility-administered medications for this visit.     Past Medical History:  Diagnosis Date   Aortic valve regurgitation 11/28/2018   Moderate echocardiogram May 2020   Arthritis    Esophageal stricture    GERD (gastroesophageal reflux disease)    Hiatal hernia    Hyperlipidemia    Hypertension     Past Surgical History:  Procedure Laterality Date   APPENDECTOMY  1960   arterial catheter  1997   COLONOSCOPY  07/16/2018   Dr.Perry   POLYPECTOMY     UPPER GASTROINTESTINAL ENDOSCOPY      Social History   Socioeconomic History   Marital status: Significant Other    Spouse name: [name]   Number of children: 2   Years of education: 18   Highest education level: Master's degree (e.g., MA, MS, MEng, MEd, MSW, MBA)  Occupational History   Occupation: [employer]    Comment: retired  Tobacco Use   Smoking status: Former   Smokeless tobacco: Never  Scientific laboratory technician Use: Never used  Substance and Sexual  Activity   Alcohol use: No    Alcohol/week: 0.0 standard drinks of alcohol    Comment: quit 20 years ago   Drug use: No    Comment: quit 1994 (cocaine,pot,"anything could get hands on")   Sexual activity: Yes  Other Topics Concern   Not on file  Social History Narrative    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 Drinks 2 cupsa of caffeine during the day.   Social Determinants of Health   Financial Resource Strain: Low Risk  (07/02/2018)   Overall Financial Resource Strain (CARDIA)    Difficulty of Paying Living Expenses: Not hard at all  Food Insecurity: No Food Insecurity (07/02/2018)   Hunger Vital Sign    Worried About Running Out of Food in the Last Year: Never true    Ran Out of Food in the Last Year: Never true  Transportation Needs: No Transportation Needs (07/02/2018)   PRAPARE - Hydrologist (Medical): No    Lack of Transportation (Non-Medical): No  Physical Activity: Insufficiently Active (07/02/2018)   Exercise Vital Sign    Days of Exercise per Week: 6 days    Minutes of Exercise per Session: 20 min  Stress: No Stress Concern Present (07/02/2018)   [place]  Stress Questionnaire    Feeling of Stress : Not at all  Social Connections: Somewhat Isolated (07/02/2018)   Social Connection and Isolation Panel [NHANES]    Frequency of Communication with Friends and Family: Three times a week    Frequency of Social Gatherings with Friends and Family: Never    Attends Religious Services: Never    Marine scientist or Organizations: No    Attends Archivist Meetings: Never    Marital Status: Living with partner  Intimate Partner Violence: Not At Risk (07/02/2018)   Humiliation, Afraid, Rape, and Kick questionnaire    Fear of Current or Ex-Partner: No    Emotionally Abused: No    Physically Abused: No    Sexually Abused: No     Family History  Problem Relation Age of Onset   Skin cancer Father    Colon polyps Mother        benign   Dementia Mother    Colon cancer Neg Hx    Stomach cancer Neg Hx    Esophageal cancer Neg Hx    Rectal cancer Neg Hx     ROS: no fevers or chills, productive cough, hemoptysis, dysphasia, odynophagia, melena, hematochezia, dysuria, hematuria, rash, seizure activity, orthopnea, PND, pedal edema, claudication. Remaining systems are negative.  Physical Exam: Well-developed well-nourished in no acute distress.  Skin is warm and dry.  HEENT is normal.  Neck is supple.  Chest is clear to auscultation with normal expansion.  Cardiovascular exam is regular rate and rhythm.  2/6 systolic murmur and 1/6 diastolic murmur left sternal border. Abdominal exam nontender or distended. No masses palpated.  Positive bruit Extremities show no edema. neuro grossly intact  A/P  1 aortic insufficiency-moderate to severe on most recent echocardiogram.  Patient remains asymptomatic.  Will plan repeat study in June.  May require aortic valve replacement in the future.  2 history of dilated ascending aorta-we will arrange follow-up CTA October 2024.  3 hypertension-patient's blood pressure is controlled.  Continue present medical regimen.  4 hyperlipidemia-continue statin.  5 history of palpitations-continue beta-blocker.  6 abdominal bruit-schedule ultrasound to exclude aneurysm.  Kirk Ruths, MD

## 2022-06-14 ENCOUNTER — Encounter (HOSPITAL_COMMUNITY): Payer: Self-pay

## 2022-06-14 ENCOUNTER — Other Ambulatory Visit (HOSPITAL_COMMUNITY): Payer: Medicare Other

## 2022-06-15 ENCOUNTER — Ambulatory Visit (INDEPENDENT_AMBULATORY_CARE_PROVIDER_SITE_OTHER): Payer: Medicare Other | Admitting: Cardiology

## 2022-06-15 ENCOUNTER — Encounter: Payer: Self-pay | Admitting: Cardiology

## 2022-06-15 VITALS — BP 128/66 | HR 54 | Ht 70.0 in | Wt 171.0 lb

## 2022-06-15 DIAGNOSIS — I351 Nonrheumatic aortic (valve) insufficiency: Secondary | ICD-10-CM

## 2022-06-15 DIAGNOSIS — R0989 Other specified symptoms and signs involving the circulatory and respiratory systems: Secondary | ICD-10-CM

## 2022-06-15 DIAGNOSIS — I712 Thoracic aortic aneurysm, without rupture, unspecified: Secondary | ICD-10-CM

## 2022-06-15 NOTE — Addendum Note (Signed)
Addended by: Cristopher Estimable on: 06/15/2022 02:51 PM   Modules accepted: Orders

## 2022-06-15 NOTE — Patient Instructions (Signed)
   Testing/Procedures:  Your physician has requested that you have an abdominal aorta duplex. During this test, an ultrasound is used to evaluate the aorta. Allow 30 minutes for this exam. Do not eat after midnight the day before and avoid carbonated beverages [facility] [place]  CTA OF THE CHEST TO FOLLOW UP SIZE OF AORTA IN THE [place] OFFICE   Your physician has requested that you have an echocardiogram. Echocardiography is a painless test that uses sound waves to create images of your heart. It provides your doctor with information about the size and shape of your heart and how well your heart's chambers and valves are working. This procedure takes approximately one hour. There are no restrictions for this procedure. Please do NOT wear cologne, perfume, aftershave, or lotions (deodorant is allowed). Please arrive 15 minutes prior to your appointment time. [place] JUNE 2024   Follow-Up: At [facility], you and your health needs are our priority.  As part of our continuing mission to provide you with exceptional heart care, we have created designated Provider Care Teams.  These Care Teams include your primary Cardiologist (physician) and Advanced Practice Providers (APPs -  Physician Assistants and Nurse Practitioners) who all work together to provide you with the care you need, when you need it.  We recommend signing up for the patient portal called "MyChart".  Sign up information is provided on this After Visit Summary.  MyChart is used to connect with patients for Virtual Visits (Telemedicine).  Patients are able to view lab/test results, encounter notes, upcoming appointments, etc.  Non-urgent messages can be sent to your provider as well.   To learn more about what you can do with MyChart, go to [email or web address].    Your next appointment:   6 month(s)  The format for your next appointment:   In Person  Provider:    Kirk Ruths, MD

## 2022-06-22 ENCOUNTER — Ambulatory Visit (HOSPITAL_COMMUNITY)
Admission: RE | Admit: 2022-06-22 | Discharge: 2022-06-22 | Disposition: A | Discharge status: Home / Self Care | Payer: Medicare Other | Source: Ambulatory Visit | Attending: Cardiovascular Disease | Admitting: Cardiovascular Disease

## 2022-06-22 DIAGNOSIS — R0989 Other specified symptoms and signs involving the circulatory and respiratory systems: Secondary | ICD-10-CM

## 2022-06-24 ENCOUNTER — Encounter: Payer: Self-pay | Admitting: *Deleted

## 2022-08-04 ENCOUNTER — Telehealth: Payer: Self-pay | Admitting: Family Medicine

## 2022-08-04 NOTE — Telephone Encounter (Signed)
Left voicemail for patient to call and schedule annual wellness visit at number on file. -MAJ  

## 2022-08-08 ENCOUNTER — Ambulatory Visit (INDEPENDENT_AMBULATORY_CARE_PROVIDER_SITE_OTHER): Payer: Medicare Other | Admitting: Family Medicine

## 2022-08-08 DIAGNOSIS — Z0000 Encounter for general adult medical examination without abnormal findings: Secondary | ICD-10-CM | POA: Diagnosis not present

## 2022-08-08 NOTE — Patient Instructions (Addendum)
[facility] Maintenance Summary and Written Plan of Care  Mr. Galli ,  Thank you for allowing me to perform your Medicare Annual Wellness Visit and for your ongoing commitment to your health.   Health Maintenance & Immunization History Health Maintenance  Topic Date Due   COVID-19 Vaccine (6 - 2023-24 season) 08/24/2022 (Originally 05/24/2022)   Zoster Vaccines- Shingrix (1 of 2) 11/06/2022 (Originally 11/13/1964)   Medicare Annual Wellness (AWV)  08/09/2023   DTaP/Tdap/Td (3 - Td or Tdap) 12/12/2031   Pneumonia Vaccine 12+ Years old  Completed   INFLUENZA VACCINE  Completed   Hepatitis C Screening  Completed   HPV VACCINES  Aged Out   COLONOSCOPY (Pts 45-3yr Insurance coverage will need to be confirmed)  Discontinued   Fecal DNA (Cologuard)  Discontinued   Immunization History  Administered Date(s) Administered   Fluad Quad(high Dose 65+) 03/06/2019, 03/15/2021   Influenza, High Dose Seasonal PF 04/26/2022   Influenza,inj,Quad PF,6+ Mos 03/13/2018   PFIZER(Purple Top)SARS-COV-2 Vaccination 08/15/2019, 09/09/2019, 10/13/2020   Pfizer Covid-19 Vaccine Bivalent Booster 185yr& up 03/15/2021, 03/29/2022   Pneumococcal Conjugate-13 09/06/2016   Pneumococcal Polysaccharide-23 07/18/2012   RSV,unspecified 04/12/2022   Tdap 02/07/2013, 12/11/2021   Zoster, Live 09/03/2012    These are the patient goals that we discussed:  Goals Addressed               This Visit's Progress     Patient Stated (pt-stated)        Patient stated that that he would like maintain his communication skills and be more mindful.         This is a list of Health Maintenance Items that are overdue or due now: Shingrix vaccine    Orders/Referrals Placed Today: No orders of the defined types were placed in this encounter.  (Contact our referral department at [phone] you have not spoken with someone about your referral appointment within the next 5 days)     Follow-up Plan Follow-up with MaLuetta NuttingDO as planned Schedule shingrix vaccine at the pharmacy. Medicare wellness visit in one year. Patient will access AVS on my chart.      Health Maintenance, Male Adopting a healthy lifestyle and getting preventive care are important in promoting health and wellness. Ask your health care provider about: The right schedule for you to have regular tests and exams. Things you can do on your own to prevent diseases and keep yourself healthy. What should I know about diet, weight, and exercise? Eat a healthy diet  Eat a diet that includes plenty of vegetables, fruits, low-fat dairy products, and lean protein. Do not eat a lot of foods that are high in solid fats, added sugars, or sodium. Maintain a healthy weight Body mass index (BMI) is a measurement that can be used to identify possible weight problems. It estimates body fat based on height and weight. Your health care provider can help determine your BMI and help you achieve or maintain a healthy weight. Get regular exercise Get regular exercise. This is one of the most important things you can do for your health. Most adults should: Exercise for at least 150 minutes each week. The exercise should increase your heart rate and make you sweat (moderate-intensity exercise). Do strengthening exercises at least twice a week. This is in addition to the moderate-intensity exercise. Spend less time sitting. Even light physical activity can be beneficial. Watch cholesterol and blood lipids Have your blood tested for lipids and cholesterol  at 77 years of age, then have this test every 5 years. You may need to have your cholesterol levels checked more often if: Your lipid or cholesterol levels are high. You are older than 77 years of age. You are at high risk for heart disease. What should I know about cancer screening? Many types of cancers can be detected early and may often be prevented.  Depending on your health history and family history, you may need to have cancer screening at various ages. This may include screening for: Colorectal cancer. Prostate cancer. Skin cancer. Lung cancer. What should I know about heart disease, diabetes, and high blood pressure? Blood pressure and heart disease High blood pressure causes heart disease and increases the risk of stroke. This is more likely to develop in people who have high blood pressure readings or are overweight. Talk with your health care provider about your target blood pressure readings. Have your blood pressure checked: Every 3-5 years if you are 73-68 years of age. Every year if you are 15 years old or older. If you are between the ages of 41 and 65 and are a current or former smoker, ask your health care provider if you should have a one-time screening for abdominal aortic aneurysm (AAA). Diabetes Have regular diabetes screenings. This checks your fasting blood sugar level. Have the screening done: Once every three years after age 97 if you are at a normal weight and have a low risk for diabetes. More often and at a younger age if you are overweight or have a high risk for diabetes. What should I know about preventing infection? Hepatitis B If you have a higher risk for hepatitis B, you should be screened for this virus. Talk with your health care provider to find out if you are at risk for hepatitis B infection. Hepatitis C Blood testing is recommended for: Everyone born from [birth year] through 1965. Anyone with known risk factors for hepatitis C. Sexually transmitted infections (STIs) You should be screened each year for STIs, including gonorrhea and chlamydia, if: You are sexually active and are younger than 77 years of age. You are older than 77 years of age and your health care provider tells you that you are at risk for this type of infection. Your sexual activity has changed since you were last screened, and you are  at increased risk for chlamydia or gonorrhea. Ask your health care provider if you are at risk. Ask your health care provider about whether you are at high risk for HIV. Your health care provider may recommend a prescription medicine to help prevent HIV infection. If you choose to take medicine to prevent HIV, you should first get tested for HIV. You should then be tested every 3 months for as long as you are taking the medicine. Follow these instructions at home: Alcohol use Do not drink alcohol if your health care provider tells you not to drink. If you drink alcohol: Limit how much you have to 0-2 drinks a day. Know how much alcohol is in your drink. In the [place] one drink equals one 12 oz bottle of beer (355 mL), one 5 oz glass of wine (148 mL), or one 1 oz glass of hard liquor (44 mL). Lifestyle Do not use any products that contain nicotine or tobacco. These products include cigarettes, chewing tobacco, and vaping devices, such as e-cigarettes. If you need help quitting, ask your health care provider. Do not use street drugs. Do not share needles. Ask your  health care provider for help if you need support or information about quitting drugs. General instructions Schedule regular health, dental, and eye exams. Stay current with your vaccines. Tell your health care provider if: You often feel depressed. You have ever been abused or do not feel safe at home. Summary Adopting a healthy lifestyle and getting preventive care are important in promoting health and wellness. Follow your health care provider's instructions about healthy diet, exercising, and getting tested or screened for diseases. Follow your health care provider's instructions on monitoring your cholesterol and blood pressure. This information is not intended to replace advice given to you by your health care provider. Make sure you discuss any questions you have with your health care provider. Document Revised: 11/09/2020  Document Reviewed: 11/09/2020 Elsevier Patient Education  [place].

## 2022-08-08 NOTE — Progress Notes (Signed)
MEDICARE ANNUAL WELLNESS VISIT  08/08/2022  Telephone Visit Disclaimer This Medicare AWV was conducted by telephone due to national recommendations for restrictions regarding the COVID-19 Pandemic (e.Nicholas. social distancing).  I verified, using two identifiers, that I am speaking with Nicholas Murillo or their authorized healthcare agent. I discussed the limitations, risks, security, and privacy concerns of performing an evaluation and management service by telephone and the potential availability of an in-person appointment in the future. The patient expressed understanding and agreed to proceed.  Location of Patient: Home Location of Provider (nurse):  In the office.  Subjective:    Nicholas Murillo is a 77 y.o. male patient of Nicholas Nutting, DO who had a Medicare Annual Wellness Visit today via telephone. Gentle is Retired and lives with their partner. he has 2 children. he reports that he is socially active and does interact with friends/family regularly. he is moderately physically active and enjoys reading.  Patient Care Team: Nicholas Nutting, DO as PCP - General (Family Medicine)     08/08/2022    9:26 AM 07/08/2019    7:58 AM 07/02/2018    9:10 AM 12/16/2017    2:40 PM 06/22/2015   10:06 PM  Advanced Directives  Does Patient Have a Medical Advance Directive? No No No No No  Would patient like information on creating a medical advance directive? No - Patient declined No - Patient declined No - Patient declined No - Patient declined     Hospital Utilization Over the Past 12 Months: # of hospitalizations or ER visits: 0 # of surgeries: 0  Review of Systems    Patient reports that his overall health is unchanged compared to last year.  History obtained from chart review and the patient  Patient Reported Readings (BP, Pulse, CBG, Weight, etc) none  Pain Assessment Pain : 0-10 Pain Score: 5  Pain Type: Acute pain Pain Location: Toe (Comment which one) (2nd and 3rd digit) Pain  Orientation: Left Pain Descriptors / Indicators: Nagging, Discomfort Pain Onset: 1 to 4 weeks ago Pain Frequency: Intermittent Effect of Pain on Daily Activities: none     Current Medications & Allergies (verified) Allergies as of 08/08/2022   No Known Allergies      Medication List        Accurate as of August 08, 2022  9:43 AM. If you have any questions, ask your nurse or doctor.          aspirin EC 81 MG tablet Take 81 mg by mouth daily.   losartan 100 MG tablet Commonly known as: COZAAR Take 1 tablet by mouth once daily   magnesium oxide 400 MG tablet Commonly known as: MAG-OX Take 800 mg by mouth daily.   metoprolol succinate 25 MG 24 hr tablet Commonly known as: TOPROL-XL Take 1 tablet (25 mg total) by mouth daily.   simvastatin 20 MG tablet Commonly known as: ZOCOR Take 1 tablet by mouth once daily        History (reviewed): Past Medical History:  Diagnosis Date   Anxiety    Aortic valve regurgitation 11/28/2018   Moderate echocardiogram May 2020   Arthritis    Depression years ago   political   Esophageal stricture    GERD (gastroesophageal reflux disease)    Heart murmur    Hiatal hernia    Hyperlipidemia    Hypertension    Past Surgical History:  Procedure Laterality Date   APPENDECTOMY  1960   arterial catheter  [facility]  SURGERY  a year ago?   non cancerous polops   COLONOSCOPY  07/16/2018   Dr.Perry   POLYPECTOMY     UPPER GASTROINTESTINAL ENDOSCOPY     Family History  Problem Relation Age of Onset   Skin cancer Father    Colon polyps Mother        benign   Dementia Mother    Colon cancer Neg Hx    Stomach cancer Neg Hx    Esophageal cancer Neg Hx    Rectal cancer Neg Hx    Social History   Socioeconomic History   Marital status: Significant Other    Spouse name: Nicholas Murillo   Number of children: 2   Years of education: 18   Highest education level: Master's degree (e.Nicholas., MA, MS, MEng, MEd, MSW, MBA)  Occupational  History   Occupation: [employer]    Comment: retired  Tobacco Use   Smoking status: Former   Smokeless tobacco: Never  Scientific laboratory technician Use: Never used  Substance and Sexual Activity   Alcohol use: No    Comment: quit 20 years ago   Drug use: No    Comment: quit 1994 (cocaine,pot,"anything could get hands on")   Sexual activity: Yes  Other Topics Concern   Not on file  Social History Narrative   Lives with his significant other. He has two children. He enjoys reading and stock market.   Social Determinants of Health   Financial Resource Strain: Low Risk  (08/05/2022)   Overall Financial Resource Strain (CARDIA)    Difficulty of Paying Living Expenses: Not hard at all  Food Insecurity: No Food Insecurity (08/05/2022)   Hunger Vital Sign    Worried About Running Out of Food in the Last Year: Never true    Ran Out of Food in the Last Year: Never true  Transportation Needs: No Transportation Needs (08/05/2022)   PRAPARE - Hydrologist (Medical): No    Lack of Transportation (Non-Medical): No  Physical Activity: Sufficiently Active (08/05/2022)   Exercise Vital Sign    Days of Exercise per Week: 6 days    Minutes of Exercise per Session: 30 min  Stress: Stress Concern Present (08/05/2022)   [place]    Feeling of Stress : To some extent  Social Connections: Unknown (08/08/2022)   Social Connection and Isolation Panel [NHANES]    Frequency of Communication with Friends and Family: Once a week    Frequency of Social Gatherings with Friends and Family: More than three times a week    Attends Religious Services: Not on file    Active Member of Clubs or Organizations: No    Attends Archivist Meetings: Never    Marital Status: Living with partner    Activities of Daily Living    08/05/2022   10:55 AM  In your present state of health, do you have any difficulty performing the following  activities:  Hearing? 0  Vision? 0  Difficulty concentrating or making decisions? 0  Walking or climbing stairs? 0  Dressing or bathing? 0  Doing errands, shopping? 0  Preparing Food and eating ? N  Using the Toilet? N  In the past six months, have you accidently leaked urine? Y  Do you have problems with loss of bowel control? N  Managing your Medications? N  Managing your Finances? N  Housekeeping or managing your Housekeeping? N    Patient Education/ Literacy  How often do you need to have someone help you when you read instructions, pamphlets, or other written materials from your doctor or pharmacy?: 1 - Never What is the last grade level you completed in school?: graduate school  Exercise Current Exercise Habits: Home exercise routine, Type of exercise: walking, Time (Minutes): 30, Frequency (Times/Week): 6, Weekly Exercise (Minutes/Week): 180, Intensity: Moderate, Exercise limited by: None identified  Diet Patient reports consuming 2 meals a day and 0 snack(s) a day Patient reports that his primary diet is: Regular Patient reports that she does have regular access to food.   Depression Screen    08/08/2022    9:31 AM 02/24/2022   10:23 AM 08/26/2021   10:07 AM 07/08/2019    7:59 AM 07/02/2018    9:13 AM 03/12/2018    2:53 PM 02/12/2018    3:38 PM  PHQ 2/9 Scores  PHQ - 2 Score '1 4 2 '$ 0 0 2 5  PHQ- 9 Score  '6    3 11     '$ Fall Risk    08/08/2022    9:16 AM 08/05/2022   10:55 AM 02/24/2022   10:23 AM 08/26/2021   10:06 AM 07/08/2019    7:58 AM  Fall Risk   Falls in the past year? 0 0 0 0 0  Number falls in past yr: 0 0 0 0 0  Injury with Fall? 0 0 0 0 0  Risk for fall due to : No Fall Risks  No Fall Risks No Fall Risks   Follow up Education provided;Falls evaluation completed  Falls evaluation completed Falls evaluation completed Falls prevention discussed     Objective:  Nicholas Murillo seemed alert and oriented and he participated appropriately during our telephone  visit.  Blood Pressure Weight BMI  BP Readings from Last 3 Encounters:  06/15/22 128/66  02/24/22 (!) 147/63  01/10/22 121/63   Wt Readings from Last 3 Encounters:  06/15/22 171 lb 0.6 oz (77.6 kg)  02/24/22 174 lb (78.9 kg)  01/10/22 173 lb (78.5 kg)   BMI Readings from Last 1 Encounters:  06/15/22 24.54 kg/m    *Unable to obtain current vital signs, weight, and BMI due to telephone visit type  Hearing/Vision  Nicholas Murillo did not seem to have difficulty with hearing/understanding during the telephone conversation Reports that he has had a formal eye exam by an eye care professional within the past year Reports that he has not had a formal hearing evaluation within the past year *Unable to fully assess hearing and vision during telephone visit type  Cognitive Function:    08/08/2022    9:34 AM 07/08/2019    8:04 AM 07/02/2018    9:21 AM  6CIT Screen  What Year? 0 points 0 points 0 points  What month? 0 points 0 points 0 points  What time? 0 points 0 points 0 points  Count back from 20 0 points 0 points 0 points  Months in reverse 0 points 0 points 0 points  Repeat phrase 0 points 0 points 2 points  Total Score 0 points 0 points 2 points   (Normal:0-7, Significant for Dysfunction: >8)  Normal Cognitive Function Screening: Yes   Immunization & Health Maintenance Record Immunization History  Administered Date(s) Administered   Fluad Quad(high Dose 65+) 03/06/2019, 03/15/2021   Influenza, High Dose Seasonal PF 04/26/2022   Influenza,inj,Quad PF,6+ Mos 03/13/2018   PFIZER(Purple Top)SARS-COV-2 Vaccination 08/15/2019, 09/09/2019, 10/13/2020   Pfizer Covid-19 Vaccine Bivalent Booster 69yr & up 03/15/2021, 03/29/2022  Pneumococcal Conjugate-13 09/06/2016   Pneumococcal Polysaccharide-23 07/18/2012   RSV,unspecified 04/12/2022   Tdap 02/07/2013, 12/11/2021   Zoster, Live 09/03/2012    Health Maintenance  Topic Date Due   COVID-19 Vaccine (6 - 2023-24 season) 08/24/2022  (Originally 05/24/2022)   Zoster Vaccines- Shingrix (1 of 2) 11/06/2022 (Originally 11/13/1964)   Medicare Annual Wellness (AWV)  08/09/2023   DTaP/Tdap/Td (3 - Td or Tdap) 12/12/2031   Pneumonia Vaccine 43+ Years old  Completed   INFLUENZA VACCINE  Completed   Hepatitis C Screening  Completed   HPV VACCINES  Aged Out   COLONOSCOPY (Pts 45-61yr Insurance coverage will need to be confirmed)  Discontinued   Fecal DNA (Cologuard)  Discontinued       Assessment  This is a routine wellness examination for GWalt Murillo  Health Maintenance: Due or Overdue There are no preventive care reminders to display for this patient.   Nicholas Barrydoes not need a referral for Community Assistance: Care Management:   no Social Work:    no Prescription Assistance:  no Nutrition/Diabetes Education:  no   Plan:  Personalized Goals  Goals Addressed               This Visit's Progress     Patient Stated (pt-stated)        Patient stated that that he would like maintain his communication skills and be more mindful.       Personalized Health Maintenance & Screening Recommendations  Shingrix vaccine  Lung Cancer Screening Recommended: no (Low Dose CT Chest recommended if Age 77-80years, 30 pack-year currently smoking OR have quit w/in past 15 years) Hepatitis C Screening recommended: no HIV Screening recommended: no  Advanced Directives: Written information was not prepared per patient's request.  Referrals & Orders No orders of the defined types were placed in this encounter.   Follow-up Plan Follow-up with MLuetta Nutting DO as planned Schedule shingrix vaccine at the pharmacy. Medicare wellness visit in one year. Patient will access AVS on my chart.   I have personally reviewed and noted the following in the patient's chart:   Medical and social history Use of alcohol, tobacco or illicit drugs  Current medications and supplements Functional ability and  status Nutritional status Physical activity Advanced directives List of other physicians Hospitalizations, surgeries, and ER visits in previous 12 months Vitals Screenings to include cognitive, depression, and falls Referrals and appointments  In addition, I have reviewed and discussed with Nicholas Barrycertain preventive protocols, quality metrics, and best practice recommendations. A written personalized care plan for preventive services as well as general preventive health recommendations is available and can be mailed to the patient at his request.      BTinnie Gens RN BSN  08/08/2022

## 2022-08-29 ENCOUNTER — Ambulatory Visit (INDEPENDENT_AMBULATORY_CARE_PROVIDER_SITE_OTHER): Payer: Medicare Other

## 2022-08-29 ENCOUNTER — Encounter: Payer: Self-pay | Admitting: Family Medicine

## 2022-08-29 ENCOUNTER — Ambulatory Visit (INDEPENDENT_AMBULATORY_CARE_PROVIDER_SITE_OTHER): Payer: Medicare Other | Admitting: Family Medicine

## 2022-08-29 VITALS — BP 122/56 | HR 54 | Ht 70.0 in | Wt 172.0 lb

## 2022-08-29 DIAGNOSIS — R0600 Dyspnea, unspecified: Secondary | ICD-10-CM

## 2022-08-29 DIAGNOSIS — F1021 Alcohol dependence, in remission: Secondary | ICD-10-CM | POA: Diagnosis not present

## 2022-08-29 DIAGNOSIS — I10 Essential (primary) hypertension: Secondary | ICD-10-CM

## 2022-08-29 DIAGNOSIS — R209 Unspecified disturbances of skin sensation: Secondary | ICD-10-CM | POA: Diagnosis not present

## 2022-08-29 NOTE — Assessment & Plan Note (Signed)
Blood pressure is  well-controlled at this time.  Recommend continuation of current medications for management of hypertension.

## 2022-08-29 NOTE — Progress Notes (Signed)
Nicholas Murillo - 77 y.o. male MRN [id number]  Date of birth: [birth year]  Subjective Chief Complaint  Patient presents with   Shortness of Breath   extremity numbness    HPI Connolly is a 77 year old male here today complaining of dyspnea.  He has had increasing dyspnea over the past year or 2.  This is really worsened more over the past few months.  He did see his cardiologist in December but did not think to mention this to him.  Dyspnea is worse with exertional activities.  It does improve some with rest.  He has not had any chest pain or tightness.  He denies wheezing or cough.  Reports having numbness and tingling in his toes.  This is fairly persistent.  He does note some color change at times.  He does not have pain associated with this.  He does have extensive history of heavy alcohol use but quit about 25 years ago.  He has noted some difficulty with recall of short-term memory at times as well.  Denies any other neurological symptoms.  ROS:  A comprehensive ROS was completed and negative except as noted per HPI  No Known Allergies  Past Medical History:  Diagnosis Date   Anxiety    Aortic valve regurgitation 11/28/2018   Moderate echocardiogram May 2020   Arthritis    Depression years ago   political   Esophageal stricture    GERD (gastroesophageal reflux disease)    Heart murmur    Hiatal hernia    Hyperlipidemia    Hypertension     Past Surgical History:  Procedure Laterality Date   APPENDECTOMY  1960   arterial catheter  [facility]  a year ago?   non cancerous polops   COLONOSCOPY  07/16/2018   Dr.Perry   POLYPECTOMY     UPPER GASTROINTESTINAL ENDOSCOPY      Social History   Socioeconomic History   Marital status: Significant Other    Spouse name: [name]   Number of children: 2   Years of education: 18   Highest education level: Master's degree (e.g., MA, MS, MEng, MEd, MSW, MBA)  Occupational History   Occupation: [employer]    Comment: retired   Tobacco Use   Smoking status: Former   Smokeless tobacco: Never  Scientific laboratory technician Use: Never used  Substance and Sexual Activity   Alcohol use: No    Comment: quit 20 years ago   Drug use: No    Comment: quit 1994 (cocaine,pot,"anything could get hands on")   Sexual activity: Yes  Other Topics Concern   Not on file  Social History Narrative   Lives with his significant other. He has two children. He enjoys reading and stock market.   Social Determinants of Health   Financial Resource Strain: Low Risk  (08/05/2022)   Overall Financial Resource Strain (CARDIA)    Difficulty of Paying Living Expenses: Not hard at all  Food Insecurity: No Food Insecurity (08/05/2022)   Hunger Vital Sign    Worried About Running Out of Food in the Last Year: Never true    Ran Out of Food in the Last Year: Never true  Transportation Needs: No Transportation Needs (08/05/2022)   PRAPARE - Hydrologist (Medical): No    Lack of Transportation (Non-Medical): No  Physical Activity: Sufficiently Active (08/05/2022)   Exercise Vital Sign    Days of Exercise per Week: 6 days    Minutes of  Exercise per Session: 30 min  Stress: Stress Concern Present (08/05/2022)   [place]    Feeling of Stress : To some extent  Social Connections: Unknown (08/08/2022)   Social Connection and Isolation Panel [NHANES]    Frequency of Communication with Friends and Family: Once a week    Frequency of Social Gatherings with Friends and Family: More than three times a week    Attends Religious Services: Not on Advertising copywriter or Organizations: No    Attends Archivist Meetings: Never    Marital Status: Living with partner    Family History  Problem Relation Age of Onset   Skin cancer Father    Colon polyps Mother        benign   Dementia Mother    Colon cancer Neg Hx    Stomach cancer Neg Hx     Esophageal cancer Neg Hx    Rectal cancer Neg Hx     Health Maintenance  Topic Date Due   Zoster Vaccines- Shingrix (1 of 2) 11/06/2022 (Originally 11/13/1964)   COVID-19 Vaccine (6 - 2023-24 season) 09/14/2023 (Originally 05/24/2022)   Medicare Annual Wellness (AWV)  08/09/2023   DTaP/Tdap/Td (3 - Td or Tdap) 12/12/2031   Pneumonia Vaccine 64+ Years old  Completed   INFLUENZA VACCINE  Completed   Hepatitis C Screening  Completed   HPV VACCINES  Aged Out   COLONOSCOPY (Pts 45-64yr Insurance coverage will need to be confirmed)  Discontinued   Fecal DNA (Cologuard)  Discontinued     ----------------------------------------------------------------------------------------------------------------------------------------------------------------------------------------------------------------- Physical Exam BP (!) 122/56 (BP Location: Right Arm, Patient Position: Sitting, Cuff Size: Normal)   Pulse (!) 54   Ht '5\' 10"'$  (1.778 m)   Wt 172 lb (78 kg)   SpO2 96%   BMI 24.68 kg/m   Physical Exam Constitutional:      Appearance: He is well-developed.  HENT:     Head: Normocephalic and atraumatic.  Eyes:     General: No scleral icterus. Cardiovascular:     Rate and Rhythm: Normal rate and regular rhythm.  Pulmonary:     Effort: Pulmonary effort is normal.     Breath sounds: Normal breath sounds.  Musculoskeletal:     Cervical back: Neck supple.  Neurological:     Mental Status: He is alert.  Psychiatric:        Mood and Affect: Mood normal.        Behavior: Behavior normal.     ------------------------------------------------------------------------------------------------------------------------------------------------------------------------------------------------------------------- Assessment and Plan  Essential hypertension, benign Blood pressure is  well-controlled at this time.  Recommend continuation of current medications for management of  hypertension.  Dyspnea Checking labs today.  Chest x-ray ordered.  Encouraged to follow-up with his cardiologist as well.  Disturbance of skin sensation He has extensive prior history of alcoholism.  He did quit 25 years ago.  Does note some mild cognitive changes well.  Checking thiamine and B12 levels.   No orders of the defined types were placed in this encounter.   No follow-ups on file.    This visit occurred during the SARS-CoV-2 public health emergency.  Safety protocols were in place, including screening questions prior to the visit, additional usage of staff PPE, and extensive cleaning of exam room while observing appropriate contact time as indicated for disinfecting solutions.

## 2022-08-29 NOTE — Assessment & Plan Note (Signed)
Checking labs today.  Chest x-ray ordered.  Encouraged to follow-up with his cardiologist as well.

## 2022-08-29 NOTE — Progress Notes (Signed)
Pt brought his home blood pressure cuff. Calibrated correctly.

## 2022-08-29 NOTE — Assessment & Plan Note (Signed)
He has extensive prior history of alcoholism.  He did quit 25 years ago.  Does note some mild cognitive changes well.  Checking thiamine and B12 levels.

## 2022-08-30 ENCOUNTER — Other Ambulatory Visit: Payer: Self-pay | Admitting: Family Medicine

## 2022-08-30 DIAGNOSIS — E785 Hyperlipidemia, unspecified: Secondary | ICD-10-CM

## 2022-08-30 DIAGNOSIS — I10 Essential (primary) hypertension: Secondary | ICD-10-CM

## 2022-08-30 DIAGNOSIS — R739 Hyperglycemia, unspecified: Secondary | ICD-10-CM

## 2022-09-01 LAB — CBC WITH DIFFERENTIAL/PLATELET
Absolute Monocytes: 568 cells/uL (ref 200–950)
Basophils Absolute: 50 cells/uL (ref 0–200)
Basophils Relative: 0.7 %
Eosinophils Absolute: 156 cells/uL (ref 15–500)
Eosinophils Relative: 2.2 %
HCT: 48.2 % (ref 38.5–50.0)
Hemoglobin: 16.2 g/dL (ref 13.2–17.1)
Lymphs Abs: 2002 cells/uL (ref 850–3900)
MCH: 31.5 pg (ref 27.0–33.0)
MCHC: 33.6 g/dL (ref 32.0–36.0)
MCV: 93.6 fL (ref 80.0–100.0)
MPV: 10.5 fL (ref 7.5–12.5)
Monocytes Relative: 8 %
Neutro Abs: 4324 cells/uL (ref 1500–7800)
Neutrophils Relative %: 60.9 %
Platelets: 150 10*3/uL (ref 140–400)
RBC: 5.15 10*6/uL (ref 4.20–5.80)
RDW: 12 % (ref 11.0–15.0)
Total Lymphocyte: 28.2 %
WBC: 7.1 10*3/uL (ref 3.8–10.8)

## 2022-09-01 LAB — COMPLETE METABOLIC PANEL WITH GFR
AG Ratio: 1.6 (calc) (ref 1.0–2.5)
ALT: 20 U/L (ref 9–46)
AST: 22 U/L (ref 10–35)
Albumin: 4.2 g/dL (ref 3.6–5.1)
Alkaline phosphatase (APISO): 62 U/L (ref 35–144)
BUN/Creatinine Ratio: 35 (calc) — ABNORMAL HIGH (ref 6–22)
BUN: 33 mg/dL — ABNORMAL HIGH (ref 7–25)
CO2: 26 mmol/L (ref 20–32)
Calcium: 9.4 mg/dL (ref 8.6–10.3)
Chloride: 105 mmol/L (ref 98–110)
Creat: 0.95 mg/dL (ref 0.70–1.28)
Globulin: 2.6 g/dL (calc) (ref 1.9–3.7)
Glucose, Bld: 95 mg/dL (ref 65–99)
Potassium: 5.1 mmol/L (ref 3.5–5.3)
Sodium: 140 mmol/L (ref 135–146)
Total Bilirubin: 0.9 mg/dL (ref 0.2–1.2)
Total Protein: 6.8 g/dL (ref 6.1–8.1)
eGFR: 83 mL/min/{1.73_m2} (ref >=60)

## 2022-09-01 LAB — VITAMIN B1: Vitamin B1 (Thiamine): 17 nmol/L (ref 8–30)

## 2022-09-01 LAB — VITAMIN B12: Vitamin B-12: 438 pg/mL (ref 200–1100)

## 2022-10-17 ENCOUNTER — Encounter: Payer: Self-pay | Admitting: *Deleted

## 2022-11-04 DIAGNOSIS — R972 Elevated prostate specific antigen [PSA]: Secondary | ICD-10-CM | POA: Diagnosis not present

## 2022-11-11 DIAGNOSIS — R972 Elevated prostate specific antigen [PSA]: Secondary | ICD-10-CM | POA: Diagnosis not present

## 2022-11-11 DIAGNOSIS — N401 Enlarged prostate with lower urinary tract symptoms: Secondary | ICD-10-CM | POA: Diagnosis not present

## 2022-11-11 DIAGNOSIS — R3915 Urgency of urination: Secondary | ICD-10-CM | POA: Diagnosis not present

## 2022-12-05 ENCOUNTER — Ambulatory Visit (HOSPITAL_COMMUNITY): Payer: Medicare Other | Attending: Cardiology

## 2022-12-05 DIAGNOSIS — I351 Nonrheumatic aortic (valve) insufficiency: Secondary | ICD-10-CM | POA: Diagnosis not present

## 2022-12-05 LAB — ECHOCARDIOGRAM COMPLETE
Area-P 1/2: 3.08 cm2
P 1/2 time: 862 msec
S' Lateral: 3.1 cm

## 2023-01-02 ENCOUNTER — Ambulatory Visit (INDEPENDENT_AMBULATORY_CARE_PROVIDER_SITE_OTHER): Payer: Medicare Other | Admitting: Physician Assistant

## 2023-01-02 ENCOUNTER — Ambulatory Visit (INDEPENDENT_AMBULATORY_CARE_PROVIDER_SITE_OTHER): Payer: Medicare Other

## 2023-01-02 ENCOUNTER — Encounter: Payer: Self-pay | Admitting: Physician Assistant

## 2023-01-02 VITALS — BP 139/50 | HR 33 | Ht 70.0 in | Wt 167.0 lb

## 2023-01-02 DIAGNOSIS — R052 Subacute cough: Secondary | ICD-10-CM

## 2023-01-02 DIAGNOSIS — R059 Cough, unspecified: Secondary | ICD-10-CM | POA: Diagnosis not present

## 2023-01-02 DIAGNOSIS — R001 Bradycardia, unspecified: Secondary | ICD-10-CM | POA: Diagnosis not present

## 2023-01-02 DIAGNOSIS — I700 Atherosclerosis of aorta: Secondary | ICD-10-CM | POA: Diagnosis not present

## 2023-01-02 MED ORDER — PREDNISONE 50 MG PO TABS: ORAL_TABLET | ORAL | 0 refills | Status: DC

## 2023-01-02 NOTE — Progress Notes (Unsigned)
Acute Office Visit  Subjective:     Patient ID: Nicholas Murillo, male    DOB: 11-Nov-1945, 77 y.o.   MRN: [id number]  Chief Complaint  Patient presents with   Cough    HPI Patient is in today for dry cough intermittent for 3-4 weeks. Cough comes in spells and can get really aggressive to the point he feels like he is going to "pass out". No shortness of breath, fever, chills, sinus pressure, URI symptoms. He does have some chest tightness with coughing. Denies any lower extremity swelling. He is able to lay flat at night and cough seems to calm down at night. Not triggered by exertion or being outside. Cough drops help a lot. He has never had albuterol inhaler. No hx of lung disease. He does have AV valve regurgitation. No GERD symptoms.   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      Objective:    BP (!) 139/50 (BP  Location: Right Arm, Patient Position: Sitting, Cuff Size: Normal)   Pulse (!) 33   Ht 5\' 10"  (1.778 m)   Wt 167 lb (75.8 kg)   SpO2 97%   BMI 23.96 kg/m  BP Readings from Last 3 Encounters:  01/02/23 (!) 139/50  08/29/22 (!) 122/56  06/15/22 128/66   Wt Readings from Last 3 Encounters:  01/02/23 167 lb (75.8 kg)  08/29/22 172 lb (78 kg)  06/15/22 171 lb 0.6 oz (77.6 kg)      Physical Exam Constitutional:      Appearance: Normal appearance.  HENT:     Head: Normocephalic.     Right Ear: Tympanic membrane, ear canal and external ear normal. There is no impacted cerumen.     Left Ear: Tympanic membrane, ear canal and external ear normal. There is no impacted cerumen.     Nose: Nose normal.     Mouth/Throat:     Mouth: Mucous membranes are moist.     Pharynx: No posterior oropharyngeal erythema.  Eyes:     Conjunctiva/sclera: Conjunctivae normal.  Cardiovascular:     Rate and Rhythm: Bradycardia present. Rhythm irregular.  Pulmonary:     Effort: Pulmonary effort is normal.     Breath sounds: Normal breath sounds. No wheezing or rhonchi.  Musculoskeletal:     Cervical back: Normal range of motion and neck supple. No tenderness.  Lymphadenopathy:  Cervical: No cervical adenopathy.  Neurological:     General: No focal deficit present.     Mental Status: He is alert and oriented to person, place, and time.  Psychiatric:        Mood and Affect: Mood normal.          Assessment & Plan:  Marland KitchenMarland KitchenAmparo was seen today for cough.  Diagnoses and all orders for this visit:  Subacute cough -     DG Chest 2 View; Future -     predniSONE (DELTASONE) 50 MG tablet; Take one tablet daily for 5 days.  Sinus bradycardia   Unclear etiology of cough, likely viral or post viral inflammation.  No signs of bacterial infection on physical exam No signs of fluid overload or asthma or GERD. CXR stat showed no acute findings.  Start prednisone burst HR was really low initially at  33, EKG showed HR at 55 with otherwise normal EKG. Baseline HR is 50s.  Follow up if not improving or symptoms worsening.   Tandy Gaw, PA-C

## 2023-01-02 NOTE — Progress Notes (Signed)
No acute findings. 2mm right middle lobe lung density. Not concerning but will continue to keep an eye on it. Should not be causing your cough. Sending prednisone to pharmacy.

## 2023-01-09 NOTE — Addendum Note (Signed)
Addended by: Chalmers Cater on: 01/09/2023 10:02 AM   Modules accepted: Orders

## 2023-01-11 ENCOUNTER — Ambulatory Visit (INDEPENDENT_AMBULATORY_CARE_PROVIDER_SITE_OTHER): Payer: Medicare Other | Admitting: Family Medicine

## 2023-01-11 ENCOUNTER — Encounter: Payer: Self-pay | Admitting: Family Medicine

## 2023-01-11 VITALS — BP 133/65 | HR 67 | Ht 70.0 in | Wt 165.0 lb

## 2023-01-11 DIAGNOSIS — R058 Other specified cough: Secondary | ICD-10-CM | POA: Diagnosis not present

## 2023-01-11 MED ORDER — BENZONATATE 200 MG PO CAPS: 200.0000 mg | ORAL_CAPSULE | Freq: Two times a day (BID) | ORAL | 0 refills | Status: DC | PRN

## 2023-01-11 NOTE — Assessment & Plan Note (Signed)
Discussed with him that cough may last up to 8 weeks.  Improved with steroids.  Continued allergy component as well given that this does seem to be worsening when around his pets.  Recommend trial of over-the-counter antihistamine such as Zyrtec.  Recommend adding Tessalon Perles.

## 2023-01-11 NOTE — Progress Notes (Signed)
Nicholas Murillo - 77 y.o. male MRN [id number]  Date of birth: [birth year]  Subjective Chief Complaint  Patient presents with   Cough    HPI Nicholas Murillo is a 77 y.o. male here today for follow-up of cough.  He had illness approximately 2 weeks ago persistent cough.  Seen by GI last week and steroids added.  He does report some improvement of his cough but it has not fully resolved.  He denies shortness of breath, wheezing fever chills.  He does notice that his cough seems to be worse when around his pets indoors.  He has not tried an antihistamine.  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   COLONOSCOPY  07/16/2018   Dr.Perry   POLYPECTOMY     UPPER GASTROINTESTINAL ENDOSCOPY      Social History   Socioeconomic History   Marital status: Significant Other    Spouse name: Larita Fife   Number of children: 2   Years of education: 18   Highest education level: Master's degree (e.g., MA, MS, MEng, MEd, MSW, MBA)  Occupational History   Occupation: [employer]    Comment: retired  Tobacco Use   Smoking status: Former   Smokeless tobacco: Never  Building services engineer Use: Never used  Substance and Sexual Activity   Alcohol use: No    Comment: quit 20 years ago   Drug use: No    Comment: quit 1994 (cocaine,pot,"anything could get hands on")   Sexual activity: Yes  Other Topics Concern   Not on file  Social History Narrative   Lives with his significant other. He has  two children. He enjoys reading and stock market.   Social Determinants of Health   Financial Resource Strain: Low Risk  (08/05/2022)   Overall Financial Resource Strain (CARDIA)    Difficulty of Paying Living Expenses: Not hard at all  Food Insecurity: No Food Insecurity (08/05/2022)   Hunger Vital Sign    Worried About Running Out of Food in the Last Year: Never true    Ran Out of Food in the Last Year: Never true  Transportation Needs: No Transportation Needs (08/05/2022)   PRAPARE - Administrator, Civil Service (Medical): No    Lack of Transportation (Non-Medical): No  Physical Activity: Sufficiently Active (08/05/2022)   Exercise Vital Sign    Days of Exercise per Week: 6 days    Minutes of Exercise per Session: 30 min  Stress: Stress Concern Present (08/05/2022)   [employer] of Occupational Health - Occupational Stress Questionnaire    Feeling of Stress : To some extent  Social Connections: Unknown (08/08/2022)   Social Connection and Isolation Panel [NHANES]    Frequency of Communication with Friends and Family: Once a week    Frequency of Social Gatherings with Friends and Family: More than three times a week  Attends Religious Services: Not on file    Active Member of Clubs or Organizations: No    Attends Banker Meetings: Never    Marital Status: Living with partner    Family History  Problem Relation Age of Onset   Skin cancer Father    Colon polyps Mother        benign   Dementia Mother    Colon cancer Neg Hx    Stomach cancer Neg Hx    Esophageal cancer Neg Hx    Rectal cancer Neg Hx     Health Maintenance  Topic Date Due   Zoster Vaccines- Shingrix (1 of 2) 04/04/2023 (Originally 11/13/1964)   COVID-19 Vaccine (6 - 2023-24 season) 09/14/2023 (Originally 05/24/2022)   INFLUENZA VACCINE  02/02/2023   Medicare Annual Wellness (AWV)  08/09/2023   DTaP/Tdap/Td (3 - Td or Tdap) 12/12/2031   Pneumonia Vaccine 21+ Years old  Completed    Hepatitis C Screening  Completed   HPV VACCINES  Aged Out   Colonoscopy  Discontinued   Fecal DNA (Cologuard)  Discontinued     ----------------------------------------------------------------------------------------------------------------------------------------------------------------------------------------------------------------- Physical Exam BP 133/65   Pulse 67   Ht 5\' 10"  (1.778 m)   Wt 165 lb (74.8 kg)   SpO2 98%   BMI 23.68 kg/m   Physical Exam Constitutional:      Appearance: Normal appearance.  HENT:     Head: Normocephalic and atraumatic.  Eyes:     General: No scleral icterus. Cardiovascular:     Rate and Rhythm: Normal rate and regular rhythm.  Pulmonary:     Effort: Pulmonary effort is normal.     Breath sounds: Normal breath sounds.  Neurological:     Mental Status: He is alert.  Psychiatric:        Mood and Affect: Mood normal.        Behavior: Behavior normal.     ------------------------------------------------------------------------------------------------------------------------------------------------------------------------------------------------------------------- Assessment and Plan  Post-viral cough syndrome Discussed with him that cough may last up to 8 weeks.  Improved with steroids.  Continued allergy component as well given that this does seem to be worsening when around his pets.  Recommend trial of over-the-counter antihistamine such as Zyrtec.  Recommend adding Tessalon Perles.   Meds ordered this encounter  Medications   benzonatate (TESSALON) 200 MG capsule    Sig: Take 1 capsule (200 mg total) by mouth 2 (two) times daily as needed for cough.    Dispense:  20 capsule    Refill:  0    No follow-ups on file.    This visit occurred during the SARS-CoV-2 public health emergency.  Safety protocols were in place, including screening questions prior to the visit, additional usage of staff PPE, and extensive cleaning of exam  room while observing appropriate contact time as indicated for disinfecting solutions.

## 2023-01-11 NOTE — Patient Instructions (Addendum)
Try adding cetirizine daily.   You may use tessalon perles as needed for cough.

## 2023-02-22 DIAGNOSIS — L821 Other seborrheic keratosis: Secondary | ICD-10-CM | POA: Diagnosis not present

## 2023-02-22 DIAGNOSIS — L570 Actinic keratosis: Secondary | ICD-10-CM | POA: Diagnosis not present

## 2023-03-08 ENCOUNTER — Ambulatory Visit (INDEPENDENT_AMBULATORY_CARE_PROVIDER_SITE_OTHER): Payer: Medicare Other | Admitting: Cardiology

## 2023-03-08 ENCOUNTER — Encounter: Payer: Self-pay | Admitting: Cardiology

## 2023-03-08 VITALS — BP 102/60 | HR 49 | Ht 70.0 in | Wt 166.8 lb

## 2023-03-08 DIAGNOSIS — I712 Thoracic aortic aneurysm, without rupture, unspecified: Secondary | ICD-10-CM | POA: Diagnosis not present

## 2023-03-08 DIAGNOSIS — R002 Palpitations: Secondary | ICD-10-CM | POA: Diagnosis not present

## 2023-03-08 DIAGNOSIS — I10 Essential (primary) hypertension: Secondary | ICD-10-CM

## 2023-03-08 DIAGNOSIS — I351 Nonrheumatic aortic (valve) insufficiency: Secondary | ICD-10-CM | POA: Diagnosis not present

## 2023-03-08 NOTE — Patient Instructions (Addendum)
Medication Instructions:   Your physician recommends that you continue on your current medications as directed. Please refer to the Current Medication list given to you today.   *If you need a refill on your cardiac medications before your next appointment, please call your pharmacy*   Lab Work:  Lab 30 days prior.   If you have labs (blood work) drawn today and your tests are completely normal, you will receive your results only by: MyChart Message (if you have MyChart) OR A paper copy in the mail If you have any lab test that is abnormal or we need to change your treatment, we will call you to review the results.   Testing/Procedures:   Non-Cardiac CT Angiography (CTA), is a special type of CT scan that uses a computer to produce multi-dimensional views of major blood vessels throughout the body. In CT angiography, a contrast material is injected through an IV to help visualize the blood vessels    Follow-Up: At [facility], you and your health needs are our priority.  As part of our continuing mission to provide you with exceptional heart care, we have created designated Provider Care Teams.  These Care Teams include your primary Cardiologist (physician) and Advanced Practice Providers (APPs -  Physician Assistants and Nurse Practitioners) who all work together to provide you with the care you need, when you need it.  We recommend signing up for the patient portal called "MyChart".  Sign up information is provided on this After Visit Summary.  MyChart is used to connect with patients for Virtual Visits (Telemedicine).  Patients are able to view lab/test results, encounter notes, upcoming appointments, etc.  Non-urgent messages can be sent to your provider as well.   To learn more about what you can do with MyChart, go to [email or web address].    Your next appointment:   4 month(s)  Provider:   Ripley Fraise

## 2023-03-08 NOTE — Progress Notes (Signed)
HPI: FU aortic insufficiency. CTA October 2023 showed mildly dilated ascending aorta at 4 cm.  Abdominal ultrasound December 2023 showed no abdominal aortic aneurysm but there was note of abnormal dilatation of the right proximal common iliac vein.  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  Since last seen patient notices occasional dyspnea with bending over.  However he can work in his yard without having significant dyspnea.  He denies orthopnea, PND, pedal edema, chest pain or syncope.  Current Outpatient Medications  Medication Sig Dispense Refill   aspirin EC 81 MG tablet Take 81 mg by mouth daily.     benzonatate (TESSALON) 200 MG capsule Take 1 capsule (200 mg total) by mouth 2 (two) times daily as needed for cough. 20 capsule 0   losartan (COZAAR) 100 MG tablet Take 1 tablet by mouth once daily 90 tablet 3   magnesium oxide (MAG-OX) 400 MG tablet Take 800 mg by mouth daily.     metoprolol succinate (TOPROL-XL) 25 MG 24 hr tablet Take 1 tablet by mouth once daily 90 tablet 3   predniSONE (DELTASONE) 50 MG tablet Take one tablet daily for 5 days. 5 tablet 0   simvastatin (ZOCOR) 20 MG tablet Take 1 tablet by mouth once daily 90 tablet 3   No current facility-administered medications for this visit.     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    COLONOSCOPY  07/16/2018   Dr.Perry   POLYPECTOMY     UPPER GASTROINTESTINAL ENDOSCOPY      Social History   Socioeconomic History   Marital status: Significant Other    Spouse name: Larita Fife   Number of children: 2   Years of education: 18   Highest education level: Master's degree (e.g., MA, MS, MEng, MEd, MSW, MBA)  Occupational History   Occupation: [employer]    Comment: retired  Tobacco Use   Smoking status: Former   Smokeless tobacco: Never  Advertising account planner   Vaping status: Never Used  Substance and Sexual Activity   Alcohol use: No    Comment: quit 20 years ago   Drug use: No    Comment: quit 1994 (cocaine,pot,"anything could get hands on")   Sexual activity: Yes  Other Topics Concern   Not on file  Social History Narrative   Lives with his significant other. He has two children. He enjoys reading and stock market.   Social Determinants of Health   Financial Resource Strain: Low Risk  (08/05/2022)   Overall Financial Resource Strain (CARDIA)    Difficulty of Paying Living Expenses: Not hard at all  Food Insecurity: No Food Insecurity (08/05/2022)   Hunger Vital Sign    Worried About Running Out of Food in the Last Year: Never true    Ran Out of Food in the Last  Year: Never true  Transportation Needs: No Transportation Needs (08/05/2022)   PRAPARE - Administrator, Civil Service (Medical): No    Lack of Transportation (Non-Medical): No  Physical Activity: Sufficiently Active (08/05/2022)   Exercise Vital Sign    Days of Exercise per Week: 6 days    Minutes of Exercise per Session: 30 min  Stress: Stress Concern Present (08/05/2022)   [employer] of Occupational Health - Occupational Stress Questionnaire    Feeling of Stress : To some extent  Social Connections: Unknown (08/08/2022)   Social Connection and Isolation Panel [NHANES]    Frequency of Communication with Friends and Family: Once a week    Frequency of Social Gatherings with Friends and Family: More than  three times a week    Attends Religious Services: Not on Marketing executive or Organizations: No    Attends Banker Meetings: Never    Marital Status: Living with partner  Intimate Partner Violence: Not At Risk (08/08/2022)   Humiliation, Afraid, Rape, and Kick questionnaire    Fear of Current or Ex-Partner: No    Emotionally Abused: No    Physically Abused: No    Sexually Abused: No    Family History  Problem Relation Age of Onset   Skin cancer Father    Colon polyps Mother        benign   Dementia Mother    Colon cancer Neg Hx    Stomach cancer Neg Hx    Esophageal cancer Neg Hx    Rectal cancer Neg Hx     ROS: no fevers or chills, productive cough, hemoptysis, dysphasia, odynophagia, melena, hematochezia, dysuria, hematuria, rash, seizure activity, orthopnea, PND, pedal edema, claudication. Remaining systems are negative.  Physical Exam: Well-developed well-nourished in no acute distress.  Skin is warm and dry.  HEENT is normal.  Neck is supple.  Chest is clear to auscultation with normal expansion.  Cardiovascular exam is regular rate and rhythm.  Abdominal exam nontender or distended. No masses palpated. Extremities show no edema. neuro grossly intact  ECG-January 02, 2023-sinus rhythm with no ST changes.  Personally reviewed  A/P  1 aortic insufficiency-most recent echocardiogram showed moderate to severe aortic insufficiency.  He has occasional dyspnea with bending over.  However he can work in his yard vigorously without having dyspnea.  There is no orthopnea or other CHF signs.  Will likely repeat his echocardiogram when he returns in January.  He understands to contact me if he has worsening symptoms.  He also understands he may require aortic valve replacement in the future.  Note his most recent echocardiogram in June showed moderate to severe AI but LV function was preserved.  2 dilated ascending aorta-plan follow-up CTA October 2024.  3  hypertension-blood pressure controlled.  Continue present medications.  4 palpitations-continue beta-blocker.  5 hyperlipidemia-continue statin.  Olga Millers, MD

## 2023-03-28 DIAGNOSIS — I10 Essential (primary) hypertension: Secondary | ICD-10-CM | POA: Diagnosis not present

## 2023-03-28 DIAGNOSIS — R002 Palpitations: Secondary | ICD-10-CM | POA: Diagnosis not present

## 2023-03-28 DIAGNOSIS — I712 Thoracic aortic aneurysm, without rupture, unspecified: Secondary | ICD-10-CM | POA: Diagnosis not present

## 2023-03-28 DIAGNOSIS — I351 Nonrheumatic aortic (valve) insufficiency: Secondary | ICD-10-CM | POA: Diagnosis not present

## 2023-03-29 LAB — BASIC METABOLIC PANEL
BUN/Creatinine Ratio: 28 — ABNORMAL HIGH (ref 10–24)
BUN: 27 mg/dL (ref 8–27)
CO2: 22 mmol/L (ref 20–29)
Calcium: 9.5 mg/dL (ref 8.6–10.2)
Chloride: 104 mmol/L (ref 96–106)
Creatinine, Ser: 0.98 mg/dL (ref 0.76–1.27)
Glucose: 99 mg/dL (ref 70–99)
Potassium: 4.6 mmol/L (ref 3.5–5.2)
Sodium: 142 mmol/L (ref 134–144)
eGFR: 79 mL/min/{1.73_m2} (ref >59)

## 2023-03-31 ENCOUNTER — Encounter: Payer: Self-pay | Admitting: *Deleted

## 2023-04-05 ENCOUNTER — Encounter: Payer: Self-pay | Admitting: Cardiology

## 2023-04-05 DIAGNOSIS — R002 Palpitations: Secondary | ICD-10-CM

## 2023-04-05 DIAGNOSIS — R0602 Shortness of breath: Secondary | ICD-10-CM

## 2023-04-05 NOTE — Telephone Encounter (Signed)
I think this was supposed to go to Dr. Jens Som. Tereso Newcomer, PA-C    04/05/2023 5:45 PM

## 2023-04-06 ENCOUNTER — Ambulatory Visit: Payer: Medicare Other | Attending: Cardiology

## 2023-04-06 DIAGNOSIS — R002 Palpitations: Secondary | ICD-10-CM | POA: Diagnosis not present

## 2023-04-06 DIAGNOSIS — R0602 Shortness of breath: Secondary | ICD-10-CM | POA: Diagnosis not present

## 2023-04-06 NOTE — Progress Notes (Unsigned)
Enrolled patient for a 3 day Zio XT monitor to be mailed to patients home  

## 2023-04-06 NOTE — Telephone Encounter (Signed)
Nicholas Bunting, MD  Freddi Starr, RN14 hours ago (6:34 PM)    Schedule 3 day zio patch and then fu with APP Olga Millers

## 2023-04-07 DIAGNOSIS — Z23 Encounter for immunization: Secondary | ICD-10-CM | POA: Diagnosis not present

## 2023-04-18 DIAGNOSIS — R0602 Shortness of breath: Secondary | ICD-10-CM | POA: Diagnosis not present

## 2023-04-18 DIAGNOSIS — R002 Palpitations: Secondary | ICD-10-CM | POA: Diagnosis not present

## 2023-04-24 ENCOUNTER — Ambulatory Visit: Payer: Medicare Other

## 2023-04-24 ENCOUNTER — Ambulatory Visit: Payer: Medicare Other | Attending: Nurse Practitioner | Admitting: Nurse Practitioner

## 2023-04-24 ENCOUNTER — Encounter: Payer: Self-pay | Admitting: Nurse Practitioner

## 2023-04-24 VITALS — BP 124/58 | HR 65 | Ht 70.0 in | Wt 164.0 lb

## 2023-04-24 DIAGNOSIS — E782 Mixed hyperlipidemia: Secondary | ICD-10-CM | POA: Diagnosis not present

## 2023-04-24 DIAGNOSIS — I351 Nonrheumatic aortic (valve) insufficiency: Secondary | ICD-10-CM | POA: Diagnosis not present

## 2023-04-24 DIAGNOSIS — I10 Essential (primary) hypertension: Secondary | ICD-10-CM | POA: Diagnosis not present

## 2023-04-24 DIAGNOSIS — R002 Palpitations: Secondary | ICD-10-CM | POA: Insufficient documentation

## 2023-04-24 DIAGNOSIS — I517 Cardiomegaly: Secondary | ICD-10-CM | POA: Diagnosis not present

## 2023-04-24 DIAGNOSIS — I7121 Aneurysm of the ascending aorta, without rupture: Secondary | ICD-10-CM | POA: Diagnosis not present

## 2023-04-24 DIAGNOSIS — I712 Thoracic aortic aneurysm, without rupture, unspecified: Secondary | ICD-10-CM | POA: Insufficient documentation

## 2023-04-24 DIAGNOSIS — I471 Supraventricular tachycardia, unspecified: Secondary | ICD-10-CM | POA: Diagnosis not present

## 2023-04-24 DIAGNOSIS — I700 Atherosclerosis of aorta: Secondary | ICD-10-CM | POA: Diagnosis not present

## 2023-04-24 MED ORDER — IOHEXOL 350 MG/ML SOLN
100.0000 mL | Freq: Once | INTRAVENOUS | Status: AC | PRN
  Administered 2023-04-24: 100 mL via INTRAVENOUS

## 2023-04-24 NOTE — Patient Instructions (Signed)
Medication Instructions:  Your physician recommends that you continue on your current medications as directed. Please refer to the Current Medication list given to you today.  *If you need a refill on your cardiac medications before your next appointment, please call your pharmacy*   Lab Work: NONE ordered at this time of appointment     Testing/Procedures: NONE ordered at this time of appointment     Follow-Up: At [facility], you and your health needs are our priority.  As part of our continuing mission to provide you with exceptional heart care, we have created designated Provider Care Teams.  These Care Teams include your primary Cardiologist (physician) and Advanced Practice Providers (APPs -  Physician Assistants and Nurse Practitioners) who all work together to provide you with the care you need, when you need it.  We recommend signing up for the patient portal called "MyChart".  Sign up information is provided on this After Visit Summary.  MyChart is used to connect with patients for Virtual Visits (Telemedicine).  Patients are able to view lab/test results, encounter notes, upcoming appointments, etc.  Non-urgent messages can be sent to your provider as well.   To learn more about what you can do with MyChart, go to [email or web address].    Your next appointment:    Keep follow up   Provider:   Olga Millers, MD

## 2023-04-24 NOTE — Progress Notes (Unsigned)
Office Visit    Patient Name: Nicholas Murillo Date of Encounter: 04/24/2023  Primary Care Provider:  Everrett Coombe, DO Primary Cardiologist:  Olga Millers, MD  Chief Complaint    77 year old male with a history of valvular heart disease (aortic insufficiency, mitral valve regurgitation, tricuspid valve regurgitation), mild dilation of ascending aorta, potation's with PSVT, PACs, PVCs, hypertension, and hyperlipidemia who presents for follow-up related to valvular heart disease.  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   COLONOSCOPY  07/16/2018   Dr.Perry   POLYPECTOMY     UPPER GASTROINTESTINAL ENDOSCOPY      Allergies  No Known Allergies   Labs/Other Studies Reviewed    The following studies were reviewed today:  Cardiac Studies & Procedures       ECHOCARDIOGRAM  ECHOCARDIOGRAM COMPLETE 12/05/2022  Narrative ECHOCARDIOGRAM REPORT    Patient Name:   Nicholas Murillo Date of Exam: 12/05/2022 Medical Rec #:  [id number]      Height:       70.0 in Accession #:    [id number]     Weight:       172.0 lb Date of Birth:  [birth year]      BSA:          1.958 m Patient Age:    77 years       BP:           122/56 mmHg Patient Gender: M              HR:           54 bpm. Exam Location:  [address]  Procedure: 2D Echo, 3D Echo, Cardiac Doppler, Color Doppler and Strain Analysis  Indications:    I35.9* Nonrheumatic aortic valve disease, unspecified  History:        Patient has prior history of Echocardiogram examinations,  most recent 12/02/2021. Arrythmias:PAC, Signs/Symptoms:Dyspnea; Risk Factors:Hypertension, Dyslipidemia and Former Smoker. Dilated ascending aorta. Palpitations. Prediabetes.  Sonographer:    Cathie Beams RCS Referring Phys: [phone] BRIAN S CRENSHAW  IMPRESSIONS   1. Left ventricular ejection fraction, by estimation, is 50 to 55%. Left ventricular ejection fraction by 3D volume is 54 %. The left ventricle has low normal function. The left ventricle has no regional wall motion abnormalities. There is mild concentric left ventricular hypertrophy. Left ventricular diastolic parameters are indeterminate. 2. Right ventricular systolic function is normal. The right ventricular size is normal. 3. The mitral valve is normal in structure. Mild mitral valve regurgitation. No evidence of mitral stenosis. 4. Tricuspid valve regurgitation is mild to moderate. 5. The aortic valve is tricuspid. There is mild thickening of the aortic valve. Aortic valve regurgitation is moderate to severe. Aortic valve sclerosis is present, with no evidence of aortic valve stenosis. 6. There is mild dilatation of the ascending aorta, measuring 41 mm. 7. The inferior vena cava is normal in size with greater than 50% respiratory variability, suggesting right atrial pressure of  3 mmHg.  FINDINGS Left Ventricle: Left ventricular ejection fraction, by estimation, is 50 to 55%. Left ventricular ejection fraction by 3D volume is 54 %. The left ventricle has low normal function. The left ventricle has no regional wall motion abnormalities. Global longitudinal strain performed but not reported based on interpreter judgement due to suboptimal tracking. The left ventricular internal cavity size was normal in size. There is mild concentric left ventricular hypertrophy. Left ventricular diastolic parameters are indeterminate.  Right Ventricle: The right ventricular size is normal. No increase in right ventricular wall thickness. Right  ventricular systolic function is normal.  Left Atrium: Left atrial size was normal in size.  Right Atrium: Right atrial size was normal in size.  Pericardium: There is no evidence of pericardial effusion.  Mitral Valve: The mitral valve is normal in structure. Mild mitral valve regurgitation. No evidence of mitral valve stenosis.  Tricuspid Valve: The tricuspid valve is normal in structure. Tricuspid valve regurgitation is mild to moderate. No evidence of tricuspid stenosis.  Aortic Valve: The aortic valve is tricuspid. There is mild thickening of the aortic valve. Aortic valve regurgitation is moderate to severe. Aortic regurgitation PHT measures 862 msec. Aortic valve sclerosis is present, with no evidence of aortic valve stenosis.  Pulmonic Valve: The pulmonic valve was normal in structure. Pulmonic valve regurgitation is trivial. No evidence of pulmonic stenosis.  Aorta: There is mild dilatation of the ascending aorta, measuring 41 mm.  Venous: The inferior vena cava is normal in size with greater than 50% respiratory variability, suggesting right atrial pressure of 3 mmHg.  IAS/Shunts: No atrial level shunt detected by color flow Doppler.   LEFT VENTRICLE PLAX 2D LVIDd:         4.60 cm         Diastology LVIDs:         3.10 cm         LV e' medial:    8.49 cm/s LV PW:         1.10 cm         LV E/e' medial:  8.0 LV IVS:        1.10 cm         LV e' lateral:   9.68 cm/s LVOT diam:     2.00 cm         LV E/e' lateral: 7.0 LV SV:         79 LV SV Index:   41 LVOT Area:     3.14 cm        3D Volume EF LV 3D EF:    Left ventricul ar ejection fraction by 3D volume is 54 %.  3D Volume EF: 3D EF:        54 % LV EDV:       174 ml LV ESV:       80 ml LV SV:        94 ml  RIGHT VENTRICLE RV Basal diam:  3.70 cm RV Mid diam:    3.00 cm RV S prime:     12.20 cm/s TAPSE (M-mode): 2.8 cm RVSP:           23.4 mmHg  LEFT ATRIUM             Index        RIGHT ATRIUM            Index LA diam:        3.60 cm 1.84 cm/m   RA  Pressure: 3.00 mmHg LA Vol (A2C):   66.6 ml 34.02 ml/m  RA Area:     16.50 cm LA Vol (A4C):   33.6 ml 17.16 ml/m  RA Volume:   38.90 ml  19.87 ml/m LA Biplane Vol: 50.7 ml 25.90 ml/m AORTIC VALVE LVOT Vmax:   104.00 cm/s LVOT Vmean:  66.200 cm/s LVOT VTI:    0.253 m AI PHT:      862 msec  AORTA Ao Root diam: 3.60 cm Ao Asc diam:  4.10 cm  MITRAL VALVE               TRICUSPID VALVE MV Area (PHT): 3.08 cm    TR Peak grad:   20.4 mmHg MV Decel Time: 246 msec    TR Vmax:        226.00 cm/s MV E velocity: 67.60 cm/s  Estimated RAP:  3.00 mmHg MV A velocity: 57.60 cm/s  RVSP:           23.4 mmHg MV E/A ratio:  1.17 SHUNTS Systemic VTI:  0.25 m Systemic Diam: 2.00 cm  Kardie Tobb DO Electronically signed by Thomasene Ripple DO Signature Date/Time: 12/05/2022/12:25:55 PM    Final    MONITORS  LONG TERM MONITOR (3-14 DAYS) 04/18/2023  Narrative Patch Wear Time:  3 days and 0 hours (2024-10-06T13:54:39-0400 to 2024-10-09T13:56:04-399)  Patient had a min HR of 46 bpm, max HR of 121 bpm, and avg HR of 65 bpm. Predominant underlying rhythm was Sinus Rhythm. First Degree AV Block was present. 22 Supraventricular Tachycardia runs occurred, the run with the fastest interval lasting 15.5 secs with a max rate of 121 bpm, the longest lasting 26.9 secs with an avg rate of 109 bpm. Isolated SVEs were frequent (9.5%, 16109), SVE Couplets were rare (<1.0%, 624), and SVE Triplets were rare (<1.0%, 205). Isolated VEs were occasional (2.9%, 8205), VE Couplets were rare (<1.0%, 5), and VE Triplets were rare (<1.0%, 1). Ventricular Bigeminy and Trigeminy were present.  Sinus bradycardia, NSR, sinus tachycardia, pacs, short runs of SVT, pvcs, rare couplet and triplet Olga Millers          Recent Labs: 08/29/2022: ALT 20; Hemoglobin 16.2; Platelets 150 03/28/2023: BUN 27; Creatinine, Ser 0.98; Potassium 4.6; Sodium 142  Recent Lipid Panel     Component Value Date/Time   CHOL 146 02/28/2022 0740   TRIG 109 02/28/2022 0740   HDL 40 02/28/2022 0740   CHOLHDL 3.7 02/28/2022 0740   VLDL 22 10/13/2015 1613   LDLCALC 85 02/28/2022 0740    History of Present Illness    77 year old male with the above past medical history including valvular heart disease (aortic insufficiency, mitral valve regurgitation, tricuspid valve regurgitation), mild dilation of ascending aorta, potation's with PSVT, PACs, PVCs, hypertension, and hyperlipidemia.  CTA in October 2023 showed mildly dilated ascending aorta measuring 4 cm.  Abdominal ultrasound in December 2023 showed no abdominal aortic aneurysm but there was note of abnormal dilation of the right proximal common iliac vein.  Most recent echocardiogram in 12/2022 showed EF 50 to 55%, normal LV function, no RWMA, mild concentric LVH, normal RV, mild mitral valve regurgitation, mild to moderate tricuspid valve regurgitation, moderate to severe aortic valve regurgitation, no evidence of aortic valve stenosis, mild dilation of the ascending aorta measuring 41 mm, essentially unchanged from prior study, repeat echocardiogram was recommended in 1 year.   He was last seen in office on 03/08/2023 was stable from a cardiac standpoint.  He noted occasional dyspnea with bending  over. He denied any other exertional symptoms. 3-day Zio in 04/2023 in the setting of palpitations, shortness of breath revealed short runs of SVT, PACs and PVCs.  He presents today for follow-up.  Since his last visit he has been stable from a cardiac standpoint.  He has noticed decreased frequency of palpitations since he wore his heart monitor.  He will note occasional episodes which he describes as a "out of the."  He will feel a slight pressure in his chest.  He denies any exertional symptoms of chest pain or dyspnea.  Overall, symptoms are stable.  Reviewed vagal maneuvers, ED precautions.  Advised he may take an additional half tablet to 1  tablet of metoprolol as needed for sustained palpitations.  Proceed with CT chest/aorta as scheduled.  Will defer repeat echo for now given stable symptoms.  Follow-up as scheduled Dr. Jens Som in 07/2022.  Valvular heart disease: Most recent echocardiogram in 12/2022 showed EF 50 to 55%, normal LV function, no RWMA, mild concentric LVH, normal RV, mild mitral valve regurgitation, mild to moderate tricuspid valve regurgitation, moderate to severe aortic valve regurgitation, no evidence of aortic valve stenosis, mild dilation of the ascending aorta measuring 41 mm, essentially unchanged from prior study. Recent dyspnea with bending over, no other exertional symptoms or other signs of heart failure.  Likely repeat echocardiogram in January 2024.  He will notify our office if he has worsening symptoms.  He understands that he may require aortic valve replacement in the future.  Dilation of ascending aorta: Follow-up CTA scheduled for 04/27/2023. Palpitations:  3-day Zio in 04/2023 in the setting of palpitations, shortness of breath revealed short runs of SVT, PACs and PVCs.   Hypertension: BP well controlled. Continue current antihypertensive regimen.  Hyperlipidemia: LDL was 85 in 02/2022. Continue Lipitor. Monitored per PCP.  Disposition: Follow-up as scheduled with Dr. Jens Som in 07/2023.   Home Medications    Current Outpatient Medications  Medication Sig Dispense Refill   aspirin EC 81 MG tablet Take 81 mg by mouth daily.     losartan (COZAAR) 100 MG tablet Take 1 tablet by mouth once daily 90 tablet 3   magnesium oxide (MAG-OX) 400 MG tablet Take 800 mg by mouth daily.     metoprolol succinate (TOPROL-XL) 25 MG 24 hr tablet Take 1 tablet by mouth once daily 90 tablet 3   simvastatin (ZOCOR) 20 MG tablet Take 1 tablet by mouth once daily 90 tablet 3   benzonatate (TESSALON) 200 MG capsule Take 1 capsule (200 mg total) by mouth 2 (two) times daily as needed for cough. 20 capsule 0   predniSONE  (DELTASONE) 50 MG tablet Take one tablet daily for 5 days. 5 tablet 0   No current facility-administered medications for this visit.     Review of Systems    ***.  All other systems reviewed and are otherwise negative except as noted above.    Physical Exam    VS:  BP (!) 124/58 (BP Location: Right Arm, Patient Position: Sitting, Cuff Size: Normal)   Pulse 65   Ht 5\' 10"  (1.778 m)   Wt 164 lb (74.4 kg)   SpO2 96%   BMI 23.53 kg/m  GEN: Well nourished, well developed, in no acute distress. HEENT: normal. Neck: Supple, no JVD, carotid bruits, or masses. Cardiac: RRR, 2/6 murmur, no rubs, or gallops. No clubbing, cyanosis, edema.  Radials/DP/PT 2+ and equal bilaterally.  Respiratory:  Respirations regular and unlabored, clear to auscultation bilaterally. GI: Soft, nontender,  nondistended, BS + x 4. MS: no deformity or atrophy. Skin: warm and dry, no rash. Neuro:  Strength and sensation are intact. Psych: Normal affect.  Accessory Clinical Findings    ECG personally reviewed by me today -    - no EKG in office today.   Lab Results  Component Value Date   WBC 7.1 08/29/2022   HGB 16.2 08/29/2022   HCT 48.2 08/29/2022   MCV 93.6 08/29/2022   PLT 150 08/29/2022   Lab Results  Component Value Date   CREATININE 0.98 03/28/2023   BUN 27 03/28/2023   NA 142 03/28/2023   K 4.6 03/28/2023   CL 104 03/28/2023   CO2 22 03/28/2023   Lab Results  Component Value Date   ALT 20 08/29/2022   AST 22 08/29/2022   ALKPHOS 66 12/16/2017   BILITOT 0.9 08/29/2022   Lab Results  Component Value Date   CHOL 146 02/28/2022   HDL 40 02/28/2022   LDLCALC 85 02/28/2022   TRIG 109 02/28/2022   CHOLHDL 3.7 02/28/2022    Lab Results  Component Value Date   HGBA1C 5.6 02/28/2022    Assessment & Plan    1.  ***      Joylene Grapes, NP 04/24/2023, 10:32 AM

## 2023-04-25 ENCOUNTER — Encounter: Payer: Self-pay | Admitting: Nurse Practitioner

## 2023-04-27 ENCOUNTER — Other Ambulatory Visit: Payer: Medicare Other

## 2023-05-22 ENCOUNTER — Ambulatory Visit: Payer: Medicare Other | Admitting: Cardiology

## 2023-06-26 ENCOUNTER — Telehealth: Payer: Medicare Other | Admitting: Family Medicine

## 2023-07-10 NOTE — Progress Notes (Signed)
 HPI: FU aortic insufficiency. CTA October 2023 showed mildly dilated ascending aorta at 4 cm.  Abdominal ultrasound December 2023 showed no abdominal aortic aneurysm but there was note of abnormal dilatation of the right proximal common iliac vein.  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  Monitor October 2024 showed sinus rhythm with PACs, short runs of SVT, PVCs, rare couplet and triplet.  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  Since last seen patient denies dyspnea on exertion, orthopnea, PND, chest pain or syncope.  He had palpitations previously but these have improved.  Current Outpatient Medications  Medication Sig Dispense Refill   aspirin EC 81 MG tablet Take 81 mg by mouth daily.     losartan  (COZAAR ) 100 MG tablet Take 1 tablet by mouth once daily 90 tablet 3   magnesium oxide (MAG-OX) 400 MG tablet Take 800 mg by mouth daily.     metoprolol  succinate (TOPROL -XL) 25 MG 24 hr tablet Take 1 tablet by mouth once daily 90 tablet 3   simvastatin  (ZOCOR ) 20 MG tablet Take 1 tablet by mouth once daily 90 tablet 3   benzonatate  (TESSALON ) 200 MG capsule Take 1 capsule (200 mg total) by mouth 2 (two) times daily as needed for cough. 20 capsule 0   predniSONE  (DELTASONE ) 50 MG tablet Take one tablet daily for 5 days. 5 tablet 0   No current facility-administered medications for this visit.     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   COLONOSCOPY  07/16/2018   Dr.Perry   POLYPECTOMY     UPPER GASTROINTESTINAL ENDOSCOPY      Social History   Socioeconomic History   Marital status: Significant Other    Spouse name: Macario   Number of children: 2   Years of education: 18   Highest education level: Master's degree (e.g., MA, MS, MEng, MEd, MSW, MBA)  Occupational History   Occupation: [employer]    Comment: retired  Tobacco Use   Smoking status: Former   Smokeless tobacco: Never  Advertising Account Planner   Vaping status: Never Used  Substance and Sexual Activity   Alcohol use: No    Comment: quit 20 years ago   Drug use: No    Comment: quit 1994 (cocaine,pot,anything could get hands on)   Sexual activity: Yes  Other Topics Concern   Not on file  Social History Narrative   Lives with his significant other. He has two children. He enjoys reading and stock market.   Social Drivers of Corporate Investment Banker Strain: Low Risk  (08/05/2022)   Overall Financial Resource Strain (CARDIA)    Difficulty of Paying Living Expenses: Not hard at all  Food Insecurity: No Food Insecurity (08/05/2022)   Hunger Vital Sign    Worried About Running  Out of Food in the Last Year: Never true    Ran Out of Food in the Last Year: Never true  Transportation Needs: No Transportation Needs (08/05/2022)   PRAPARE - Administrator, Civil Service (Medical): No    Lack of Transportation (Non-Medical): No  Physical Activity: Sufficiently Active (08/05/2022)   Exercise Vital Sign    Days of Exercise per Week: 6 days    Minutes of Exercise per Session: 30 min  Stress: Stress Concern Present (08/05/2022)   [employer] of Occupational Health - Occupational Stress Questionnaire    Feeling of Stress : To some extent  Social Connections: Unknown (08/08/2022)   Social Connection and Isolation Panel [NHANES]    Frequency of Communication with  Friends and Family: Once a week    Frequency of Social Gatherings with Friends and Family: More than three times a week    Attends Religious Services: Not on Marketing Executive or Organizations: No    Attends Banker Meetings: Never    Marital Status: Living with partner  Intimate Partner Violence: Not At Risk (08/08/2022)   Humiliation, Afraid, Rape, and Kick questionnaire    Fear of Current or Ex-Partner: No    Emotionally Abused: No    Physically Abused: No    Sexually Abused: No    Family History  Problem Relation Age of Onset   Skin cancer Father    Colon polyps Mother        benign   Dementia Mother    Colon cancer Neg Hx    Stomach cancer Neg Hx    Esophageal cancer Neg Hx    Rectal cancer Neg Hx     ROS: no fevers or chills, productive cough, hemoptysis, dysphasia, odynophagia, melena, hematochezia, dysuria, hematuria, rash, seizure activity, orthopnea, PND, pedal edema, claudication. Remaining systems are negative.  Physical Exam: Well-developed well-nourished in no acute distress.  Skin is warm and dry.  HEENT is normal.  Neck is supple.  Chest is clear to auscultation with normal expansion.  Cardiovascular exam is irregular.  2/6 systolic murmur left sternal border. Abdominal exam nontender or distended. No masses palpated. Extremities show no edema. neuro grossly intact  EKG Interpretation Date/Time:  Wednesday July 12 2023 14:47:58 EST Ventricular Rate:  54 PR Interval:  204 QRS Duration:  90 QT Interval:  426 QTC Calculation: 403 R Axis:   22  Text Interpretation: Sinus bradycardia with Blocked Premature atrial complexes with occasional Premature ventricular complexes When compared with ECG of 22-Jun-2015 22:17, Premature ventricular complexes are now Present Premature atrial complexes are now Present Confirmed by Pietro Rogue ([id number]) on 07/12/2023 2:50:46 PM     A/P  1 aortic insufficiency-moderate to severe on most  recent echocardiogram.  Plan repeat echocardiogram.  He understands he will likely require aortic valve replacement in the future.  2 dilated ascending aorta-will plan follow-up CTA October 2025.  3 hyperlipidemia-continue statin.  4 hypertension-patient's blood pressure is controlled.  Continue present medical regimen.  5 palpitations-continue beta-blocker.  Recent monitor as outlined.  Rogue Pietro, MD

## 2023-07-12 ENCOUNTER — Ambulatory Visit: Payer: Medicare Other | Admitting: Cardiology

## 2023-07-12 ENCOUNTER — Encounter: Payer: Self-pay | Admitting: Cardiology

## 2023-07-12 VITALS — BP 118/60 | HR 60 | Ht 70.0 in | Wt 167.0 lb

## 2023-07-12 DIAGNOSIS — I10 Essential (primary) hypertension: Secondary | ICD-10-CM | POA: Diagnosis not present

## 2023-07-12 DIAGNOSIS — I351 Nonrheumatic aortic (valve) insufficiency: Secondary | ICD-10-CM

## 2023-07-12 DIAGNOSIS — R002 Palpitations: Secondary | ICD-10-CM

## 2023-07-12 DIAGNOSIS — I712 Thoracic aortic aneurysm, without rupture, unspecified: Secondary | ICD-10-CM | POA: Diagnosis not present

## 2023-07-12 DIAGNOSIS — E782 Mixed hyperlipidemia: Secondary | ICD-10-CM | POA: Diagnosis not present

## 2023-07-12 NOTE — Patient Instructions (Signed)
   Testing/Procedures:  Your physician has requested that you have an echocardiogram. Echocardiography is a painless test that uses sound waves to create images of your heart. It provides your doctor with information about the size and shape of your heart and how well your heart's chambers and valves are working. This procedure takes approximately one hour. There are no restrictions for this procedure. Please do NOT wear cologne, perfume, aftershave, or lotions (deodorant is allowed). Please arrive 15 minutes prior to your appointment time.  Please note: We ask at that you not bring children with you during ultrasound (echo/ vascular) testing. Due to room size and safety concerns, children are not allowed in the ultrasound rooms during exams. Our front office staff cannot provide observation of children in our lobby area while testing is being conducted. An adult accompanying a patient to their appointment will only be allowed in the ultrasound room at the discretion of the ultrasound technician under special circumstances. We apologize for any inconvenience. [facility] FLOOR IMAGING DEPARTMENT   Follow-Up: At [facility], you and your health needs are our priority.  As part of our continuing mission to provide you with exceptional heart care, we have created designated Provider Care Teams.  These Care Teams include your primary Cardiologist (physician) and Advanced Practice Providers (APPs -  Physician Assistants and Nurse Practitioners) who all work together to provide you with the care you need, when you need it.  We recommend signing up for the patient portal called MyChart.  Sign up information is provided on this After Visit Summary.  MyChart is used to connect with patients for Virtual Visits (Telemedicine).  Patients are able to view lab/test results, encounter notes, upcoming appointments, etc.  Non-urgent messages can be sent to your provider as well.   To learn  more about what you can do with MyChart, go to [email or web address].    Your next appointment:   6 month(s)  Provider:   Redell Shallow, MD

## 2023-07-17 ENCOUNTER — Encounter: Payer: Self-pay | Admitting: Cardiology

## 2023-08-08 ENCOUNTER — Other Ambulatory Visit: Payer: Self-pay | Admitting: *Deleted

## 2023-08-08 ENCOUNTER — Ambulatory Visit (HOSPITAL_BASED_OUTPATIENT_CLINIC_OR_DEPARTMENT_OTHER)
Admission: RE | Admit: 2023-08-08 | Discharge: 2023-08-08 | Disposition: A | Discharge status: Home / Self Care | Payer: Medicare Other | Source: Ambulatory Visit | Attending: Cardiology | Admitting: Cardiology

## 2023-08-08 ENCOUNTER — Encounter: Payer: Self-pay | Admitting: *Deleted

## 2023-08-08 DIAGNOSIS — I351 Nonrheumatic aortic (valve) insufficiency: Secondary | ICD-10-CM | POA: Diagnosis not present

## 2023-08-08 LAB — ECHOCARDIOGRAM COMPLETE
AR max vel: 2.16 cm2
AV Area VTI: 2.13 cm2
AV Area mean vel: 2.12 cm2
AV Mean grad: 5 mm[Hg]
AV Peak grad: 9.6 mm[Hg]
AV Vena cont: 0.4 cm
Ao pk vel: 1.55 m/s
Area-P 1/2: 4.01 cm2
Calc EF: 62.7 %
MV M vel: 4.24 m/s
MV Peak grad: 71.9 mm[Hg]
P 1/2 time: 658 ms
S' Lateral: 2.9 cm
Single Plane A2C EF: 63.5 %
Single Plane A4C EF: 60.2 %

## 2023-08-14 ENCOUNTER — Ambulatory Visit (INDEPENDENT_AMBULATORY_CARE_PROVIDER_SITE_OTHER): Payer: Medicare Other

## 2023-08-14 VITALS — Ht 70.0 in | Wt 170.0 lb

## 2023-08-14 DIAGNOSIS — Z0000 Encounter for general adult medical examination without abnormal findings: Secondary | ICD-10-CM | POA: Diagnosis not present

## 2023-08-14 NOTE — Addendum Note (Signed)
 Addended by: Doretha Ganja on: 08/14/2023 02:42 PM   Modules accepted: Level of Service

## 2023-08-14 NOTE — Patient Instructions (Signed)
  Mr. Nicholas Murillo , Thank you for taking time to come for your Medicare Wellness Visit. I appreciate your ongoing commitment to your health goals. Please review the following plan we discussed and let me know if I can assist you in the future.   These are the goals we discussed:  Goals       DIET - INCREASE WATER INTAKE      Wants to increase water intake up to the 64 ounces daily      DIET - INCREASE WATER INTAKE      Wants to work on increase water intake.       Patient Stated      Patient stated he would like to be able to increase his ability to communicate with others again and not isolate hisself      Patient Stated (pt-stated)      Patient stated that that he would like maintain his communication skills and be more mindful.        This is a list of the screening recommended for you and due dates:  Health Maintenance  Topic Date Due   Zoster (Shingles) Vaccine (1 of 2) 11/13/1964   COVID-19 Vaccine (7 - 2024-25 season) 06/02/2023   Medicare Annual Wellness Visit  08/13/2024   DTaP/Tdap/Td vaccine (3 - Td or Tdap) 12/12/2031   Pneumonia Vaccine  Completed   Flu Shot  Completed   Hepatitis C Screening  Completed   HPV Vaccine  Aged Out   Colon Cancer Screening  Discontinued   Cologuard (Stool DNA test)  Discontinued

## 2023-08-14 NOTE — Progress Notes (Signed)
 Subjective:   Nicholas Murillo is a 78 y.o. male who presents for Medicare Annual/Subsequent preventive examination.  Visit Complete: Virtual I connected with  Nicholas Murillo on 08/14/23 by a audio enabled telemedicine application and verified that I am speaking with the correct person using two identifiers.  Patient Location: Home  Provider Location: Office/Clinic  I discussed the limitations of evaluation and management by telemedicine. The patient expressed understanding and agreed to proceed.  Vital Signs: Because this visit was a virtual/telehealth visit, some criteria may be missing or patient reported. Any vitals not documented were not able to be obtained and vitals that have been documented are patient reported.  Patient Medicare AWV questionnaire was completed by the patient on 08/06/2023; I have confirmed that all information answered by patient is correct and no changes since this date.  Cardiac Risk Factors include: advanced age (>53men, >28 women);dyslipidemia;male gender;hypertension     Objective:    Today's Vitals   08/14/23 0951  Weight: 170 lb (77.1 kg)  Height: 5\' 10"  (1.778 m)   Body mass index is 24.39 kg/m.     08/08/2022    9:26 AM 07/08/2019    7:58 AM 07/02/2018    9:10 AM 12/16/2017    2:40 PM 06/22/2015   10:06 PM  Advanced Directives  Does Patient Have a Medical Advance Directive? No No No No No  Would patient like information on creating a medical advance directive? No - Patient declined No - Patient declined No - Patient declined No - Patient declined     Current Medications (verified) Outpatient Encounter Medications as of 08/14/2023  Medication Sig   aspirin EC 81 MG tablet Take 81 mg by mouth daily.   losartan  (COZAAR ) 100 MG tablet Take 1 tablet by mouth once daily   magnesium oxide (MAG-OX) 400 MG tablet Take 800 mg by mouth daily.   metoprolol  succinate (TOPROL -XL) 25 MG 24 hr tablet Take 1 tablet by mouth once daily   simvastatin  (ZOCOR )  20 MG tablet Take 1 tablet by mouth once daily   [DISCONTINUED] benzonatate  (TESSALON ) 200 MG capsule Take 1 capsule (200 mg total) by mouth 2 (two) times daily as needed for cough.   [DISCONTINUED] predniSONE  (DELTASONE ) 50 MG tablet Take one tablet daily for 5 days.   No facility-administered encounter medications on file as of 08/14/2023.    Allergies (verified) Patient has no known allergies.   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   COLONOSCOPY  07/16/2018   Dr.Perry   POLYPECTOMY     UPPER GASTROINTESTINAL ENDOSCOPY     Family History  Problem Relation Age of Onset   Skin cancer Father    Colon polyps Mother        benign   Dementia Mother    Colon cancer Neg Hx    Stomach cancer Neg Hx    Esophageal cancer Neg Hx    Rectal cancer Neg Hx    Social History  Socioeconomic History   Marital status: Significant Other    Spouse name: Tanis Fan   Number of children: 2   Years of education: 85   Highest education level: Master's degree (e.g., MA, MS, MEng, MEd, MSW, MBA)  Occupational History   Occupation: [employer]    Comment: retired  Tobacco Use   Smoking status: Former   Smokeless tobacco: Never  Advertising account planner   Vaping status: Never Used  Substance and Sexual Activity   Alcohol use: No    Comment: quit 20 years ago   Drug use: No    Comment: quit 1994 (cocaine,pot,"anything could get hands on")   Sexual activity: Yes  Other Topics Concern   Not on file  Social History Narrative   Lives with his significant other. He has two children.  He enjoys reading and stock market.   Social Drivers of Corporate investment banker Strain: Low Risk  (08/14/2023)   Overall Financial Resource Strain (CARDIA)    Difficulty of Paying Living Expenses: Not very hard  Food Insecurity: No Food Insecurity (08/14/2023)   Hunger Vital Sign    Worried About Running Out of Food in the Last Year: Never true    Ran Out of Food in the Last Year: Never true  Transportation Needs: No Transportation Needs (08/14/2023)   PRAPARE - Administrator, Civil Service (Medical): No    Lack of Transportation (Non-Medical): No  Physical Activity: Sufficiently Active (08/14/2023)   Exercise Vital Sign    Days of Exercise per Week: 5 days    Minutes of Exercise per Session: 30 min  Stress: Stress Concern Present (08/14/2023)   [employer] of Occupational Health - Occupational Stress Questionnaire    Feeling of Stress : To some extent  Social Connections: Moderately Isolated (08/14/2023)   Social Connection and Isolation Panel [NHANES]    Frequency of Communication with Friends and Family: Once a week    Frequency of Social Gatherings with Friends and Family: More than three times a week    Attends Religious Services: Never    Database administrator or Organizations: No    Attends Engineer, structural: Never    Marital Status: Living with partner    Tobacco Counseling Counseling given: Not Answered   Clinical Intake:  Pre-visit preparation completed: Yes  Pain : No/denies pain     BMI - recorded: 24.39 Nutritional Status: BMI of 19-24  Normal Nutritional Risks: None Diabetes: No  How often do you need to have someone help you when you read instructions, pamphlets, or other written materials from your doctor or pharmacy?: 1 - Never What is the last grade level you completed in school?: 16  Interpreter Needed?: No      Activities of Daily Living    08/14/2023    9:53 AM 08/09/2023   11:37 AM  In your present state of  health, do you have any difficulty performing the following activities:  Hearing? 0 0  Vision? 0 0  Difficulty concentrating or making decisions? 0 0  Walking or climbing stairs? 0 0  Dressing or bathing? 0 0  Doing errands, shopping? 0 0  Preparing Food and eating ? N N  Using the Toilet? N N  In the past six months, have you accidently leaked urine? N N  Do you have problems with loss of bowel control? N N  Managing your Medications? N N  Managing your Finances? N N  Housekeeping or managing your Housekeeping? N  N    Patient Care Team: Adela Holter, DO as PCP - General (Family Medicine) Audery Blazing Deannie Fabian, MD as PCP - Cardiology (Cardiology) Christina Coyer, MD as Consulting Physician (Urology)  Indicate any recent Medical Services you may have received from other than [facility] providers in the past year (date may be approximate).     Assessment:   This is a routine wellness examination for Cean.  Hearing/Vision screen Hearing Screening - Comments:: Unable to test Vision Screening - Comments:: Unable to test.    Goals Addressed             This Visit's Progress    DIET - INCREASE WATER INTAKE       Wants to work on increase water intake.       Depression Screen    08/14/2023   10:01 AM 01/02/2023    3:47 PM 01/02/2023    2:48 PM 08/08/2022    9:31 AM 02/24/2022   10:23 AM 08/26/2021   10:07 AM 07/08/2019    7:59 AM  PHQ 2/9 Scores  PHQ - 2 Score 0 2 2 1 4 2  0  PHQ- 9 Score  4   6      Fall Risk    08/14/2023   10:06 AM 08/09/2023   11:37 AM 01/02/2023    2:49 PM 08/08/2022    9:16 AM 08/05/2022   10:55 AM  Fall Risk   Falls in the past year? 0 1 0 0 0  Number falls in past yr: 0  0 0 0  Injury with Fall? 0 0 0 0 0  Risk for fall due to : No Fall Risks  No Fall Risks No Fall Risks   Follow up Falls evaluation completed  Falls evaluation completed Education provided;Falls evaluation completed     MEDICARE RISK AT HOME: Medicare Risk at Home Any stairs in or  around the home?: Yes If so, are there any without handrails?: No Home free of loose throw rugs in walkways, pet beds, electrical cords, etc?: Yes Adequate lighting in your home to reduce risk of falls?: Yes Life alert?: No Use of a cane, walker or w/c?: No Grab bars in the bathroom?: No Shower chair or bench in shower?: (Patient-Rptd) No Elevated toilet seat or a handicapped toilet?: No  TIMED UP AND GO:  Was the test performed?  No    Cognitive Function:        08/14/2023   10:07 AM 08/08/2022    9:34 AM 07/08/2019    8:04 AM 07/02/2018    9:21 AM  6CIT Screen  What Year? 0 points 0 points 0 points 0 points  What month? 0 points 0 points 0 points 0 points  What time? 0 points 0 points 0 points 0 points  Count back from 20 0 points 0 points 0 points 0 points  Months in reverse 0 points 0 points 0 points 0 points  Repeat phrase 2 points 0 points 0 points 2 points  Total Score 2 points 0 points 0 points 2 points    Immunizations Immunization History  Administered Date(s) Administered   Fluad Quad(high Dose 65+) 03/06/2019, 03/15/2021   Influenza, High Dose Seasonal PF 04/26/2022   Influenza,inj,Quad PF,6+ Mos 03/13/2018   PFIZER(Purple Top)SARS-COV-2 Vaccination 08/15/2019, 09/09/2019, 10/13/2020   Pfizer Covid-19 Vaccine Bivalent Booster 66yrs & up 03/15/2021, 03/29/2022   Pneumococcal Conjugate-13 09/06/2016   Pneumococcal Polysaccharide-23 07/18/2012   RSV,unspecified 04/12/2022   Tdap 02/07/2013, 12/11/2021   Zoster, Live 09/03/2012  TDAP status: Up to date  Flu Vaccine status: Up to date  Pneumococcal vaccine status: Due, Education has been provided regarding the importance of this vaccine. Advised may receive this vaccine at local pharmacy or Health Dept. Aware to provide a copy of the vaccination record if obtained from local pharmacy or Health Dept. Verbalized acceptance and understanding.  Covid-19 vaccine status: Completed vaccines  Qualifies for Shingles  Vaccine? Yes   Zostavax completed Yes   Shingrix Completed?: No.    Education has been provided regarding the importance of this vaccine. Patient has been advised to call insurance company to determine out of pocket expense if they have not yet received this vaccine. Advised may also receive vaccine at local pharmacy or Health Dept. Verbalized acceptance and understanding.  Screening Tests Health Maintenance  Topic Date Due   Zoster Vaccines- Shingrix (1 of 2) 11/13/1964   COVID-19 Vaccine (6 - 2024-25 season) 03/05/2023   Medicare Annual Wellness (AWV)  08/13/2024   DTaP/Tdap/Td (3 - Td or Tdap) 12/12/2031   Pneumonia Vaccine 14+ Years old  Completed   INFLUENZA VACCINE  Completed   Hepatitis C Screening  Completed   HPV VACCINES  Aged Out   Colonoscopy  Discontinued   Fecal DNA (Cologuard)  Discontinued    Health Maintenance  Health Maintenance Due  Topic Date Due   Zoster Vaccines- Shingrix (1 of 2) 11/13/1964   COVID-19 Vaccine (6 - 2024-25 season) 03/05/2023    Colorectal cancer screening: No longer required.   Lung Cancer Screening: (Low Dose CT Chest recommended if Age 64-80 years, 20 pack-year currently smoking OR have quit w/in 15years.) does not qualify.   Lung Cancer Screening Referral: n/a  Additional Screening:  Hepatitis C Screening: does qualify; Completed 09/06/2016  Vision Screening: Recommended annual ophthalmology exams for early detection of glaucoma and other disorders of the eye. Is the patient up to date with their annual eye exam?  Yes  Who is the provider or what is the name of the office in which the patient attends annual eye exams? [facility] If pt is not established with a provider, would they like to be referred to a provider to establish care?  N/a .   Dental Screening: Recommended annual dental exams for proper oral hygiene    Community Resource Referral / Chronic Care Management: CRR required this visit?  No   CCM required this  visit?  No     Plan:     I have personally reviewed and noted the following in the patient's chart:   Medical and social history Use of alcohol, tobacco or illicit drugs  Current medications and supplements including opioid prescriptions. Patient is not currently taking opioid prescriptions. Functional ability and status Nutritional status Physical activity Advanced directives List of other physicians Hospitalizations, surgeries, and ER visits in previous 12 months. No Vitals Screenings to include cognitive, depression, and falls Referrals and appointments  In addition, I have reviewed and discussed with patient certain preventive protocols, quality metrics, and best practice recommendations. A written personalized care plan for preventive services as well as general preventive health recommendations were provided to patient.     Aubrey Leaf, CMA   08/14/2023   After Visit Summary: (MyChart) Due to this being a telephonic visit, the after visit summary with patients personalized plan was offered to patient via MyChart   Nurse Notes:   Nicholas Murillo Telephone visit for [facility] Wellness Visit. His hobby is reading and learning.   Recommended Prevnar 20  vaccine and ShingRix vaccine.

## 2023-08-23 ENCOUNTER — Other Ambulatory Visit: Payer: Self-pay | Admitting: Family Medicine

## 2023-08-23 DIAGNOSIS — R739 Hyperglycemia, unspecified: Secondary | ICD-10-CM

## 2023-08-23 DIAGNOSIS — E785 Hyperlipidemia, unspecified: Secondary | ICD-10-CM

## 2023-08-23 DIAGNOSIS — I10 Essential (primary) hypertension: Secondary | ICD-10-CM

## 2023-09-28 ENCOUNTER — Encounter: Payer: Self-pay | Admitting: Family Medicine

## 2023-09-28 NOTE — Telephone Encounter (Signed)
 Patient scheduled.

## 2023-10-03 ENCOUNTER — Ambulatory Visit (INDEPENDENT_AMBULATORY_CARE_PROVIDER_SITE_OTHER): Admitting: Family Medicine

## 2023-10-03 ENCOUNTER — Encounter: Payer: Self-pay | Admitting: Family Medicine

## 2023-10-03 VITALS — BP 124/57 | HR 49 | Ht 70.0 in | Wt 166.0 lb

## 2023-10-03 DIAGNOSIS — L309 Dermatitis, unspecified: Secondary | ICD-10-CM | POA: Insufficient documentation

## 2023-10-03 DIAGNOSIS — F320 Major depressive disorder, single episode, mild: Secondary | ICD-10-CM

## 2023-10-03 DIAGNOSIS — R4586 Emotional lability: Secondary | ICD-10-CM | POA: Insufficient documentation

## 2023-10-03 MED ORDER — TRIAMCINOLONE ACETONIDE 0.1 % EX CREA: 1.0000 | TOPICAL_CREAM | Freq: Two times a day (BID) | CUTANEOUS | 0 refills | Status: AC

## 2023-10-03 NOTE — Progress Notes (Signed)
 Nicholas Murillo - 78 y.o. male MRN [id number]  Date of birth: 1945-08-11  Subjective Chief Complaint  Patient presents with   Mood   Rash    HPI Nicholas Murillo is a 78 year old male here today to discuss changes in mood.  He reports increased irritability along with some depressive symptoms.  Reports that some of this revolves around current political climate and feeling that worsening things are to come.  He is unsure if he would like to have referral for talk therapy.  He is not thinking about that medication at this time.  He has had a dry, itchy area to the right forearm.  This does seem to be improving some at this point.  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   COLONOSCOPY  07/16/2018   Dr.Perry   POLYPECTOMY     UPPER GASTROINTESTINAL ENDOSCOPY      Social History   Socioeconomic History   Marital status: Significant Other    Spouse name: Larita Fife   Number of children: 2   Years of education: 18   Highest education level: Master's degree (e.g., MA, MS, MEng, MEd, MSW, MBA)  Occupational History   Occupation: [employer]    Comment: retired  Tobacco Use   Smoking status: Former   Smokeless tobacco: Never  Advertising account planner   Vaping status: Never Used  Substance and Sexual Activity   Alcohol use: No    Comment: quit 20 years ago   Drug use: No    Comment: quit 1994 (cocaine,pot,"anything could get hands on")   Sexual activity: Yes  Other Topics Concern    Not on file  Social History Narrative   Lives with his significant other. He has two children. He enjoys reading and stock market.   Social Drivers of Corporate investment banker Strain: Low Risk  (08/14/2023)   Overall Financial Resource Strain (CARDIA)    Difficulty of Paying Living Expenses: Not very hard  Food Insecurity: No Food Insecurity (08/14/2023)   Hunger Vital Sign    Worried About Running Out of Food in the Last Year: Never true    Ran Out of Food in the Last Year: Never true  Transportation Needs: No Transportation Needs (08/14/2023)   PRAPARE - Administrator, Civil Service (Medical): No    Lack of Transportation (Non-Medical): No  Physical Activity: Sufficiently Active (08/14/2023)   Exercise Vital Sign    Days of Exercise per Week: 5 days    Minutes of Exercise per Session: 30 min  Stress: Stress Concern Present (08/14/2023)   [employer] of Occupational Health - Occupational Stress Questionnaire    Feeling of Stress : To some extent  Social Connections: Moderately Isolated (08/14/2023)   Social Connection and Isolation Panel [NHANES]    Frequency of Communication with Friends and Family: Once a week    Frequency  of Social Gatherings with Friends and Family: More than three times a week    Attends Religious Services: Never    Database administrator or Organizations: No    Attends Engineer, structural: Never    Marital Status: Living with partner    Family History  Problem Relation Age of Onset   Skin cancer Father    Colon polyps Mother        benign   Dementia Mother    Colon cancer Neg Hx    Stomach cancer Neg Hx    Esophageal cancer Neg Hx    Rectal cancer Neg Hx     Health Maintenance  Topic Date Due   Zoster Vaccines- Shingrix (1 of 2) 11/13/1964   COVID-19 Vaccine (7 - Pfizer risk 2024-25 season) 10/06/2023   INFLUENZA VACCINE  02/02/2024   Medicare Annual Wellness (AWV)  08/13/2024   DTaP/Tdap/Td (3 - Td or Tdap)  12/12/2031   Pneumonia Vaccine 68+ Years old  Completed   Hepatitis C Screening  Completed   HPV VACCINES  Aged Out   Colonoscopy  Discontinued   Fecal DNA (Cologuard)  Discontinued     ----------------------------------------------------------------------------------------------------------------------------------------------------------------------------------------------------------------- Physical Exam BP (!) 124/57 (BP Location: Left Arm, Patient Position: Sitting, Cuff Size: Normal)   Pulse (!) 49   Ht 5\' 10"  (1.778 m)   Wt 166 lb (75.3 kg)   SpO2 97%   BMI 23.82 kg/m   Physical Exam Constitutional:      Appearance: Normal appearance.  HENT:     Head: Normocephalic and atraumatic.  Eyes:     General: No scleral icterus. Musculoskeletal:     Cervical back: Neck supple.  Skin:    Comments: Dry, scaly patch with hyperpigmentation on the right arm.  Neurological:     Mental Status: He is alert.     ------------------------------------------------------------------------------------------------------------------------------------------------------------------------------------------------------------------- Assessment and Plan  Eczema Eczematous changes to skin of the right forearm.  Adding triamcinolone to use as needed.  He will let me know if not improving with this.  Mood altered He is open to trying talk therapy.  Referral placed to get this set up for him.  Does not want to add medication at this time which I think is reasonable.   Meds ordered this encounter  Medications   triamcinolone cream (KENALOG) 0.1 %    Sig: Apply 1 Application topically 2 (two) times daily.    Dispense:  30 g    Refill:  0    No follow-ups on file.    This visit occurred during the SARS-CoV-2 public health emergency.  Safety protocols were in place, including screening questions prior to the visit, additional usage of staff PPE, and extensive cleaning of exam room while  observing appropriate contact time as indicated for disinfecting solutions.

## 2023-10-03 NOTE — Assessment & Plan Note (Signed)
 Eczematous changes to skin of the right forearm.  Adding triamcinolone to use as needed.  He will let me know if not improving with this.

## 2023-10-03 NOTE — Assessment & Plan Note (Signed)
 He is open to trying talk therapy.  Referral placed to get this set up for him.  Does not want to add medication at this time which I think is reasonable.

## 2023-10-26 DIAGNOSIS — Z23 Encounter for immunization: Secondary | ICD-10-CM | POA: Diagnosis not present

## 2023-11-19 ENCOUNTER — Other Ambulatory Visit: Payer: Self-pay | Admitting: Family Medicine

## 2023-11-19 DIAGNOSIS — I10 Essential (primary) hypertension: Secondary | ICD-10-CM

## 2023-11-19 DIAGNOSIS — R739 Hyperglycemia, unspecified: Secondary | ICD-10-CM

## 2023-11-19 DIAGNOSIS — E785 Hyperlipidemia, unspecified: Secondary | ICD-10-CM

## 2023-11-20 ENCOUNTER — Ambulatory Visit: Admitting: Behavioral Health

## 2023-12-04 DIAGNOSIS — R972 Elevated prostate specific antigen [PSA]: Secondary | ICD-10-CM | POA: Diagnosis not present

## 2023-12-05 ENCOUNTER — Encounter: Payer: Self-pay | Admitting: Behavioral Health

## 2023-12-05 ENCOUNTER — Ambulatory Visit: Admitting: Behavioral Health

## 2023-12-05 DIAGNOSIS — R454 Irritability and anger: Secondary | ICD-10-CM | POA: Diagnosis not present

## 2023-12-05 DIAGNOSIS — F411 Generalized anxiety disorder: Secondary | ICD-10-CM

## 2023-12-05 NOTE — Progress Notes (Signed)
   Nicholas Murillo, [facility]
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
## 2023-12-11 DIAGNOSIS — R3915 Urgency of urination: Secondary | ICD-10-CM | POA: Diagnosis not present

## 2023-12-11 DIAGNOSIS — N401 Enlarged prostate with lower urinary tract symptoms: Secondary | ICD-10-CM | POA: Diagnosis not present

## 2023-12-11 DIAGNOSIS — R972 Elevated prostate specific antigen [PSA]: Secondary | ICD-10-CM | POA: Diagnosis not present

## 2023-12-27 ENCOUNTER — Encounter: Payer: Self-pay | Admitting: Behavioral Health

## 2023-12-27 ENCOUNTER — Ambulatory Visit: Admitting: Behavioral Health

## 2023-12-27 DIAGNOSIS — R454 Irritability and anger: Secondary | ICD-10-CM

## 2023-12-27 DIAGNOSIS — F411 Generalized anxiety disorder: Secondary | ICD-10-CM

## 2023-12-27 NOTE — Progress Notes (Signed)
   Nicholas Murillo, [facility]
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
## 2024-01-09 ENCOUNTER — Ambulatory Visit (INDEPENDENT_AMBULATORY_CARE_PROVIDER_SITE_OTHER): Admitting: Behavioral Health

## 2024-01-09 ENCOUNTER — Encounter: Payer: Self-pay | Admitting: Behavioral Health

## 2024-01-09 DIAGNOSIS — F411 Generalized anxiety disorder: Secondary | ICD-10-CM

## 2024-01-09 DIAGNOSIS — R454 Irritability and anger: Secondary | ICD-10-CM
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
## 2024-01-26 ENCOUNTER — Encounter: Payer: Self-pay | Admitting: Behavioral Health

## 2024-01-26 ENCOUNTER — Ambulatory Visit: Admitting: Behavioral Health

## 2024-01-26 DIAGNOSIS — R454 Irritability and anger: Secondary | ICD-10-CM

## 2024-01-26 DIAGNOSIS — F411 Generalized anxiety disorder: Secondary | ICD-10-CM | POA: Diagnosis not present
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
## 2024-02-08 ENCOUNTER — Encounter: Payer: Self-pay | Admitting: Behavioral Health

## 2024-02-14 ENCOUNTER — Other Ambulatory Visit: Payer: Self-pay | Admitting: Family Medicine

## 2024-02-14 DIAGNOSIS — R739 Hyperglycemia, unspecified: Secondary | ICD-10-CM

## 2024-02-14 DIAGNOSIS — E785 Hyperlipidemia, unspecified: Secondary | ICD-10-CM

## 2024-02-14 DIAGNOSIS — I10 Essential (primary) hypertension: Secondary | ICD-10-CM

## 2024-02-20 ENCOUNTER — Ambulatory Visit (INDEPENDENT_AMBULATORY_CARE_PROVIDER_SITE_OTHER): Admitting: Behavioral Health

## 2024-02-20 ENCOUNTER — Encounter: Payer: Self-pay | Admitting: Behavioral Health

## 2024-02-20 DIAGNOSIS — F411 Generalized anxiety disorder: Secondary | ICD-10-CM | POA: Diagnosis not present

## 2024-02-20 DIAGNOSIS — R454 Irritability and anger: Secondary | ICD-10-CM
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
## 2024-02-21 DIAGNOSIS — L570 Actinic keratosis: Secondary | ICD-10-CM | POA: Diagnosis not present

## 2024-02-21 DIAGNOSIS — L578 Other skin changes due to chronic exposure to nonionizing radiation: Secondary | ICD-10-CM | POA: Diagnosis not present

## 2024-02-21 DIAGNOSIS — L821 Other seborrheic keratosis: Secondary | ICD-10-CM | POA: Diagnosis not present

## 2024-02-29 ENCOUNTER — Ambulatory Visit: Admitting: Behavioral Health

## 2024-02-29 DIAGNOSIS — R454 Irritability and anger: Secondary | ICD-10-CM

## 2024-02-29 DIAGNOSIS — F411 Generalized anxiety disorder: Secondary | ICD-10-CM
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
## 2024-03-11 ENCOUNTER — Ambulatory Visit (INDEPENDENT_AMBULATORY_CARE_PROVIDER_SITE_OTHER): Admitting: Behavioral Health

## 2024-03-11 ENCOUNTER — Encounter: Payer: Self-pay | Admitting: Behavioral Health

## 2024-03-11 DIAGNOSIS — R454 Irritability and anger: Secondary | ICD-10-CM

## 2024-03-11 DIAGNOSIS — F411 Generalized anxiety disorder: Secondary | ICD-10-CM
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
## 2024-03-13 ENCOUNTER — Encounter: Payer: Self-pay | Admitting: Behavioral Health

## 2024-03-27 ENCOUNTER — Encounter: Payer: Self-pay | Admitting: Behavioral Health

## 2024-03-27 ENCOUNTER — Ambulatory Visit (INDEPENDENT_AMBULATORY_CARE_PROVIDER_SITE_OTHER): Admitting: Behavioral Health

## 2024-03-27 DIAGNOSIS — F411 Generalized anxiety disorder: Secondary | ICD-10-CM

## 2024-03-27 DIAGNOSIS — R454 Irritability and anger: Secondary | ICD-10-CM
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
## 2024-04-01 DIAGNOSIS — Z23 Encounter for immunization: Secondary | ICD-10-CM | POA: Diagnosis not present

## 2024-04-08 ENCOUNTER — Encounter: Payer: Self-pay | Admitting: Behavioral Health

## 2024-04-08 ENCOUNTER — Ambulatory Visit: Admitting: Behavioral Health

## 2024-04-08 DIAGNOSIS — R454 Irritability and anger: Secondary | ICD-10-CM

## 2024-04-08 DIAGNOSIS — F411 Generalized anxiety disorder: Secondary | ICD-10-CM | POA: Diagnosis not present
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
## 2024-04-22 ENCOUNTER — Ambulatory Visit: Admitting: Behavioral Health

## 2024-04-22 ENCOUNTER — Encounter: Payer: Self-pay | Admitting: Behavioral Health

## 2024-04-22 DIAGNOSIS — R454 Irritability and anger: Secondary | ICD-10-CM

## 2024-04-22 DIAGNOSIS — F411 Generalized anxiety disorder: Secondary | ICD-10-CM | POA: Diagnosis not present
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
## 2024-05-01 NOTE — Progress Notes (Signed)
 HPI: FU aortic insufficiency. CTA October 2023 showed mildly dilated ascending aorta at 4 cm.  Abdominal ultrasound December 2023 showed no abdominal aortic aneurysm but there was note of abnormal dilatation of the right proximal common iliac vein.  Monitor October 2024 showed sinus rhythm with PACs, short runs of SVT, PVCs, rare couplet and triplet.  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  Echo 2/25 showed normal LV function; moderate AI; mild MR. Since last seen he denies increased dyspnea on exertion, orthopnea, PND, pedal edema, chest pain or syncope.  Current Outpatient Medications  Medication Sig Dispense Refill   aspirin EC 81 MG tablet Take 81 mg by mouth daily.     losartan  (COZAAR ) 100 MG tablet Take 1 tablet by mouth once daily 90 tablet 0   magnesium oxide (MAG-OX) 400 MG tablet Take 800 mg by mouth daily.     metoprolol  succinate (TOPROL -XL) 25 MG 24 hr tablet Take 1 tablet by mouth once daily 90 tablet 0   simvastatin  (ZOCOR ) 20 MG tablet Take 1 tablet by mouth once daily 90 tablet 0   triamcinolone  cream (KENALOG ) 0.1 % Apply 1 Application topically 2 (two) times daily. 30 g 0   No current facility-administered medications for this visit.     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   COLONOSCOPY  07/16/2018   Dr.Perry   POLYPECTOMY     UPPER GASTROINTESTINAL ENDOSCOPY      Social History   Socioeconomic History   Marital status: Significant Other    Spouse name: Macario   Number of children: 2   Years of education: 18    Highest education level: Master's degree (e.g., MA, MS, MEng, MEd, MSW, MBA)  Occupational History   Occupation: [employer]    Comment: retired  Tobacco Use   Smoking status: Former   Smokeless tobacco: Never  Advertising Account Planner   Vaping status: Never Used  Substance and Sexual Activity   Alcohol use: No    Comment: quit 20 years ago   Drug use: No    Comment: quit 1994 (cocaine,pot,anything could get hands on)   Sexual activity: Yes  Other Topics Concern   Not on file  Social History Narrative   Lives with his significant other. He has two children. He enjoys reading and stock market.   Social Drivers of Corporate Investment Banker Strain: Low Risk  (08/14/2023)   Overall Financial Resource Strain (CARDIA)    Difficulty of Paying Living Expenses: Not very hard  Food Insecurity: No Food Insecurity (08/14/2023)   Hunger Vital Sign    Worried About Running Out of Food in the Last Year: Never true    Ran Out of Food in the Last Year: Never true  Transportation Needs: No Transportation Needs (08/14/2023)   PRAPARE - Administrator, Civil Service (Medical): No    Lack of Transportation (Non-Medical): No  Physical Activity: Sufficiently Active (08/14/2023)  Exercise Vital Sign    Days of Exercise per Week: 5 days    Minutes of Exercise per Session: 30 min  Stress: Stress Concern Present (08/14/2023)   [employer] of Occupational Health - Occupational Stress Questionnaire    Feeling of Stress : To some extent  Social Connections: Moderately Isolated (08/14/2023)   Social Connection and Isolation Panel    Frequency of Communication with Friends and Family: Once a week    Frequency of Social Gatherings with Friends and Family: More than three times a week    Attends Religious Services: Never    Database Administrator or Organizations: No    Attends Banker Meetings: Never    Marital Status: Living with partner  Intimate Partner Violence: Not At Risk (08/14/2023)    Humiliation, Afraid, Rape, and Kick questionnaire    Fear of Current or Ex-Partner: No    Emotionally Abused: No    Physically Abused: No    Sexually Abused: No    Family History  Problem Relation Age of Onset   Skin cancer Father    Colon polyps Mother        benign   Dementia Mother    Colon cancer Neg Hx    Stomach cancer Neg Hx    Esophageal cancer Neg Hx    Rectal cancer Neg Hx     ROS: no fevers or chills, productive cough, hemoptysis, dysphasia, odynophagia, melena, hematochezia, dysuria, hematuria, rash, seizure activity, orthopnea, PND, pedal edema, claudication. Remaining systems are negative.  Physical Exam: Well-developed well-nourished in no acute distress.  Skin is warm and dry.  HEENT is normal.  Neck is supple.  Chest is clear to auscultation with normal expansion.  Cardiovascular exam is irregular.  2/6 systolic murmur left sternal border. Abdominal exam nontender or distended. No masses palpated. Extremities show no edema. neuro grossly intact  EKG Interpretation Date/Time:  Monday May 13 2024 09:54:03 EST Ventricular Rate:  71 PR Interval:  190 QRS Duration:  84 QT Interval:  386 QTC Calculation: 419 R Axis:   22  Text Interpretation: Sinus rhythm with frequent and consecutive Premature ventricular complexes in a pattern of bigeminy Nonspecific ST abnormality Confirmed by Pietro Rogue ([id number]) on 05/13/2024 10:06:40 AM     A/P  1 aortic insufficiency-moderate on most recent echocardiogram.  Plan repeat echocardiogram 2/26.    2 dilated ascending aorta-will arrange FU CTA.  3 hyperlipidemia-continue statin.  4 hypertension-patient's blood pressure is controlled.  Continue present meds.  5 palpitations-reasonably well controlled; continue beta-blocker.    Rogue Pietro, MD

## 2024-05-08 ENCOUNTER — Ambulatory Visit: Admitting: Behavioral Health

## 2024-05-08 ENCOUNTER — Encounter: Payer: Self-pay | Admitting: Behavioral Health

## 2024-05-08 DIAGNOSIS — F411 Generalized anxiety disorder: Secondary | ICD-10-CM
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
## 2024-05-13 ENCOUNTER — Ambulatory Visit: Admitting: Cardiology

## 2024-05-13 ENCOUNTER — Encounter: Payer: Self-pay | Admitting: Cardiology

## 2024-05-13 VITALS — BP 120/62 | HR 71 | Ht 70.0 in | Wt 172.0 lb

## 2024-05-13 DIAGNOSIS — I351 Nonrheumatic aortic (valve) insufficiency: Secondary | ICD-10-CM | POA: Diagnosis not present

## 2024-05-13 DIAGNOSIS — R002 Palpitations: Secondary | ICD-10-CM

## 2024-05-13 DIAGNOSIS — I712 Thoracic aortic aneurysm, without rupture, unspecified: Secondary | ICD-10-CM | POA: Diagnosis not present

## 2024-05-13 NOTE — Patient Instructions (Signed)
   Testing/Procedures:  CTA OF THE CHEST AT [facility] IMAGING DEPARTMENT  Follow-Up: At [facility], you and your health needs are our priority.  As part of our continuing mission to provide you with exceptional heart care, our providers are all part of one team.  This team includes your primary Cardiologist (physician) and Advanced Practice Providers or APPs (Physician Assistants and Nurse Practitioners) who all work together to provide you with the care you need, when you need it.  Your next appointment:   12 month(s)  Provider:   Redell Shallow, MD

## 2024-05-14 ENCOUNTER — Other Ambulatory Visit: Payer: Self-pay | Admitting: Family Medicine

## 2024-05-14 DIAGNOSIS — E785 Hyperlipidemia, unspecified: Secondary | ICD-10-CM

## 2024-05-14 DIAGNOSIS — I10 Essential (primary) hypertension: Secondary | ICD-10-CM

## 2024-05-14 DIAGNOSIS — R739 Hyperglycemia, unspecified: Secondary | ICD-10-CM

## 2024-05-14 NOTE — Telephone Encounter (Signed)
 Called patient he is scheduled for 05-16-24

## 2024-05-14 NOTE — Telephone Encounter (Signed)
 Pls contact pt to schedule appt for labs and Mood medication refills. Thanks

## 2024-05-16 ENCOUNTER — Telehealth: Payer: Self-pay

## 2024-05-16 ENCOUNTER — Ambulatory Visit (INDEPENDENT_AMBULATORY_CARE_PROVIDER_SITE_OTHER): Admitting: Family Medicine

## 2024-05-16 ENCOUNTER — Encounter: Payer: Self-pay | Admitting: Family Medicine

## 2024-05-16 VITALS — BP 130/57 | HR 42 | Ht 70.0 in | Wt 171.0 lb

## 2024-05-16 DIAGNOSIS — R7303 Prediabetes: Secondary | ICD-10-CM

## 2024-05-16 DIAGNOSIS — R972 Elevated prostate specific antigen [PSA]: Secondary | ICD-10-CM

## 2024-05-16 DIAGNOSIS — I10 Essential (primary) hypertension: Secondary | ICD-10-CM | POA: Diagnosis not present

## 2024-05-16 DIAGNOSIS — I351 Nonrheumatic aortic (valve) insufficiency: Secondary | ICD-10-CM | POA: Diagnosis not present

## 2024-05-16 DIAGNOSIS — Z125 Encounter for screening for malignant neoplasm of prostate: Secondary | ICD-10-CM | POA: Diagnosis not present

## 2024-05-16 DIAGNOSIS — R739 Hyperglycemia, unspecified: Secondary | ICD-10-CM

## 2024-05-16 DIAGNOSIS — E785 Hyperlipidemia, unspecified: Secondary | ICD-10-CM | POA: Diagnosis not present

## 2024-05-16 DIAGNOSIS — R4586 Emotional lability: Secondary | ICD-10-CM

## 2024-05-16 MED ORDER — SIMVASTATIN 20 MG PO TABS: 20.0000 mg | ORAL_TABLET | Freq: Every day | ORAL | 1 refills | Status: AC

## 2024-05-16 MED ORDER — METOPROLOL SUCCINATE ER 25 MG PO TB24: 25.0000 mg | ORAL_TABLET | Freq: Every day | ORAL | 1 refills | Status: DC

## 2024-05-16 MED ORDER — LOSARTAN POTASSIUM 100 MG PO TABS: 100.0000 mg | ORAL_TABLET | Freq: Every day | ORAL | 1 refills | Status: DC

## 2024-05-16 NOTE — Telephone Encounter (Signed)
 Copied from CRM #[id number]. Topic: Clinical - Prescription Issue >> May 16, 2024  1:56 PM Antony RAMAN wrote: Reason for CRM: [facility], I told [facility] them too. he wanted to avoid duplicates, please advise

## 2024-05-16 NOTE — Progress Notes (Signed)
 Nicholas Murillo - 78 y.o. male MRN [id number]  Date of birth: 1945-12-07  Subjective Chief Complaint  Patient presents with   Mood    HPI Nicholas Murillo is a 78 y.o. male here today for follow-up visit.  Reports overall he is doing pretty well.  He is seeing a therapist which he is found to be helpful for his anxiety.  Remains on losartan  and metoprolol .  He is seeing cardiology as well.  Plan to repeat echocardiogram in February for aortic insufficiency.  Tolerating statin well for associated hyperlipidemia.  He denies chest pain, shortness of breath, palpitations, headaches or vision changes.  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   COLONOSCOPY  07/16/2018   Dr.Perry   POLYPECTOMY     UPPER GASTROINTESTINAL ENDOSCOPY      Social History   Socioeconomic History   Marital status: Significant Other    Spouse name: Macario   Number of children: 2   Years of education: 18   Highest education level: Master's degree (e.g., MA, MS, MEng, MEd, MSW, MBA)  Occupational History   Occupation: [employer]    Comment: retired  Tobacco Use   Smoking status: Former   Smokeless tobacco: Never  Advertising Account Planner   Vaping status: Never Used  Substance and Sexual Activity   Alcohol use: No    Comment: quit 20 years ago   Drug use: No    Comment: quit 1994 (cocaine,pot,anything could get hands on)   Sexual activity: Yes  Other Topics Concern   Not on file  Social History  Narrative   Lives with his significant other. He has two children. He enjoys reading and stock market.   Social Drivers of Corporate Investment Banker Strain: Low Risk  (08/14/2023)   Overall Financial Resource Strain (CARDIA)    Difficulty of Paying Living Expenses: Not very hard  Food Insecurity: No Food Insecurity (08/14/2023)   Hunger Vital Sign    Worried About Running Out of Food in the Last Year: Never true    Ran Out of Food in the Last Year: Never true  Transportation Needs: No Transportation Needs (08/14/2023)   PRAPARE - Administrator, Civil Service (Medical): No    Lack of Transportation (Non-Medical): No  Physical Activity: Sufficiently Active (08/14/2023)   Exercise Vital Sign    Days of Exercise per Week: 5 days    Minutes of Exercise per Session: 30 min  Stress: Stress Concern Present (08/14/2023)   [employer] of Occupational Health - Occupational Stress Questionnaire    Feeling of Stress : To some extent  Social Connections: Moderately Isolated (08/14/2023)   Social Connection and Isolation Panel    Frequency of Communication with Friends and Family: Once a week    Frequency of Social Gatherings with Friends and Family: More than three times a week  Attends Religious Services: Never    Active Member of Clubs or Organizations: No    Attends Banker Meetings: Never    Marital Status: Living with partner    Family History  Problem Relation Age of Onset   Skin cancer Father    Colon polyps Mother        benign   Dementia Mother    Colon cancer Neg Hx    Stomach cancer Neg Hx    Esophageal cancer Neg Hx    Rectal cancer Neg Hx     Health Maintenance  Topic Date Due   Zoster Vaccines- Shingrix (1 of 2) 08/16/2024 (Originally 11/13/1964)   Medicare Annual Wellness (AWV)  08/13/2024   COVID-19 Vaccine (8 - Pfizer risk 2025-26 season) 09/29/2024   DTaP/Tdap/Td (3 - Td or Tdap) 12/12/2031   Pneumococcal Vaccine: 50+ Years  Completed    Influenza Vaccine  Completed   Hepatitis C Screening  Completed   Meningococcal B Vaccine  Aged Out   Colonoscopy  Discontinued   Fecal DNA (Cologuard)  Discontinued     ----------------------------------------------------------------------------------------------------------------------------------------------------------------------------------------------------------------- Physical Exam BP (!) 130/57 (BP Location: Left Arm, Patient Position: Sitting, Cuff Size: Normal)   Pulse (!) 42   Ht 5' 10 (1.778 m)   Wt 171 lb (77.6 kg)   SpO2 96%   BMI 24.54 kg/m   Physical Exam Constitutional:      Appearance: Normal appearance.  HENT:     Head: Normocephalic and atraumatic.  Eyes:     General: No scleral icterus. Cardiovascular:     Rate and Rhythm: Normal rate and regular rhythm.  Pulmonary:     Effort: Pulmonary effort is normal.     Breath sounds: Normal breath sounds.  Musculoskeletal:     Cervical back: Neck supple.  Neurological:     Mental Status: He is alert.  Psychiatric:        Mood and Affect: Mood normal.        Behavior: Behavior normal.     ------------------------------------------------------------------------------------------------------------------------------------------------------------------------------------------------------------------- Assessment and Plan  Hyperlipidemia Tolerating simvastatin  well.  Continue at current strength.  Repeating lipid panel.  Essential hypertension, benign Blood pressure is  well-controlled at this time.  Recommend continuation of current medications for management of hypertension.  Elevated PSA Update PSA levels.   Aortic valve regurgitation Follow-up cardiology.  Has repeat echocardiogram in February.  Mood altered Doing well with therapist.  He will continue to see Nicholas Murillo regularly.   Meds ordered this encounter  Medications   losartan  (COZAAR ) 100 MG tablet    Sig: Take 1 tablet (100 mg total) by  mouth daily.    Dispense:  90 tablet    Refill:  1   metoprolol  succinate (TOPROL -XL) 25 MG 24 hr tablet    Sig: Take 1 tablet (25 mg total) by mouth daily.    Dispense:  90 tablet    Refill:  1   simvastatin  (ZOCOR ) 20 MG tablet    Sig: Take 1 tablet (20 mg total) by mouth daily.    Dispense:  90 tablet    Refill:  1    Return in about 6 months (around 11/13/2024) for Hypertension.

## 2024-05-16 NOTE — Assessment & Plan Note (Signed)
Tolerating simvastatin well.  Continue at current strength.  Repeating lipid panel.

## 2024-05-16 NOTE — Assessment & Plan Note (Signed)
Update PSA levels. °

## 2024-05-16 NOTE — Assessment & Plan Note (Signed)
Blood pressure is well-controlled at this time.  Recommend continuation of current medications for management of hypertension.   

## 2024-05-16 NOTE — Assessment & Plan Note (Signed)
 Doing well with therapist.  He will continue to see Lorrene regularly.

## 2024-05-16 NOTE — Assessment & Plan Note (Signed)
 Follow-up cardiology.  Has repeat echocardiogram in February.

## 2024-05-17 LAB — LIPID PANEL WITH LDL/HDL RATIO
Cholesterol, Total: 136 mg/dL (ref 100–199)
HDL: 38 mg/dL — ABNORMAL LOW (ref >39)
LDL Chol Calc (NIH): 73 mg/dL (ref 0–99)
LDL/HDL Ratio: 1.9 ratio (ref 0.0–3.6)
Triglycerides: 143 mg/dL (ref 0–149)
VLDL Cholesterol Cal: 25 mg/dL (ref 5–40)

## 2024-05-17 LAB — CBC
Hematocrit: 48.3 % (ref 37.5–51.0)
Hemoglobin: 15.8 g/dL (ref 13.0–17.7)
MCH: 31.7 pg (ref 26.6–33.0)
MCHC: 32.7 g/dL (ref 31.5–35.7)
MCV: 97 fL (ref 79–97)
Platelets: 132 x10E3/uL — ABNORMAL LOW (ref 150–450)
RBC: 4.98 x10E6/uL (ref 4.14–5.80)
RDW: 11.9 % (ref 11.6–15.4)
WBC: 6.6 x10E3/uL (ref 3.4–10.8)

## 2024-05-17 LAB — CMP14+EGFR
ALT: 18 IU/L (ref 0–44)
AST: 24 IU/L (ref 0–40)
Albumin: 4.2 g/dL (ref 3.8–4.8)
Alkaline Phosphatase: 67 IU/L (ref 47–123)
BUN/Creatinine Ratio: 25 — ABNORMAL HIGH (ref 10–24)
BUN: 26 mg/dL (ref 8–27)
Bilirubin Total: 0.8 mg/dL (ref 0.0–1.2)
CO2: 24 mmol/L (ref 20–29)
Calcium: 9.3 mg/dL (ref 8.6–10.2)
Chloride: 104 mmol/L (ref 96–106)
Creatinine, Ser: 1.03 mg/dL (ref 0.76–1.27)
Globulin, Total: 2.2 g/dL (ref 1.5–4.5)
Glucose: 93 mg/dL (ref 70–99)
Potassium: 4.9 mmol/L (ref 3.5–5.2)
Sodium: 139 mmol/L (ref 134–144)
Total Protein: 6.4 g/dL (ref 6.0–8.5)
eGFR: 74 mL/min/1.73 (ref >59)

## 2024-05-17 LAB — HEMOGLOBIN A1C
Est. average glucose Bld gHb Est-mCnc: 123 mg/dL
Hgb A1c MFr Bld: 5.9 % — ABNORMAL HIGH (ref 4.8–5.6)

## 2024-05-17 LAB — PSA: Prostate Specific Ag, Serum: 8.1 ng/mL — ABNORMAL HIGH (ref 0.0–4.0)

## 2024-05-17 NOTE — Telephone Encounter (Signed)
 Attempted call to patient - needing to ask if wanting to remove [facility] from his preferred pharmacy list and if he would like us  to cancel any remaining refills at this pharmacy for him  Left a voice mail message on patient home # requesting a return call.

## 2024-05-20 ENCOUNTER — Encounter: Payer: Self-pay | Admitting: Behavioral Health

## 2024-05-20 ENCOUNTER — Ambulatory Visit (INDEPENDENT_AMBULATORY_CARE_PROVIDER_SITE_OTHER): Admitting: Behavioral Health

## 2024-05-20 DIAGNOSIS — R454 Irritability and anger: Secondary | ICD-10-CM

## 2024-05-20 DIAGNOSIS — F411 Generalized anxiety disorder: Secondary | ICD-10-CM | POA: Diagnosis not present

## 2024-05-20 NOTE — Telephone Encounter (Signed)
 Spoke  with patient. He states he is going to go ahead an pick up refills today at [facility] but he himself will cancel any future refills and let them know of the pharmacy change.  [facility] to  - patient states this is the correct pharmacy for the future refills.
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
## 2024-05-27 ENCOUNTER — Ambulatory Visit

## 2024-05-27 DIAGNOSIS — I712 Thoracic aortic aneurysm, without rupture, unspecified: Secondary | ICD-10-CM

## 2024-05-27 DIAGNOSIS — I77810 Thoracic aortic ectasia: Secondary | ICD-10-CM | POA: Diagnosis not present

## 2024-05-27 DIAGNOSIS — I7121 Aneurysm of the ascending aorta, without rupture: Secondary | ICD-10-CM | POA: Diagnosis not present

## 2024-05-27 MED ORDER — IOHEXOL 350 MG/ML SOLN
100.0000 mL | Freq: Once | INTRAVENOUS | Status: AC | PRN
  Administered 2024-05-27: 100 mL via INTRAVENOUS

## 2024-06-02 ENCOUNTER — Ambulatory Visit: Payer: Self-pay | Admitting: Family Medicine

## 2024-06-02 ENCOUNTER — Ambulatory Visit: Payer: Self-pay | Admitting: Cardiology

## 2024-06-02 DIAGNOSIS — I712 Thoracic aortic aneurysm, without rupture, unspecified: Secondary | ICD-10-CM

## 2024-06-05 ENCOUNTER — Ambulatory Visit: Admitting: Behavioral Health

## 2024-06-05 ENCOUNTER — Encounter: Payer: Self-pay | Admitting: Behavioral Health

## 2024-06-05 DIAGNOSIS — F411 Generalized anxiety disorder: Secondary | ICD-10-CM
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
## 2024-06-15 ENCOUNTER — Other Ambulatory Visit: Payer: Self-pay | Admitting: Family Medicine

## 2024-06-15 DIAGNOSIS — I10 Essential (primary) hypertension: Secondary | ICD-10-CM

## 2024-06-17 ENCOUNTER — Encounter: Payer: Self-pay | Admitting: Behavioral Health

## 2024-06-17 ENCOUNTER — Ambulatory Visit: Admitting: Behavioral Health

## 2024-06-17 DIAGNOSIS — F411 Generalized anxiety disorder: Secondary | ICD-10-CM

## 2024-06-17 DIAGNOSIS — R454 Irritability and anger: Secondary | ICD-10-CM
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
## 2024-07-11 ENCOUNTER — Ambulatory Visit (HOSPITAL_COMMUNITY)
Admission: RE | Admit: 2024-07-11 | Discharge: 2024-07-11 | Disposition: A | Discharge status: Home / Self Care | Source: Ambulatory Visit | Attending: Cardiovascular Disease | Admitting: Cardiovascular Disease

## 2024-07-11 DIAGNOSIS — I351 Nonrheumatic aortic (valve) insufficiency: Secondary | ICD-10-CM | POA: Insufficient documentation

## 2024-07-11 LAB — ECHOCARDIOGRAM COMPLETE
Area-P 1/2: 2.6 cm2
S' Lateral: 3.04 cm

## 2024-07-12 ENCOUNTER — Ambulatory Visit: Admitting: Behavioral Health

## 2024-07-12 ENCOUNTER — Encounter: Payer: Self-pay | Admitting: Behavioral Health

## 2024-07-12 ENCOUNTER — Ambulatory Visit: Payer: Self-pay | Admitting: Cardiology

## 2024-07-12 DIAGNOSIS — F411 Generalized anxiety disorder: Secondary | ICD-10-CM | POA: Diagnosis not present
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
## 2024-07-22 ENCOUNTER — Ambulatory Visit: Admitting: Behavioral Health

## 2024-07-22 ENCOUNTER — Encounter: Payer: Self-pay | Admitting: Behavioral Health

## 2024-07-22 DIAGNOSIS — R454 Irritability and anger: Secondary | ICD-10-CM | POA: Diagnosis not present

## 2024-07-22 DIAGNOSIS — F411 Generalized anxiety disorder: Secondary | ICD-10-CM
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
## 2024-08-07 ENCOUNTER — Encounter: Payer: Self-pay | Admitting: Behavioral Health

## 2024-08-07 ENCOUNTER — Ambulatory Visit: Admitting: Behavioral Health

## 2024-08-07 DIAGNOSIS — F411 Generalized anxiety disorder: Secondary | ICD-10-CM | POA: Diagnosis not present

## 2024-08-07 DIAGNOSIS — R454 Irritability and anger: Secondary | ICD-10-CM

## 2024-08-07 NOTE — Progress Notes (Signed)
 "  [facility] Counselor/Therapist Progress Note  Patient ID: Nicholas Murillo, MRN: [id number],    Date: 08/07/2024  Time Spent: 208 PM until 3 PM, 52 minutes spent face-to-face with the patient in the outpatient therapist office.  Treatment Type: Individual Therapy  Reported Symptoms: Anxiety/stress, irritability Primarily today we looked at situations involving the patient's relationship with his brother and somewhat with his partner.  He is making some ground with helping his brother make better decisions but his brother is being influenced by people who live in the area near him in another state.  The patient is working with his niece and he feels the 2 of them are making headway but is very frustrating that his brother is contemplating decisions financially and otherwise it would not be good for him.  He also is trying to be proactive.  For years he has tried to get arrangements drawn up for taking care of he and his partner and says he finally her to agree to meet with an attorney which they did and now the paperwork is drawn up but he is having a hard time getting her to sign it.  We looked at why she might be hesitant in some different ways that he might resent that to her so that he can get that completed.  He says that is a weight on him that he knows would make things very clear long-term when they can get it signed. He does contract for safety having no thoughts of hurting himself or anyone else.  Mental Status Exam: Appearance:  Well Groomed     Behavior: Appropriate  Motor: Normal  Speech/Language:  Normal Rate  Affect: Appropriate  Mood: normal  Thought process: normal  Thought content:   WNL  Sensory/Perceptual disturbances:   WNL  Orientation: oriented to person, place, time/date, situation, day of week, month of year, and year  Attention: Good  Concentration: Good  Memory: WNL  Fund of knowledge:  Good  Insight:   Good  Judgment:  Good  Impulse Control:  Good   Risk Assessment: Danger to Self:  No Self-injurious Behavior: No Danger to Others: No Duty to Warn:no Physical Aggression / Violence:No  Access to Firearms a concern: No  Gang Involvement:No   Subjective: The patient's biggest frustration comes from working with his brother.  His brother does have dementia but is still in the early stages but is not making good decisions for himself.  Patient says he becomes very fearful easily of doctors financial institutions.  His brother is allowing other people in his life to influence him.  The patient and his brothers daughter who live states away from the brother are doing all they can to support him and encouraged him and allow them to be a bigger part of his life.  He has finally acknowledged the fact that he has dementia but is not willing to treat it.  He wanted the patient to pay some bills financially but is not willing to go through the electronic process of allowing his brother to do that.  The patient and his niece are trying to work on weight and to see what the atmosphere may be in his mother's home right now.  He said they talk regularly and it is exhausting because his brother is always negative.  The patient said that his brother has been dying for about 10 years along continues.  He tries to stay as positive as he can but he said is beginning to become  emotionally and intellectually exhausting because of his brothers refusal to except his and his help.  I encouraged him to set clear boundaries and recognize what he cannot change.  He did say he has been much more mindful of his anger and has become less frequent and less intense and he feels that he is making progress.  He does contract for safety having no thoughts of hurting himself or anyone else.  Interventions: Cognitive Behavioral Therapy  Diagnosis: Generalized anxiety disorder,   Plan: We will use cognitive behavioral therapy as well as elements of dialectical behavior  therapy to reduce his anxiety and irritability by at least 50% with a target date of December 31, 2024.  Goals for reducing anxiety will be to improve his ability to manage anxiety symptoms thoughts and better handle stress.  We will continue to unpack causes for anxiety and explore ways to lower it as well as resolving core conflicts contributing to the causes of anxiety and stress.  Most importantly will help him manage thoughts and worrisome thinking contributing to his anxiety.  Interventions include providing education about anxiety to help him understand its causes symptoms and triggers as well as facilitating problem solution skills and anxiety symptom management skills.  We will use cognitive behavioral therapy to identify and change anxiety provoking thought and behavior patterns as well as elements of dialectical behavior therapy distress tolerance and mindfulness skills.  Goals for reducing irritability and anger including helping him develop an awareness of irritable thoughts and behaviors contributing to frustration as well as learning healthier ways to handle angry thoughts and feeling in constructive ways.  Mentions include teaching mindfulness skills to help him see and feel through situations clearly finding a balance of intellect and emotion.  We will use cognitive behavioral therapy to help him identify negative consequences and how he expresses anger.  We will work to explore and replace unhealthy thought and behavior patterns contributing to feelings of irritability including identifying core values that may not be met that are contributing to the feelings of anger.  We will look at how the patient had irritability frustration and emotional expression modeled for him and how that conflicts with intellectual thing.  We will work on teaching assertive communication skills as well as teaching dialectical behavior therapy self soothing skills.  Lorrene CHRISTELLA Hasten,  [facility]                  Lorrene CHRISTELLA Hasten, [facility]               Lorrene CHRISTELLA Hasten, [facility]               Lorrene CHRISTELLA Hasten, [facility]               Lorrene CHRISTELLA Hasten, [facility]               Lorrene CHRISTELLA Hasten, [facility]               Lorrene CHRISTELLA Hasten, [facility]               Lorrene CHRISTELLA Hasten, [facility]               Lorrene CHRISTELLA Hasten, [facility]               Lorrene CHRISTELLA Hasten, [facility]               Lorrene CHRISTELLA Hasten, [facility]               Lorrene CHRISTELLA  [place], [facility]               Lorrene CHRISTELLA [place], [facility]               Lorrene CHRISTELLA [place]ma, [facility]               Lorrene CHRISTELLA [place], [facility]               Lorrene CHRISTELLA Zulema, [facility] "

## 2024-08-14 ENCOUNTER — Ambulatory Visit: Payer: Medicare Other

## 2024-08-20 ENCOUNTER — Ambulatory Visit: Admitting: Behavioral Health

## 2024-09-05 ENCOUNTER — Ambulatory Visit: Admitting: Behavioral Health

## 2024-09-23 ENCOUNTER — Ambulatory Visit: Admitting: Cardiology
